# Patient Record
Sex: Male | Born: 1935 | Race: Black or African American | Hispanic: No | Smoking: Former smoker
Health system: Southern US, Community
[De-identification: ages and names within clinical notes are randomized; demographics above are authoritative.]

## PROBLEM LIST (undated history)

## (undated) DIAGNOSIS — M810 Age-related osteoporosis without current pathological fracture: Secondary | ICD-10-CM

## (undated) DIAGNOSIS — E785 Hyperlipidemia, unspecified: Secondary | ICD-10-CM

## (undated) DIAGNOSIS — F0390 Unspecified dementia without behavioral disturbance: Secondary | ICD-10-CM

## (undated) DIAGNOSIS — M6281 Muscle weakness (generalized): Secondary | ICD-10-CM

## (undated) DIAGNOSIS — J449 Chronic obstructive pulmonary disease, unspecified: Secondary | ICD-10-CM

## (undated) DIAGNOSIS — I739 Peripheral vascular disease, unspecified: Secondary | ICD-10-CM

## (undated) DIAGNOSIS — I10 Essential (primary) hypertension: Secondary | ICD-10-CM

## (undated) DIAGNOSIS — I639 Cerebral infarction, unspecified: Secondary | ICD-10-CM

## (undated) DIAGNOSIS — Z89519 Acquired absence of unspecified leg below knee: Secondary | ICD-10-CM

## (undated) DIAGNOSIS — D649 Anemia, unspecified: Secondary | ICD-10-CM

## (undated) DIAGNOSIS — R531 Weakness: Secondary | ICD-10-CM

## (undated) DIAGNOSIS — F0280 Dementia in other diseases classified elsewhere without behavioral disturbance: Secondary | ICD-10-CM

## (undated) DIAGNOSIS — I701 Atherosclerosis of renal artery: Secondary | ICD-10-CM

## (undated) DIAGNOSIS — N184 Chronic kidney disease, stage 4 (severe): Secondary | ICD-10-CM

## (undated) DIAGNOSIS — R1310 Dysphagia, unspecified: Secondary | ICD-10-CM

## (undated) DIAGNOSIS — R627 Adult failure to thrive: Secondary | ICD-10-CM

## (undated) DIAGNOSIS — H409 Unspecified glaucoma: Secondary | ICD-10-CM

## (undated) DIAGNOSIS — G309 Alzheimer's disease, unspecified: Secondary | ICD-10-CM

## (undated) HISTORY — PX: LEG AMPUTATION BELOW KNEE: SHX694

---

## 2005-05-13 ENCOUNTER — Ambulatory Visit (HOSPITAL_COMMUNITY): Admission: RE | Admit: 2005-05-13 | Discharge: 2005-05-13 | Payer: Self-pay | Admitting: Internal Medicine

## 2005-06-03 ENCOUNTER — Ambulatory Visit: Payer: Self-pay | Admitting: Internal Medicine

## 2005-06-09 ENCOUNTER — Ambulatory Visit: Admission: RE | Admit: 2005-06-09 | Discharge: 2005-06-09 | Payer: Self-pay | Admitting: Internal Medicine

## 2005-06-14 ENCOUNTER — Ambulatory Visit: Payer: Self-pay

## 2005-07-08 ENCOUNTER — Ambulatory Visit: Payer: Self-pay | Admitting: Internal Medicine

## 2005-07-16 ENCOUNTER — Ambulatory Visit: Payer: Self-pay | Admitting: Internal Medicine

## 2005-07-16 ENCOUNTER — Inpatient Hospital Stay (HOSPITAL_BASED_OUTPATIENT_CLINIC_OR_DEPARTMENT_OTHER): Admission: RE | Admit: 2005-07-16 | Discharge: 2005-07-16 | Payer: Self-pay | Admitting: Internal Medicine

## 2005-07-27 ENCOUNTER — Ambulatory Visit: Payer: Self-pay | Admitting: Internal Medicine

## 2005-08-09 ENCOUNTER — Ambulatory Visit: Payer: Self-pay | Admitting: Internal Medicine

## 2005-10-14 ENCOUNTER — Inpatient Hospital Stay (HOSPITAL_COMMUNITY): Admission: RE | Admit: 2005-10-14 | Discharge: 2005-10-19 | Payer: Self-pay | Admitting: Orthopedic Surgery

## 2005-11-08 ENCOUNTER — Inpatient Hospital Stay (HOSPITAL_COMMUNITY): Admission: EM | Admit: 2005-11-08 | Discharge: 2005-11-26 | Payer: Self-pay | Admitting: Emergency Medicine

## 2005-12-13 ENCOUNTER — Encounter (HOSPITAL_BASED_OUTPATIENT_CLINIC_OR_DEPARTMENT_OTHER): Admission: RE | Admit: 2005-12-13 | Discharge: 2006-03-13 | Payer: Self-pay | Admitting: Surgery

## 2006-01-14 ENCOUNTER — Inpatient Hospital Stay (HOSPITAL_COMMUNITY): Admission: AD | Admit: 2006-01-14 | Discharge: 2006-01-24 | Payer: Self-pay | Admitting: Orthopedic Surgery

## 2006-01-21 ENCOUNTER — Encounter (INDEPENDENT_AMBULATORY_CARE_PROVIDER_SITE_OTHER): Payer: Self-pay | Admitting: Specialist

## 2006-01-21 DIAGNOSIS — Z89519 Acquired absence of unspecified leg below knee: Secondary | ICD-10-CM

## 2006-01-21 HISTORY — DX: Acquired absence of unspecified leg below knee: Z89.519

## 2006-02-14 ENCOUNTER — Emergency Department (HOSPITAL_COMMUNITY): Admission: EM | Admit: 2006-02-14 | Discharge: 2006-02-14 | Payer: Self-pay | Admitting: Emergency Medicine

## 2007-03-29 IMAGING — CR DG CHEST 2V
1 series · 1 of 1 positions shown · non-contrast
Comparison: 10/13/2005

CLINICAL DATA: Chest pain, confusion

CHEST - 2 VIEW:

[view not recorded]
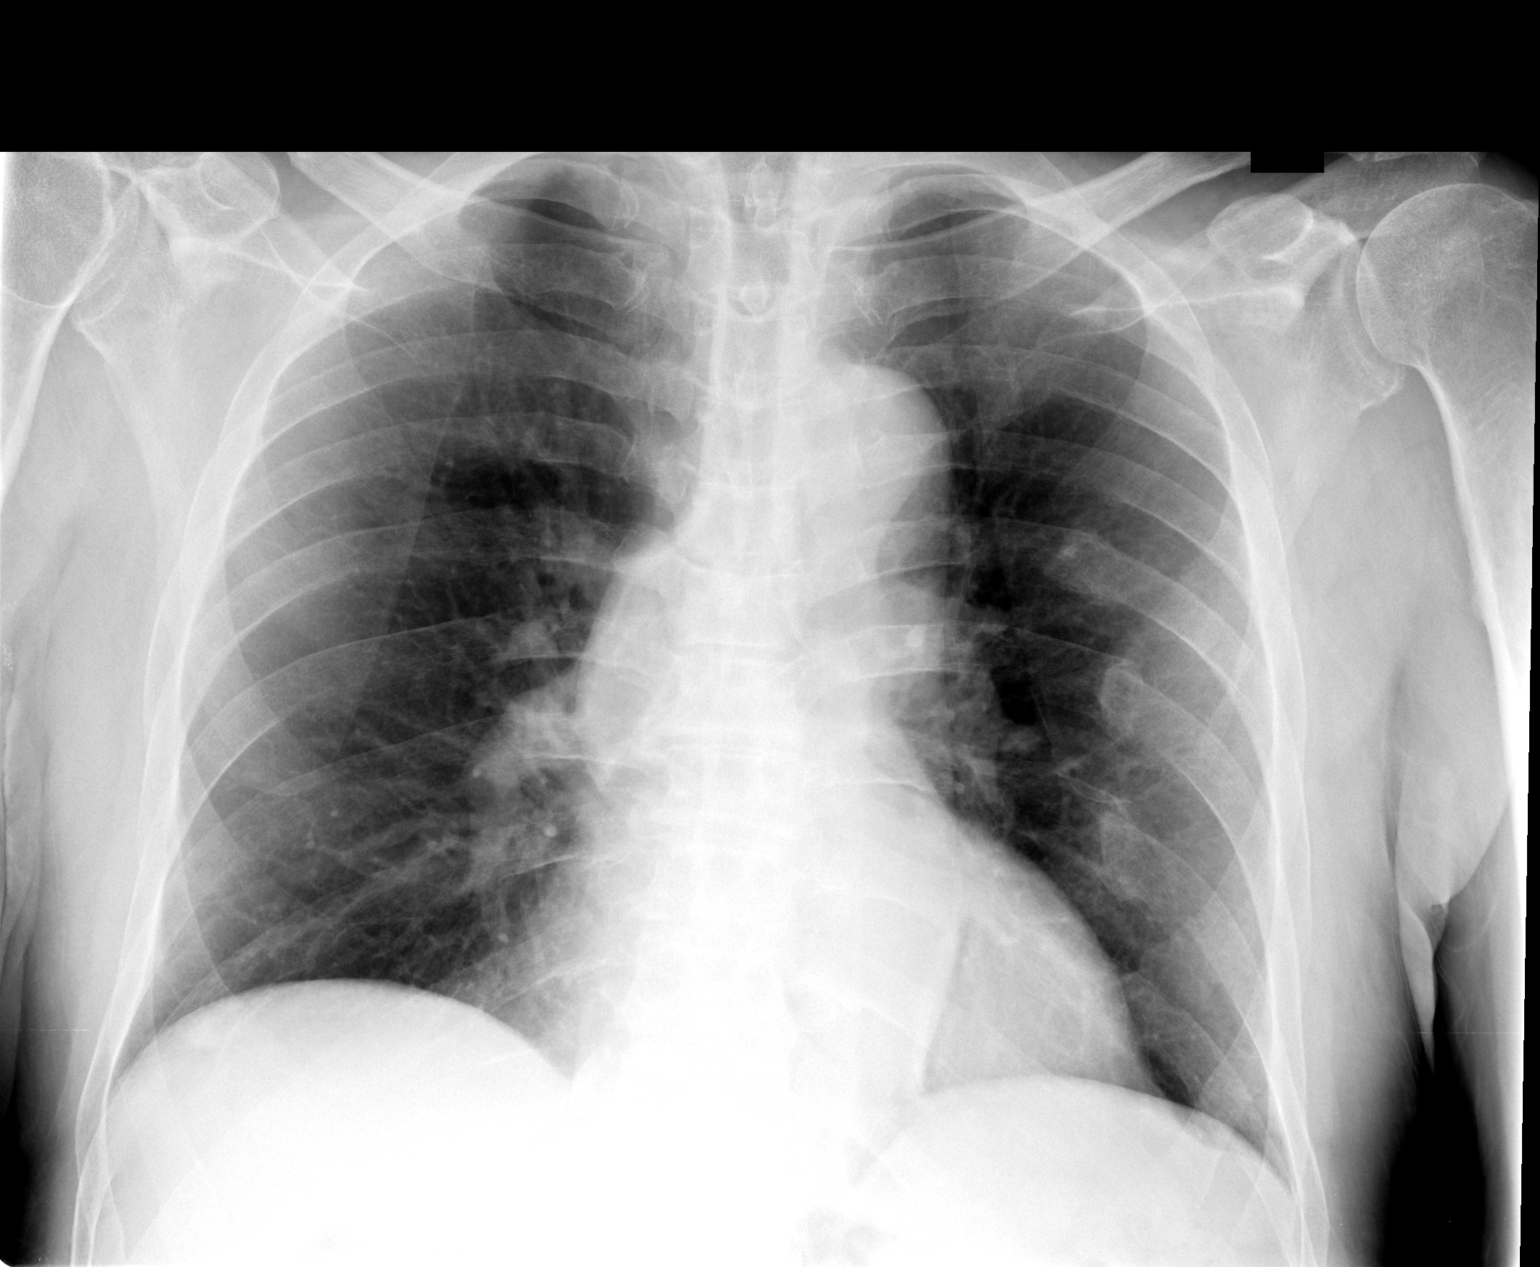

[1 of 1 positions shown; findings below may reference images not displayed]

FINDINGS: Heart is borderline enlarged. Mild tortuosity of the thoracic aorta.
No focal airspace opacities or effusions. Old left rib fractures.
IMPRESSION: Borderline cardiomegaly. No active disease.

## 2007-03-31 IMAGING — CR DG ABD PORTABLE 1V
1 series · 1 of 1 positions shown · non-contrast
Comparison: none

CLINICAL DATA: Panda placement. 
 ABDOMEN - [id number] ? 11/10/05:

[view not recorded]
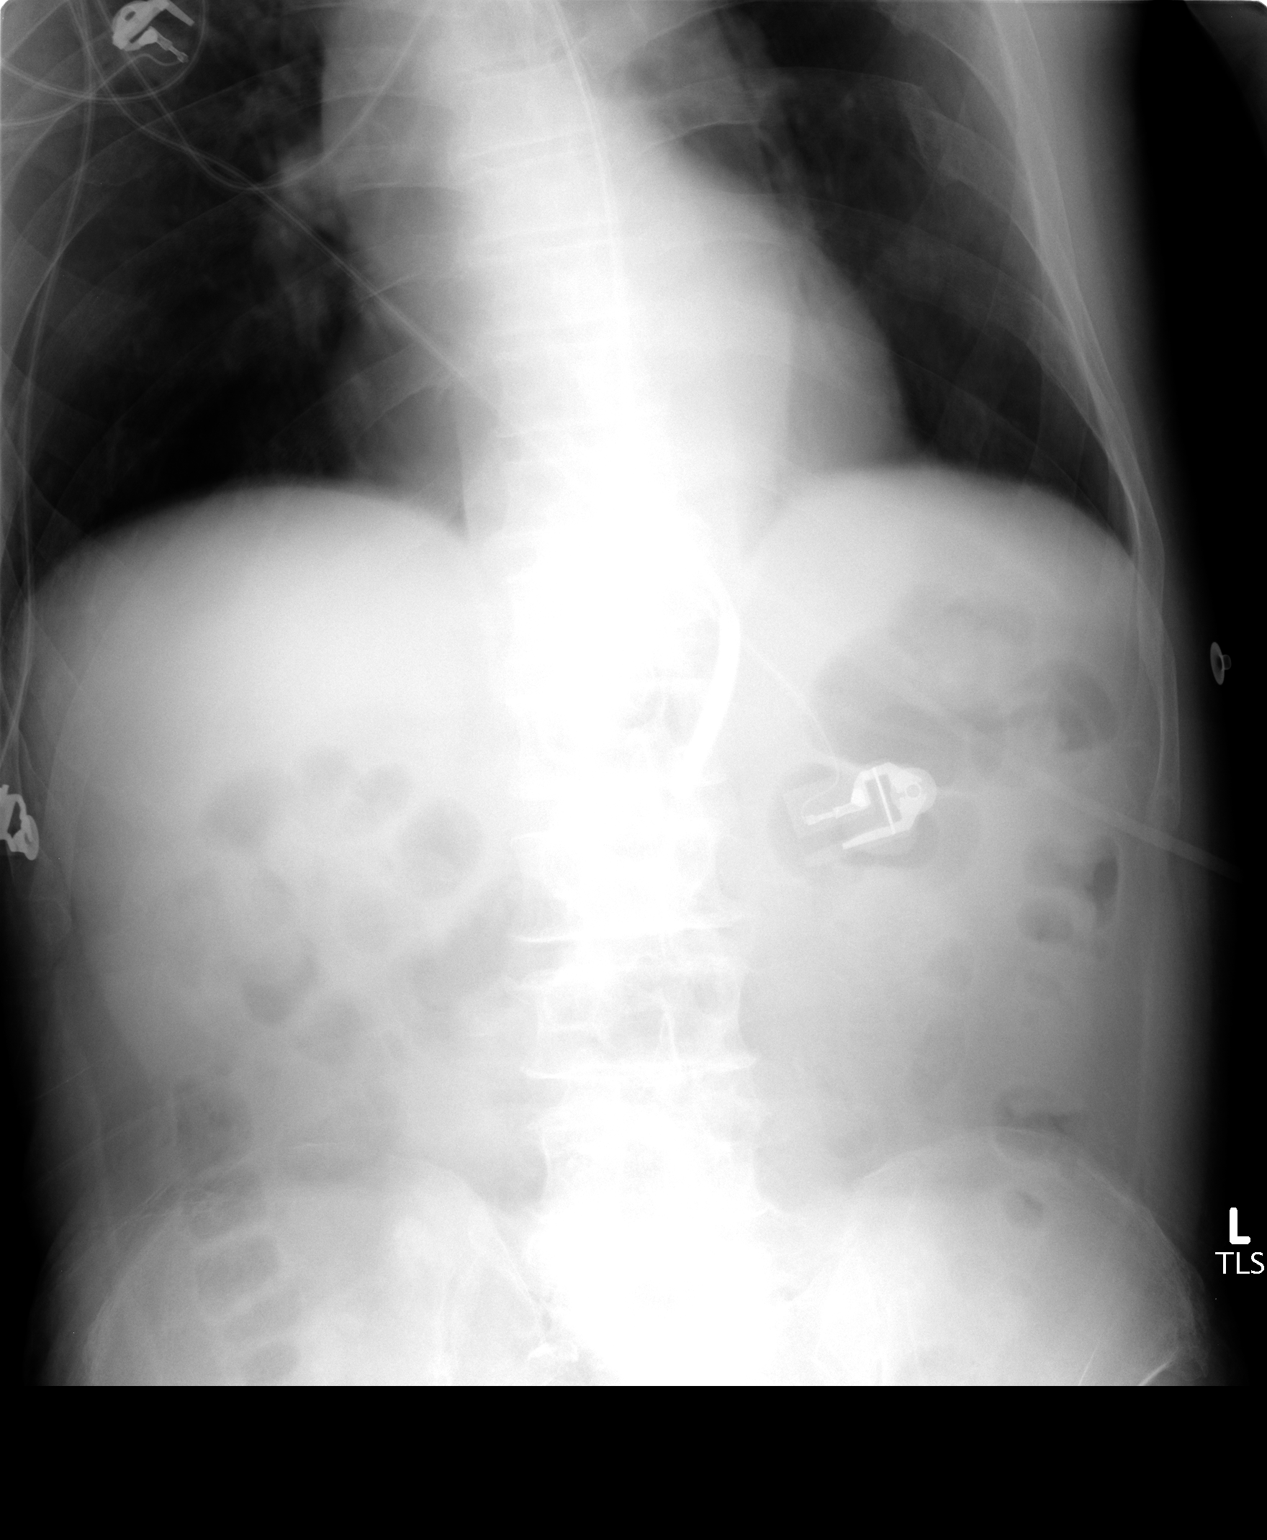

[1 of 1 positions shown; findings below may reference images not displayed]

FINDINGS: A panda tube is in place with the tip in the stomach. The tip is directed toward the duodenum.
IMPRESSION: As above.

## 2009-07-11 ENCOUNTER — Emergency Department (HOSPITAL_COMMUNITY): Admission: EM | Admit: 2009-07-11 | Discharge: 2009-07-12 | Payer: Self-pay | Admitting: Emergency Medicine

## 2010-04-11 ENCOUNTER — Emergency Department (HOSPITAL_COMMUNITY): Admission: EM | Admit: 2010-04-11 | Discharge: 2010-04-12 | Payer: Self-pay | Admitting: Emergency Medicine

## 2011-01-25 LAB — URINALYSIS, ROUTINE W REFLEX MICROSCOPIC
Bilirubin Urine: NEGATIVE
Glucose, UA: NEGATIVE mg/dL
Hgb urine dipstick: NEGATIVE
Ketones, ur: NEGATIVE mg/dL
Nitrite: NEGATIVE
Protein, ur: NEGATIVE mg/dL
Specific Gravity, Urine: 1.022 (ref 1.005–1.030)
Urobilinogen, UA: 0.2 mg/dL (ref 0.0–1.0)
pH: 6.5 (ref 5.0–8.0)

## 2011-01-25 LAB — BASIC METABOLIC PANEL WITH GFR
BUN: 27 mg/dL — ABNORMAL HIGH (ref 6–23)
Calcium: 9.4 mg/dL (ref 8.4–10.5)
Chloride: 111 meq/L (ref 96–112)
Creatinine, Ser: 1.76 mg/dL — ABNORMAL HIGH (ref 0.4–1.5)
GFR calc Af Amer: 45 mL/min — ABNORMAL LOW (ref >60)
GFR calc non Af Amer: 37 mL/min — ABNORMAL LOW (ref >60)

## 2011-01-25 LAB — MAGNESIUM: Magnesium: 2.1 mg/dL (ref 1.5–2.5)

## 2011-01-25 LAB — BASIC METABOLIC PANEL
CO2: 28 mEq/L (ref 19–32)
Glucose, Bld: 137 mg/dL — ABNORMAL HIGH (ref 70–99)
Potassium: 4.4 mEq/L (ref 3.5–5.1)
Sodium: 142 mEq/L (ref 135–145)

## 2011-01-25 LAB — URINE MICROSCOPIC-ADD ON

## 2011-02-12 LAB — COMPREHENSIVE METABOLIC PANEL
AST: 21 U/L (ref 0–37)
Albumin: 3 g/dL — ABNORMAL LOW (ref 3.5–5.2)
Alkaline Phosphatase: 53 U/L (ref 39–117)
BUN: 40 mg/dL — ABNORMAL HIGH (ref 6–23)
CO2: 29 mEq/L (ref 19–32)
Chloride: 114 mEq/L — ABNORMAL HIGH (ref 96–112)
Creatinine, Ser: 2.03 mg/dL — ABNORMAL HIGH (ref 0.4–1.5)
GFR calc Af Amer: 38 mL/min — ABNORMAL LOW (ref >60)
GFR calc non Af Amer: 32 mL/min — ABNORMAL LOW (ref >60)
Potassium: 4.4 mEq/L (ref 3.5–5.1)
Total Bilirubin: 0.5 mg/dL (ref 0.3–1.2)

## 2011-02-12 LAB — PROTIME-INR: Prothrombin Time: 14.2 seconds (ref 11.6–15.2)

## 2011-02-12 LAB — DIFFERENTIAL
Basophils Absolute: 0 10*3/uL (ref 0.0–0.1)
Basophils Relative: 0 % (ref 0–1)
Eosinophils Relative: 5 % (ref 0–5)
Monocytes Absolute: 0.5 10*3/uL (ref 0.1–1.0)

## 2011-02-12 LAB — CBC
HCT: 33.8 % — ABNORMAL LOW (ref 39.0–52.0)
MCV: 86.3 fL (ref 78.0–100.0)
Platelets: 195 10*3/uL (ref 150–400)
RBC: 3.91 MIL/uL — ABNORMAL LOW (ref 4.22–5.81)
WBC: 4.6 10*3/uL (ref 4.0–10.5)

## 2011-03-26 NOTE — Cardiovascular Report (Signed)
NAME:  Joseph Ponce, Joseph Ponce NO.:  [id number]   MEDICAL RECORD NO.:  [id number]          PATIENT TYPE:  OIB   LOCATION:  6501                         FACILITY:  [facility]   PHYSICIAN:  Arvilla Meres, M.D. LHCDATE OF BIRTH:  01-Oct-1924   DATE OF PROCEDURE:  07/16/2005  DATE OF DISCHARGE:                              CARDIAC CATHETERIZATION   PRIMARY CARE PHYSICIAN:  Barry Dienes. Eloise Harman, M.D.   CARDIOLOGIST:  Arvilla Meres, M.D.   PATIENT IDENTIFICATION:  Joseph Ponce is a very pleasant 77 year old male with  a history of hypertension and severe osteoarthritis who is pending right  knee replacement. He was referred for preoperative risk stratification.. I  saw him in the clinic, and he denied a history of known coronary disease.  Reportedly, he had a negative stress test several years ago prior to his hip  replacement. However, in the interim, he has developed some left-sided chest  pain which happens both at rest and with exertion. Thus, we performed a  Cardiolite in the office which showed an EF of 49%, and there was some  question of mild anterior ischemia. Thus, he is referred for a diagnostic  heart catheterization.   PROCEDURES PERFORMED:  1.  Selective coronary angiography.  2.  Left heart catheterization.  3.  Left ventriculogram.  4.  Abdominal aortogram.   DESCRIPTION OF PROCEDURE:  The risks and benefits of catheterization were  explained to Joseph Ponce. Consent was signed and placed on the chart. A 4-  French arterial sheath was placed in the right femoral artery. However,  given the marked tortuosity of the iliac system, we exchanged this for a  long sheath 5-French sheath. We were able to easily get up into the thoracic  aorta with a Wooley wire.  Subsequently, all catheter exchanges were made  over a high wire exchange. The left coronary system was imaged with a  standard JL-4.  Multiple catheters were tried for the right coronary;  however, given the  extreme tortuosity of the abdominal aortic iliac system,  there was very limited catheter manipulation. We finally were able to shoot  the right coronary artery nonselectively with the RCB catheter. A standard  angled pigtail was used for the ventriculogram. There were no apparent  complications. Central aortic pressure was 167/76 with a mean of 108.  The  LV pressure was 186/16 with an EDP of 26. There was no significant gradient  across the aortic valve.   Left main was long, angiographically normal.   The LAD was a long vessel which wrapped the apex. It gave off two diagonals.  There was no angiographic coronary disease.   The left circumflex was a tortuous vessel. It gave off a large branching  OM1. There was a 40% proximal lesion followed by a 30% lesion in the mid-  section.   The right coronary artery was shot nonselectively.  It gave off a small RV  branch, a small acute marginal branch, and a small to moderate size PDA.  There was no angiographic CAD.   Left ventriculogram done in the RAO approach showed  an ejection fraction of  50% with no wall motion abnormalities and no significant mitral  regurgitation.   Abdominal aorta was markedly tortuous with tortuosity extending into the  iliac system. There was an apparent 25% stenosis in the right renal artery  and no significant aortoiliac plaquing.   ASSESSMENT:  1.  Minimal nonobstructive coronary artery disease.  2.  Low normal ejection fraction with increased filling pressures.  3.  Poorly-controlled hypertension.  4.  Very tortuous abdominal aortic - iliac system with a 25% right renal      artery stenosis.   PLAN:  Continue with medical therapy and with aggressive control of his  blood pressure. To this extent, we will increase his Norvasc to 10 mg a day,  and I will see him back in the office for further titration of his  antihypertensive regimen. This is also followed by Dr. Eloise Harman. Given his  lack of  significant coronary disease, he appears to be at low risk for  cardiovascular complications with his upcoming surgery and does not need any  further cardiac testing.      Arvilla Meres, M.D. [facility]  Electronically Signed     DB/MEDQ  D:  07/16/2005  T:  07/16/2005  Job:  [id number]   cc:   Barry Dienes. Eloise Harman, M.D.  Fax: [phone]

## 2011-03-26 NOTE — Op Note (Signed)
NAME:  [name], [name] NO.:  [id number]   MEDICAL RECORD NO.:  [id number]          PATIENT TYPE:  INP   LOCATION:  5009                         FACILITY:  [facility]   PHYSICIAN:  Burnard Bunting, M.D.    DATE OF BIRTH:  1924/06/17   DATE OF PROCEDURE:  01/21/2006  DATE OF DISCHARGE:  01/24/2006                                 OPERATIVE REPORT   PREOPERATIVE DIAGNOSIS:  Left foot ischemia and calcaneal osteomyelitis.   POSTOPERATIVE DIAGNOSIS:  Left foot ischemia and calcaneal osteomyelitis.   OPERATION PERFORMED:  Left below-knee amputation.   SURGEON:  Burnard Bunting, M.D.   ASSISTANT:  None.   ANESTHESIA:  General endotracheal.   ESTIMATED BLOOD LOSS:  75 mL.   DRAINS:  None.   TOURNIQUET TIME:  34 minutes at 300 mmHg.   INDICATIONS FOR PROCEDURE:  Joseph Ponce is an 77 year old patient with  calcaneal osteomyelitis, who presents for left calcaneal osteomyelitis and  severe peripheral vascular disease, who presents for left below-knee  amputation.  The risks and benefits were discussed with the patient.   DESCRIPTION OF PROCEDURE:  The patient was brought to the operating room  where general endotracheal anesthesia was induced.  Preoperative antibiotics  were administered.  The left leg was prepped with DuraPrep solution and  draped in sterile manner.  Beginning about four fingerbreadths below the  tibial tubercle.  A circumferential incision made around the anterior aspect  of the lower leg.  The flap was carried distally.  Anterior compartment  muscles were then divided proximal to the skin incision by 1.5 cm  ___________ then divided 1 cm proximal to that with anterior flange.  Fibula  was also divided proximal to the tibial stump.  Using amputation knife, the  posterior structures were divided.  Neurovascular bundles were identified  and suture ligated x 3 with silk sutures.  The peroneal nerve and tibial  nerve were mobilized distally and cut proximally  with the electrocautery.  Tourniquet was released.  Bleeding points encountered and controlled using  electrocautery.  Some skin edge bleeding was  noted.  After thorough irrigation, the below-knee amputation stump was  closed using interrupted inverted 0 Vicryl suture to reapproximate the  fascial tissue, interrupted inverted 2-0 Vicryl to reapproximate the  subdermal layer and staples to reapproximate the skin.  The patient  tolerated the procedure well without immediate complications.           ______________________________  G. Dorene Grebe, M.D.     GSD/MEDQ  D:  03/10/2006  T:  03/10/2006  Job:  [id number]

## 2011-03-26 NOTE — H&P (Signed)
NAME:  Joseph Ponce, Joseph Ponce NO.:  [id number]   MEDICAL RECORD NO.:  [id number]          PATIENT TYPE:  INP   LOCATION:  5009                         FACILITY:  [facility]   PHYSICIAN:  Burnard Bunting, M.D.    DATE OF BIRTH:  29-Apr-1924   DATE OF ADMISSION:  01/14/2006  DATE OF DISCHARGE:                                HISTORY & PHYSICAL   CHIEF COMPLAINT:  Left foot infection.   HISTORY OF PRESENT ILLNESS:  Joseph Ponce is a 77 year old essentially non-  ambulatory patient, resident of [facility].  He underwent  right total knee replacement December 2006.  The patient has been non-  ambulatory since that time. He has had progressive ulceration of the left  heel and presents now with fevers and purulent discharge from the calcaneal  region.  The patient denies any other orthopedic complaints.   PAST MEDICAL HISTORY:  1.  Hypertension.  2.  Nonobstructive coronary artery disease with an EF of 49%.  3.  Right total knee replacement December 2006.  4.  Renal artery stenosis.  5.  Dementia.  6.  Chronic obstructive pulmonary disease.  7.  Glaucoma.   MEDICATIONS ON ADMISSION:  1.  MiraLax 17 g daily.  2.  Depakote 125 mg p.o. twice daily for mood stabilization.  3.  Prilosec 20 mg daily.  4.  Alphagan eye drops twice daily.  5.  Trusopt eye drops twice daily.  6.  Travatan eye drops nightly.  7.  Catapres 0.2 mg p.o. daily.  8.  Aspirin 81 mg p.o. daily.  9.  Norvasc 10 mg p.o. daily.  10. Zocor 20 mg daily.  11. Multivitamins daily.  12. Coumadin 1 mg p.o. daily.  13. Toprol XL 12.5 mg daily.   ALLERGIES:  No known drug allergies.   SOCIAL HISTORY:  The patient is a resident of [facility] since July 2005.  Next of kin and power of attorney is Gevena Mart  who lives in [place], telephone number [phone].   REVIEW OF SYSTEMS:  Notable for no chest pain or shortness of breath.  He  does report some fevers.   PHYSICAL EXAMINATION:  VITAL SIGNS:  Temperature 99, blood pressure 138/76,  pulse 76, respiratory rate 20, O2 saturation 99% on room air.  GENERAL:  The patient is in no acute distress.  Normal body mass index.  NECK:  No jugular venous distention or carotid bruits and HEENT exam.  HEART:  Regular rate and rhythm without murmurs.  CHEST: Clear to auscultation.  ABDOMEN: Benign.  EXTREMITIES: Right lower extremity demonstrates anterior knee incision with  reasonable range of motion without effusion.  He has non-palpable pulses  bilaterally. Bilateral lower extremities show chronic vascular changes.  On  the right-hand side, he has an early grade I ulcer on the heel with no  drainage or erythema.  He has good dorsiflexion nd plantar flexion.  Strength is __________  The patient has foul-smelling decubitus ulcer down  to the calcaneus on the left heel.  There is proximal lymphadenopathy.  He  has chronic venous stasis changes of the skin.  Pedal pulses are not  palpable.   Radiograph showed no overt osteomyelitis with bony destruction, although the  ulceration is probed down to bone.   Laboratory values include white of 13.6, hematocrit 26.4, platelets 420.  INR 3.4.  Sodium 146, potassium 4, chloride 115, CO2 20, glucose 129, BUN  42, creatinine 1.2, albumin 2.9.   IMPRESSION:  Bilateral lower extremity peripheral vascular disease with left  calcaneal osteomyelitis and infection.   PLAN:  1.  Admission for IV antibiotics and below-knee amputation.  2.  We will wait for patient's INR to decrease before proceeding with below-      knee amputation.           ______________________________  G. Dorene Grebe, M.D.     GSD/MEDQ  D:  01/15/2006  T:  01/16/2006  Job:  [id number]

## 2011-03-26 NOTE — H&P (Signed)
NAMEMarland Kitchen  Joseph Ponce, Joseph Ponce NO.:  [id number]   MEDICAL RECORD NO.:  [id number]          PATIENT TYPE:  INP   LOCATION:  4740                         FACILITY:  [facility]   PHYSICIAN:  Barry Dienes. Eloise Harman, M.D.DATE OF BIRTH:  1924-03-04   DATE OF ADMISSION:  11/08/2005  DATE OF DISCHARGE:                                HISTORY & PHYSICAL   PERTINENT FINDINGS:  The patient is a 77 year old African American man who  has been a resident of the [facility]  with a history of hypertension, dementia, recent right total knee  replacement, who was transported to the emergency room for evaluation of  elevated serum sodium at [facility].  He had a total knee replacement on  October 17, 2005.  Since that time, he has eaten and drank very little  fluids.  He was seen by the facility physician on November 05, 2005.  We  started IV fluids at that time.  Unfortunately, the IV failed, and he was  awaiting placement of a PICC lines.  Labs drawn at the facility on the day  of admission revealed a serum sodium of 173 with BUN of 51, creatinine 1.6.  He was transported to the emergency room for evaluation.  He did appear  confused but denied any specific complaints.   PAST MEDICAL HISTORY:  1.  Hypertension.  2.  Nonobstructive coronary artery disease on cardiac catheterization in      August 2006 with left ventricular ejection fraction of 49%, status post      right total knee replacement in December 2006.  3.  Severe osteoarthritis.  4.  Renal artery stenosis.  5.  Dementia.  6.  Chronic obstructive pulmonary disease.  7.  Glaucoma.   MEDICATIONS PRIOR TO ADMISSION:  1.  MiraLax 17 g daily.  2.  Depakote 125 mg p.o. b.i.d. for mood stabilization.  3.  Omeprazole 20 mg daily.  4.  Alphagan eye drops twice daily.  5.  Trusopt eye drops twice daily.  6.  Travatan eye drops at h.s.  7.  Catapres 0.2 mg daily.  8.  Aspirin 325 mg daily.  9.  Norvasc  10 mg daily.  10. Zocor 20 mg daily.  11. Multivitamin.  12. Coumadin 2 mg daily.  13. Toprol XL 12.5 mg daily.   ALLERGIES:  No known drug allergies.   SOCIAL HISTORY:  He has been a resident of the [facility] since July 2005.  His next of kin and power-of-attorney is Rhona Raider who lives in [place] and telephone is [phone].   PHYSICAL EXAMINATION:  VITAL SIGNS:  Temperature 98.2, pulse 72,  respirations 20, blood pressure 134/74.  GENERAL:  He was a dehydrated appearing man with bitemporal wasting who had  moderately severe dysarthria.  HEENT:  Exam was significant for severely dry oropharynx.  NECK:  Supple without jugular venous distention or carotid bruits.  HEART:  Regular rate and rhythm without significant murmur or gallop.  CHEST:  Clear to auscultation.  ABDOMEN:  Benign.  EXTREMITIES:  The extremities  had chronic vascular changes.  He had  bilateral heel decubitus ulcers with eschar and several areas of dry  gangrene on the toes of his left foot.  NEUROLOGICAL:  He was combative, oriented to city but not place or year.  He  was uncooperative with the exam.   LABORATORY DATA:  Serum sodium 173, potassium 3.8, chloride 137, bicarbonate  26, BUN 51, creatinine 1.6, glucose 111.  Serum albumin 3.3.  Liver  associated enzymes normal.  INR 6.9.  The chest x-ray showed cardiomegaly  with no acute cardiopulmonary disease.   HOSPITAL COURSE:  The patient was admitted to a medical bed without  telemetry.  Due to poor IV access, he had a PICC line placed in the right  upper extremity on the day of admission.  On November 09, 2004, he had a  modified barium swallow study that showed very severe oropharyngeal  dysphagia with pocketing of food in his mouth and no effective swallowing.  On November 10, 2005, a PANDA tube was inserted and pulled out later that day  by the patient.  On November 12, 2005, he had an MRI scan of the brain which   showed extensive cerebral and cerebellar atrophy with small vessel ischemic  changes and no acute intracranial abnormalities.  He had his  supratherapeutic INR reversed with vitamin K.  He had no unusual bleeding  during his stay.  He was seen by wound ostomy and incontinence nurse who  recommended topical treatment to his gangrenous areas on the left foot and  use of posterior ankle foot orthosis bilaterally.  He was given large  volumes of IV fluids and slowly his electrolyte abnormalities normalized.  On November 14, 2005, his PANDA tube was inserted by fluoroscopic guidance and  then subsequently pulled out by the patient.  On November 16, 2005, he had  bilateral arterial ultrasound study of the lower extremities that showed an  ABI on the right of 0.39 and 0.70 on the left with dampened bilateral  dorsalis pedis pulses and posterior tibial pulses.  He developed fever  during his stay with blood and urine cultures unremarkable.  He had a wound  culture from the left foot showing moderate methicillin-resistant  Staphylococcus aureus.  It was sensitive to clindamycin, erythromycin,  gentamicin, rifampin, Septra, vancomycin and tetracycline.  The organism was  resistant to levofloxacin and oxacillin.  He was also seen by an orthopedic  consultant who noted the absence of pedal pulses and recommended a vascular  surgery evaluation.  On November 23, 2004, an abnormal aortogram and  bilateral lower extremity runoff arteriogram was performed that was  significant for bilateral severe disease at the trifurcations with very poor  distal runoff and no surgical options for revascularization.  It was  recommended that we continue the current care and that if he develops  progressive gangrene with pain to his lower extremities an amputation could  be performed.  He also had transfusion of two units of packed red blood  cells on November 17, 2005.  X-rays of the left showed no evidence of fracture, foreign  body or osteomyelitis.  A repeat modified barium swallow  test on November 24, 2005, showed a significant improvement in his ability to  swallow, and he was changed to a D2 diet with thin liquids and small sips  and full supervision with meals.  He continued to improve.  The plan for him  is to continue his current care, allowing his blood pressures to run  somewhat high to optimize his minimal perfusion to his feet.   PROCEDURES:  1.  PICC line placement right upper extremity.  2.  Modified barium swallow.  3.  Fluoroscopically guided PANDA tube placement.  4.  MRI scan of the brain.  5.  Arterial ultrasound study of both lower extremities.  6.  Transfusion of two units of packed red blood cells.  7.  Aortogram and bilateral lower extremity arterial angiogram.   COMPLICATIONS:  None.   DISCHARGE CONDITION:  He is comfortable with clear mentation while lying in  bed.  He was in no apparent distress while sitting in bed.  Chest was clear  to auscultation.  Heart had a regular rate and rhythm without significant  murmur or gallop.  The abdomen was benign.  He had a small amount of soft  brown stool.  Extremities were without edema in the legs.  There was a  chronic contracture of the left hand.  There was no change in multiple areas  of dry gangrene on the toes of the left foot with a moderate amount of  necrotic tissue on the toes and in between the toes.  On neurological exam,  he is alert and well oriented.  He is able to move his lower extremities.  He has 4/5 left upper extremity strength.  Most recent vital signs include  blood pressure of 165/94, pulse 84, respirations 20, temperature 98.7, pulse  oxygen saturation 97% on room air.  Most recent laboratory tests include  white blood cell of 9.1, hemoglobin 8.3, hematocrit 24.6, platelets 341,000.  Serum 139, potassium 4.5, chloride 110, carbon dioxide 24, BUN 1, creatinine  1.0, glucose 85, total protein 5.5, albumin 2.1.    DISCHARGE DIAGNOSES:  1.  Altered mental status.  2.  Severe hypernatremia and dehydration.  3.  Malnutrition, severe.  4.  Anemia.  5.  Severe peripheral vascular disease in both legs with limb threatening      ischemia.  6.  Areas of dry gangrene on the toes of the left foot.  7.  Hypertension, essential, controlled.  8.  Hyperlipidemia.  9.  Esophageal reflux disease.  10. Glaucoma.  11. Constipation.  12. Nonobstructive coronary artery disease.  13. 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  14. Fecal incontinence.  15. Status post recent right total knee replacement, high risk for deep vein      thrombosis.  16. Methicillin-resistant staphylococcus aureus infection of the left foot      without x-ray evidence of osteomyelitis.  17. Osteoarthritis.   DISCHARGE MEDICATIONS:  1.  Prilosec 20 mg p.o. daily.  2.  Aspirin 325 mg p.o. daily.  3.  Norvasc 10 mg p.o. daily.  4.  Zocor 20 mg p.o. daily.  5.  Toprol XL 12.5 mg p.o. daily.  6.  Alphagan 0.2% to both eyes twice daily. 7.  Trusopt 2% one drop to both eyes twice daily.  8.  Travatan 0.004% one drop to both eyes q.h.s.  9.  Lactulose 20 g/30 mL.  Take 30 mL p.o. b.i.d.  10. Nitro-Dur 0.2 mg patch q.a.m., remove q.h.s.  11. DD AVD 0.01% one spray intranasally t.i.d.  12. _____ cream to the buttocks and perineum once daily.  13. Catapres TTS 0.1 mg one patch once weekly.  14. Depakote 125 mg p.o. q.h.s.  15. Lovenox 40 mg subcutaneously once daily until INR test is greater than      2.0.  16. Coumadin per pharmacist protocol to start at 2 mg daily.  17. Vancomycin 1250 mg IV every 24 hours for the next 10 days.  18. Ensure pudding t.i.d.  19. Multivitamin one tablet daily.  20. Vitamin C 500 mg p.o. b.i.d.  21. Zinc sulfate 220 mg p.o. daily.  22. Tylenol 650 mg p.o. t.i.d. p.r.n. pain.  23. Panafil ointment to necrotic areas of both feet and in between toes once      daily and then cover with gauze.   SPECIAL INSTRUCTIONS:   He should have Prafo boots kept on both feet at all  times.  He should have rehabilitation by physical therapy, occupational  therapy and speech language pathology at the skilled nursing facility.  His  Foley catheter can be discontinued upon arrival at the skilled nursing  facility.  He will be seen by the attending physician at the skilled nursing  facility.   Please note that the process of discharge required 45 minutes.           ______________________________  Barry Dienes Eloise Harman, M.D.     DGP/MEDQ  D:  11/25/2005  T:  11/25/2005  Job:  [id number]

## 2011-03-26 NOTE — Discharge Summary (Signed)
NAMEMarland Ponce  [name], [name] NO.:  [id number]   MEDICAL RECORD NO.:  [id number]          PATIENT TYPE:  INP   LOCATION:  5021                         FACILITY:  [facility]   PHYSICIAN:  Nadara Mustard, MD     DATE OF BIRTH:  [birth year]   DATE OF ADMISSION:  10/14/2005  DATE OF DISCHARGE:  10/19/2005                                 DISCHARGE SUMMARY   DIAGNOSIS:  Osteoarthritis, right knee.   PROCEDURE:  Right total knee arthroplasty.   DISCHARGE:  To [facility] in stable condition.   DISCHARGE MEDICATIONS:  As per his medical reconciliation form.  Coumadin 1  mg p.o. daily for one month for DVT prophylaxis.  Pain medication:  Vicodin  one to two p.o. q.4h. p.r.n. for pain.   PHYSICAL THERAPY:  Progressive ambulation, weightbearing as tolerated on the  right.  No knee immobilizer.  Ensure that a pillow is not kept under the  knee to allow for full extension when lying in bed, and physical therapy for  range of motion of the right knee.   Harvest the staples from the right knee in one week.  Follow up with Dr.  Lajoyce Corners in four weeks.   HISTORY OF PRESENT ILLNESS:  The patient is a 77 year old gentleman with  tricompartmental osteoarthritis of his right knee.  The patient had failed  conservative care, was unable to perform activities of daily living due to  right knee pain and presented at this time for a total knee arthroplasty.   The patient's hospital course was essentially unremarkable.  He underwent a  right total knee arthroplasty with DePuy components on October 14, 2005.  He  received a #5 tibia, a #5 femur, a 10 mm poly tray, with a 41 mm patella.  He received Kefzol for infection prophylaxis and tourniquet time was 45  minutes.  Postoperatively the patient's course was essentially unremarkable.  His hemoglobin dropped to a low of 7.9 on December 9.  He received one units  of packed red blood cells and increased to 8.2.  The patient progressed  slowly with  physical therapy and was felt to be safe for discharge to  skilled nursing in stable condition on December 12 with follow-up in the  office in four weeks.      Nadara Mustard, MD  Electronically Signed     MVD/MEDQ  D:  10/19/2005  T:  10/19/2005  Job:  [id number]

## 2011-03-26 NOTE — Op Note (Signed)
NAMEMarland Kitchen  Joseph Ponce, Joseph Ponce NO.:  [id number]   MEDICAL RECORD NO.:  [id number]          PATIENT TYPE:  INP   LOCATION:  5021                         FACILITY:  [facility]   PHYSICIAN:  Nadara Mustard, MD     DATE OF BIRTH:  [birth year]   DATE OF PROCEDURE:  10/14/2005  DATE OF DISCHARGE:                                 OPERATIVE REPORT   PREOPERATIVE DIAGNOSIS:  Osteoarthritis, right knee.   POSTOPERATIVE DIAGNOSIS:  Osteoarthritis, right knee.   PROCEDURE:  Right total knee arthroplasty with DePuy components, #5 tibia,  #5 femur, 10-mm posterior stabilized polyethylene tray with a 41-mm patella.   SURGEON:  Nadara Mustard, MD   ANESTHESIA:  General.   ESTIMATED BLOOD LOSS:  Minimal.   ANTIBIOTICS:  One gram of Kefzol.   DRAINS:  None.   COMPLICATIONS:  None.   TOURNIQUET TIME:  Forty-five minutes at 300 mmHg.   DISPOSITION:  To PACU in stable condition.   INDICATION FOR PROCEDURE:  The patient is a 77 year old gentleman with  osteoarthritis of his right knee.  The patient complains of pain with  activities of daily living, states he has failed conservative care and  presents at this time for total knee arthroplasty.  The risks and benefits  are discussed including infection, neurovascular injury, persistent pain,  nonhealing of the wound, DVT, pulmonary embolus.  The patient states he  understands and wishes to proceed at this time.   DESCRIPTION OF PROCEDURE:  The patient was brought to OR room 4 and  underwent a general anesthetic.  After an adequate level of anesthesia was  obtained, the patient's right lower extremity was prepped using DuraPrep and  draped in a sterile field;  an [place] was used to cover all exposed skin.  The knee was flexed and an incision was made over the knee approximately 6  inches in length.  The tourniquet was inflated to 300 mmHg.  A medial  parapatellar retinacular incision was made and this was carried down the  knee.  The  patella was everted and a starting hole was made in the femur.  The femoral IM rod was inserted and the femoral component was set to take 11  mm off the distal femur.  The distal femoral cut was made, the femur sized  for size 5; this was then set for a size 5 and the chamfer cuts were made  for the size 5.  There was no notching dorsally.  Attention was then focused  on the tibia.  The tibial external alignment guide was set for a neutral  varus and valgus and neutral posterior slope.  This was set to take 10 mm  off the medial tibial plateau.  The cut was made, extensive and flexion gap  spaces were checked and were balanced.  The tibial tray was then inserted  with the tower keel punch and the keel was inserted with the trial size 5  tibia.  The box cut was then made on the femur.  The femoral trial was  placed; size 10 poly was placed  and the knee was placed through a range of  motion.  The patient had full range of motion.  Varus and valgus stress were  stable.  The femoral component was then drilled for the lugs for the femoral  component.  The tibial and femoral components were removed.  The patella was  resurfaced to take 10 mm off the patella.  This sized for a 41 and the peg  cuts were made for a size 41.  The wound was then irrigated with pulse  lavage.  The cement was mixed.  The tibial and femoral components were  inserted.  The excess cement was removed.  The tibial tray was inserted  after again pulse lavage was performed and the knee was then kept in  extension until the cement had hardened.  The patellar component was then  also cemented in place and this was left in extension with the clamp in  place until the cement hardened.  The clamp was removed.  Again, the knee  was irrigated with pulse lavage.  The knee was placed through a full range  of motion; there was no laxity or subluxation of the patella.  The varus and  valgus stress were stable.  The retinacula was then  closed using #1 Vicryl,  subcu was closed using 2-0 Vicryl and the skin was closed using approximated  staples.  The wound was covered Adaptic, orthopedic sponges, sterile Webril  and a Coban dressing.  The patient was placed in an ice pack, extubated, and  taken to PACU in stable condition.      Nadara Mustard, MD  Electronically Signed     MVD/MEDQ  D:  10/14/2005  T:  10/15/2005  Job:  [id number]

## 2011-03-26 NOTE — H&P (Signed)
NAME:  SUMMIT, ARROYAVE NO.:  [id number]   MEDICAL RECORD NO.:  [id number]          PATIENT TYPE:  INP   LOCATION:  1828                         FACILITY:  [facility]   PHYSICIAN:  Kari Baars, M.D.  DATE OF BIRTH:  Apr 16, 1924   DATE OF ADMISSION:  11/08/2005  DATE OF DISCHARGE:                                HISTORY & PHYSICAL   CHIEF COMPLAINT:  Elevated sodium level, altered mental status.   HISTORY OF PRESENT ILLNESS:  Mr. Molesworth is a 77 year old, African-American  male resident of [facility] nursing facility with a history of hypertension,  dementia, and recent right total knee replacement, who was transported to  the emergency department for evaluation of elevated sodium at [facility].  I  spoke with the nurse this afternoon, who reports that he has had an altered  mental status with increasing lethargy since his total knee replacement on  October 17, 2005.  She states that he was a new man when he returned from  this procedure.  He was seen and evaluated by Dr. Eloise Harman on December 29th,  who also noted lethargy and failure to thrive.  At that time, IV fluids were  ordered.  Subsequent lab work was also obtained.  Unfortunately, they have  been unable to obtain IV access, and the patient was scheduled to have a  PICC line placed tomorrow.  However, labs were drawn today and revealed a  sodium of 173, BUN 51, creatinine 1.6.  The patient is currently without  complaint and is not sure why he is in the emergency department.  Initially,  he did refuse a lab draw.  He does appear confused but denies any specific  complaints.   REVIEW OF SYSTEMS:  Limited by the patient's altered mental status.  The  patient does deny all symptoms when asked.   PAST MEDICAL HISTORY:  1.  Hypertension.  2.  Nonobstructive coronary artery disease on cardiac cath (August 2006)      with an ejection fraction of 49%.  3.  Status post right total knee replacement (October 14, 2005).  4.  Severe osteoarthritis.  5.  A 25% renal artery stenosis.  6.  Dementia.  7.  COPD.  8.  Glaucoma.   MEDICATIONS:  1.  MiraLax 17 gm daily.  2.  Depakote recently decreased from 250 mg b.i.d. to 125 mg b.i.d. on      December 28th.  3.  Omeprazole 20 mg daily.  4.  Alphagan eye drops 0.2% b.i.d.  5.  Trusopt eye drops 2% b.i.d.  6.  Travatan 0.004% eye drops q.h.s.  7.  Catapres 0.2 mg weekly.  8.  Aspirin 325 mg daily.  9.  Norvasc 10 mg daily.  10. Zocor 20 mg daily.  11. Multivitamin.  12. Coumadin 2 mg daily.  13. Toprol-XL 12.5 mg daily.  14. Lasix was discontinued on December 28th.   ALLERGIES:  No known drug allergies.   SOCIAL HISTORY:  He is a resident of [facility] since July 2005.  I have  attempted to reach the next of kin, but  the phone numbers listed on the  [facility] flow sheet are incorrect.  The nurses at [facility] said they  were also having difficulty reaching family members.   FAMILY HISTORY:  Unable to obtain due to the patient's mental status.   PHYSICAL EXAMINATION:  VITAL SIGNS:  Temperature 98.2, pulse 72,  respirations 20, blood pressure 134/74.  GENERAL:  A wasted, severely dehydrated, African-American male.  He is  dysarthric.  HEENT:  Oropharynx is severely dry.  Sclerae are muddy bilaterally without  icterus.  NECK:  Supple without lymphadenopathy, JVD, or carotid bruits.  HEART:  Regular rate and rhythm without murmurs, rubs, or gallops.  LUNGS:  Clear to auscultation bilaterally.  ABDOMEN:  Soft, nondistended, nontender, with normoactive bowel sounds.  EXTREMITIES:  Chronic vascular disease changes with darkened,  hyperpigmented, atrophied toes.  He is unwilling to allow the removal of a  left foot dressing and becomes combative with the exam.  NEUROLOGIC:  Combative, oriented to city, not place or year.  He is able to  be redirected, but noncooperative.   LABORATORY DATA:  Labs obtained this morning at [facility] include a  BMET  with a sodium of 173, potassium 3.8, chloride 137, bicarb 26, BUN 51,  creatinine 1.6, glucose 111.  Albumin 3.3.  Liver function tests were  normal.  INR 6.9.   STUDIES:  Chest x-ray in the emergency department shows cardiomegaly with no  acute cardiopulmonary disease.   ASSESSMENT/PLAN:  1.  Altered mental status secondary to severe metabolic derangements      including hypernatremia and acute renal failure - the severe      hypernatremia is likely due to poor p.o. intake and severe dehydration.      He will require admission for intravenous placement, intravenous fluid      hydration, and close monitoring of his sodium level.  His sodium is      dangerously high and there is a risk of cerebral edema with over      correction.  We will place Foley catheter and monitor in's and out's      carefully.  Rule out occult infection with urinalysis and treat      empirically with Rocephin while awaiting blood cultures and urine      cultures.  2.  Acute renal failure secondary to prerenal azotemia - follow up renal      function with hydration.  Obtain urinalysis.  3.  Hypertension - continue Clonidine, Toprol, and Norvasc.  Lasix was      recently held and this will continue to be held.  4.  Supratherapeutic INR - likely due to his acute illness.  We will hold      Coumadin.  Consider restarting for deep venous thrombosis prophylaxis      once his INR is less than 3.  5.  Ethics - the flow sheet from [facility] does list him as a Full Code.      He is not capable of making this decision      at this point.  Family members are not available.  Therefore, we will      Michaeline Eckersley to a Full Code status.  For this reason, he will be admitted to      a telemetry bed.  6.  Disposition - consider discharge back to [facility] with PICC line if he      shows improvement.      Kari Baars, M.D.  Electronically Signed    WS/MEDQ  D:  11/08/2005  T:  11/08/2005  Job:  [id number]

## 2011-03-26 NOTE — Consult Note (Signed)
NAMEMarland Ponce  OLUWATIMILEHIN, BALFOUR NO.:  [id number]   MEDICAL RECORD NO.:  [id number]          PATIENT TYPE:  INP   LOCATION:  4740                         FACILITY:  [facility]   PHYSICIAN:  Theresia Majors. Tanda Rockers, M.D.DATE OF BIRTH:  04-Sep-1924   DATE OF CONSULTATION:  12/20/2005  DATE OF DISCHARGE:  11/26/2005                                   CONSULTATION   REASON FOR CONSULTATION:  Bilateral lower extremity ulcerations.   IMPRESSION:  Combined pressure and arterial insufficiency.   RECOMMENDATIONS:  Multiple ulcerations of the left foot were performed in  the clinic and moist moist dressings applied with resumption of protective  footwear.  We will see the patient back at weekly intervals with the purpose  of performing serial debridements. He will ultimately require arterial  screening which may involve arteriography.   SUBJECTIVE:  Joseph Ponce is an 77 year old man who is a resident of the  [facility] nursing home. He was noted to have breakdown of both feet several  weeks ago and was treated by the in-house wound care service. He is referred  to the wound center for comprehensive evaluation. His past medical history  is remarkable for hypertension, dementia, coronary disease, severe  osteoarthritis, glaucoma, COPD, esophageal reflux and a history of diabetes  insipidus. His past surgery has included a total knee replacement in  December 2006. He denies allergies.   CURRENT MEDICATION LIST:  1.  Zinc sulfate 220 mg p.o. q.d.  2.  Tylenol.  3.  Prilosec 20 mg daily.  4.  Aspirin once a day.  5.  Norvasc 10 mg daily.  6.  Zocor 20 mg daily.  7.  Toprol XL 12.5 mg daily.  8.  Alphagan and 0.2% drops to both eyes b.i.d.  9.  Trusopt  2% drops to both eyes b.i.d.  10. __________ 0.004% drops h.s.  11. Lactulose 20 grams in 30 mL of water and b.i.d.  12. Nitro-Dur 0.2 mg patch in the morning.  13. Depakote 125 mg q.h.s.  14. Lovenox 40 mg subcu daily.  15. Coumadin.  16.  Vancomycin in 1250 mg IV q.12 h.  17. Multiple vitamin daily.  18. Vitamin C.  19. Remeron 30 q.h.s.   PRIMARY CARE PHYSICIAN:  Dr. Jarold Motto.   FAMILY HISTORY:  Is not obtainable. We have discerned from his record and  also what history we could discerned from the patient is that he is an 3-  year-old man who is the sole survivor of his family. He is in the [facility]  nursing home. He is a widow. He has a daughter the whereabouts is unknown at  present.   REVIEW OF SYSTEMS:  Discloses that he spends most of his time in a  wheelchair. He is nonambulatory. He denies chest pain. He has been  complaining of some pain in his left lower extremity.   PHYSICAL EXAM:  GENERAL:  He is an elderly man in no acute distress. He  responds spontaneously but is disoriented to place but oriented to person.  HEENT:  Exam was clear.  NECK:  Supple. Trachea  is midline. Thyroid is nonpalpable.  LUNGS:  Clear.  ABDOMEN:  The abdomen is soft.  EXTREMITIES:  The extremity exam is abnormal. There is a full-thickness  necrosis to the left heel. This wound was photographed and placed into the  wound expert. 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 The first toe of the left foot along with the second,  third and fourth toes have distal necrosis and sloughing. The third toe has  a full-thickness necrosis extending down to and involving the distal  phalanx. All of these areas were debrided, irrigated with saline and moist  moist dressings were applied. The right foot had well-healed blisters with a  residual scab which was excised. The dorsalis pedis pulse was not  appreciable in either extremity but there was no old appreciation of  coolness or pallor.  NEUROLOGIC:  Neurologically the patient continued to demonstrate protective  sensation.   DISCUSSION:  This elderly nonambulatory man  has lesions consistent with  pressure necrosis and risk factors consistent with a significant occlusive  vascular disease. Our initial steps at wound debridement to clean the wounds  and to initiate a program of wound hygiene have been initiated. We will see  the patient in 1 week to assess his response to therapy. We have deferred  vascular evaluation at this time pending a reevaluation of this wound over  time. We have explained this approach to the accompanying nursing home  personnel and we forwarded documents stating the same. We will see the  patient in one week.           ______________________________  Theresia Majors. Tanda Rockers, M.D.     Cephus Slater  D:  12/20/2005  T:  12/21/2005  Job:  [id number]

## 2011-03-26 NOTE — Discharge Summary (Signed)
NAME:  Joseph Ponce, FOWLE NO.:  [id number]   MEDICAL RECORD NO.:  [id number]          PATIENT TYPE:  INP   LOCATION:  5009                         FACILITY:  [facility]   PHYSICIAN:  Burnard Bunting, M.D.    DATE OF BIRTH:  [birth year]   DATE OF ADMISSION:  01/14/2006  DATE OF DISCHARGE:  01/24/2006                                 DISCHARGE SUMMARY   DISCHARGE DIAGNOSIS:  Left foot infection.   SECONDARY DIAGNOSES:  1.  Hypertension.  2.  Non-obstructive coronary artery disease.  3.  Right total knee replacement December 2006.  4.  Renal artery stenosis.  5.  Dementia.  6.  Chronic obstructive pulmonary disease.  7.  Glaucoma.   OPERATIONS/PROCEDURES/TREATMENT:  Left below the knee amputation performed  January 21, 2006.   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 He was admitted on January 14, 2006. At that time, his INR was elevated to 3.8. He was treated with  stoppage of Coumadin as well as IV Vitamin K. His INR was deemed therapeutic  on January 20, 2006 and he underwent BKA on January 21, 2006. The patient was  maintained on IV antibiotics during his course. He was started on Lovenox  post amputation for DVT prophylaxis. The patient had an otherwise  unremarkable recovery. His hematocrit was 29.5 and his creatinine was normal  at the time of discharge. His stump was intact at the time of discharge.   DISCHARGE MEDICATIONS:  1.  MiraLax 17 grams daily.  2.  Depakote 125 mg p.o. twice a day for mood stabilization.  3.  Prilosec 20 daily.  4.  Alphagan eye drops twice daily.  5.  Trusopt eye drops twice daily.  6.  Travatan eye drops nightly.  7.  Catapres 0.2 mg p.o. daily.  8.  Coumadin 5 mg p.o. daily until INR 2 to 2.5.  9.  Zocor 20 mg daily.  10. Multivitamins daily.  11. Toprol XL 12.5 mg p.o. daily.  12.  Lovenox 40 mg subcutaneous q.24 hours.  13. Nitroglycerin topically.   FOLLOW UP:  He will followup with me in 7 days for suture removal.           ______________________________  G. Dorene Grebe, M.D.     GSD/MEDQ  D:  01/24/2006  T:  01/24/2006  Job:  [id number]

## 2011-07-10 DEATH — deceased

## 2011-08-23 ENCOUNTER — Other Ambulatory Visit (HOSPITAL_BASED_OUTPATIENT_CLINIC_OR_DEPARTMENT_OTHER): Payer: Self-pay | Admitting: Internal Medicine

## 2011-08-26 ENCOUNTER — Ambulatory Visit (HOSPITAL_COMMUNITY)
Admission: RE | Admit: 2011-08-26 | Discharge: 2011-08-26 | Disposition: A | Discharge status: Home / Self Care | Payer: Medicare Other | Source: Ambulatory Visit | Attending: Internal Medicine | Admitting: Internal Medicine

## 2011-08-26 DIAGNOSIS — Z13820 Encounter for screening for osteoporosis: Secondary | ICD-10-CM | POA: Insufficient documentation

## 2011-08-26 DIAGNOSIS — Z96649 Presence of unspecified artificial hip joint: Secondary | ICD-10-CM | POA: Insufficient documentation

## 2012-06-24 ENCOUNTER — Inpatient Hospital Stay (HOSPITAL_COMMUNITY)
Admission: EM | Admit: 2012-06-24 | Discharge: 2012-06-28 | DRG: 194 | Disposition: A | Discharge status: Skilled Nursing Facility | Payer: Medicare Other | Attending: Family Medicine | Admitting: Family Medicine

## 2012-06-24 DIAGNOSIS — J4489 Other specified chronic obstructive pulmonary disease: Secondary | ICD-10-CM | POA: Diagnosis present

## 2012-06-24 DIAGNOSIS — J189 Pneumonia, unspecified organism: Principal | ICD-10-CM | POA: Diagnosis present

## 2012-06-24 DIAGNOSIS — F0390 Unspecified dementia without behavioral disturbance: Secondary | ICD-10-CM

## 2012-06-24 DIAGNOSIS — H409 Unspecified glaucoma: Secondary | ICD-10-CM | POA: Diagnosis present

## 2012-06-24 DIAGNOSIS — G309 Alzheimer's disease, unspecified: Secondary | ICD-10-CM | POA: Diagnosis present

## 2012-06-24 DIAGNOSIS — Z8673 Personal history of transient ischemic attack (TIA), and cerebral infarction without residual deficits: Secondary | ICD-10-CM

## 2012-06-24 DIAGNOSIS — E860 Dehydration: Secondary | ICD-10-CM

## 2012-06-24 DIAGNOSIS — Z7982 Long term (current) use of aspirin: Secondary | ICD-10-CM

## 2012-06-24 DIAGNOSIS — Z993 Dependence on wheelchair: Secondary | ICD-10-CM

## 2012-06-24 DIAGNOSIS — E785 Hyperlipidemia, unspecified: Secondary | ICD-10-CM | POA: Diagnosis present

## 2012-06-24 DIAGNOSIS — I739 Peripheral vascular disease, unspecified: Secondary | ICD-10-CM | POA: Diagnosis present

## 2012-06-24 DIAGNOSIS — M810 Age-related osteoporosis without current pathological fracture: Secondary | ICD-10-CM | POA: Diagnosis present

## 2012-06-24 DIAGNOSIS — I10 Essential (primary) hypertension: Secondary | ICD-10-CM | POA: Diagnosis present

## 2012-06-24 DIAGNOSIS — I2510 Atherosclerotic heart disease of native coronary artery without angina pectoris: Secondary | ICD-10-CM | POA: Diagnosis present

## 2012-06-24 DIAGNOSIS — N179 Acute kidney failure, unspecified: Secondary | ICD-10-CM | POA: Diagnosis present

## 2012-06-24 DIAGNOSIS — N390 Urinary tract infection, site not specified: Secondary | ICD-10-CM | POA: Diagnosis present

## 2012-06-24 DIAGNOSIS — R1310 Dysphagia, unspecified: Secondary | ICD-10-CM | POA: Diagnosis present

## 2012-06-24 DIAGNOSIS — Z79899 Other long term (current) drug therapy: Secondary | ICD-10-CM

## 2012-06-24 DIAGNOSIS — J449 Chronic obstructive pulmonary disease, unspecified: Secondary | ICD-10-CM

## 2012-06-24 DIAGNOSIS — B9620 Unspecified Escherichia coli [E. coli] as the cause of diseases classified elsewhere: Secondary | ICD-10-CM

## 2012-06-24 DIAGNOSIS — D649 Anemia, unspecified: Secondary | ICD-10-CM

## 2012-06-24 DIAGNOSIS — S88119A Complete traumatic amputation at level between knee and ankle, unspecified lower leg, initial encounter: Secondary | ICD-10-CM

## 2012-06-24 DIAGNOSIS — R4182 Altered mental status, unspecified: Secondary | ICD-10-CM

## 2012-06-24 DIAGNOSIS — F0280 Dementia in other diseases classified elsewhere without behavioral disturbance: Secondary | ICD-10-CM | POA: Diagnosis present

## 2012-06-24 DIAGNOSIS — D6489 Other specified anemias: Secondary | ICD-10-CM | POA: Diagnosis present

## 2012-06-24 DIAGNOSIS — E870 Hyperosmolality and hypernatremia: Secondary | ICD-10-CM | POA: Diagnosis present

## 2012-06-24 DIAGNOSIS — Z89519 Acquired absence of unspecified leg below knee: Secondary | ICD-10-CM

## 2012-06-24 DIAGNOSIS — Z7902 Long term (current) use of antithrombotics/antiplatelets: Secondary | ICD-10-CM

## 2012-06-24 DIAGNOSIS — I701 Atherosclerosis of renal artery: Secondary | ICD-10-CM | POA: Diagnosis present

## 2012-06-24 DIAGNOSIS — R531 Weakness: Secondary | ICD-10-CM | POA: Diagnosis present

## 2012-06-24 DIAGNOSIS — G9340 Encephalopathy, unspecified: Secondary | ICD-10-CM

## 2012-06-24 HISTORY — DX: Cerebral infarction, unspecified: I63.9

## 2012-06-24 HISTORY — DX: Chronic obstructive pulmonary disease, unspecified: J44.9

## 2012-06-24 HISTORY — DX: Age-related osteoporosis without current pathological fracture: M81.0

## 2012-06-24 HISTORY — DX: Dysphagia, unspecified: R13.10

## 2012-06-24 HISTORY — DX: Adult failure to thrive: R62.7

## 2012-06-24 HISTORY — DX: Weakness: R53.1

## 2012-06-24 HISTORY — DX: Anemia, unspecified: D64.9

## 2012-06-24 HISTORY — DX: Dementia in other diseases classified elsewhere, unspecified severity, without behavioral disturbance, psychotic disturbance, mood disturbance, and anxiety: F02.80

## 2012-06-24 HISTORY — DX: Hyperlipidemia, unspecified: E78.5

## 2012-06-24 HISTORY — DX: Alzheimer's disease, unspecified: G30.9

## 2012-06-24 HISTORY — DX: Acquired absence of unspecified leg below knee: Z89.519

## 2012-06-24 HISTORY — DX: Atherosclerosis of renal artery: I70.1

## 2012-06-24 HISTORY — DX: Muscle weakness (generalized): M62.81

## 2012-06-24 HISTORY — DX: Essential (primary) hypertension: I10

## 2012-06-24 HISTORY — DX: Unspecified glaucoma: H40.9

## 2012-06-24 HISTORY — DX: Peripheral vascular disease, unspecified: I73.9

## 2012-06-24 HISTORY — DX: Unspecified dementia, unspecified severity, without behavioral disturbance, psychotic disturbance, mood disturbance, and anxiety: F03.90

## 2012-06-24 NOTE — ED Provider Notes (Addendum)
History     CSN: [id number]  Arrival date & time 06/24/12  2327   First MD Initiated Contact with Patient 06/24/12 2333      Chief Complaint  Patient presents with  . Altered Mental Status    (Consider location/radiation/quality/duration/timing/severity/associated sxs/prior treatment) HPI Comments: Mr. Nghiem presents via EMS for evaluation of altered mental status. They state that he lives in a nursing facility, and it is reported that his baseline mental status is awake, alert, able to ambulate with minimal assistance, and interactive with staff. He does have a history of dementia and dysphagia. Per the EMS report he has been less active than at his baseline. He states that he has not been talkative or out of bed today. When they administer his medications by mouth, he had an episode of choking. They had to remove the tablets from his mouth. There has been no noted fever, nausea, vomiting, diarrhea , or evidence of pain or distress. EMS does note however that he has rhonchorous breath sounds. He has not spoken to EMS on route to the emergency department.  Patient is a 78 y.o. male presenting with altered mental status. The history is provided by the EMS personnel. The history is limited by the condition of the patient (pt is altered, only significant response is to his own name).  Altered Mental Status This is a new problem. The current episode started 6 to 12 hours ago. The problem occurs constantly. The problem has been gradually worsening. Nothing aggravates the symptoms. Nothing relieves the symptoms. He has tried nothing for the symptoms.    No past medical history on file.  No past surgical history on file.  No family history on file.  History  Substance Use Topics  . Smoking status: Not on file  . Smokeless tobacco: Not on file  . Alcohol Use: Not on file      Review of Systems  Unable to perform ROS Psychiatric/Behavioral: Positive for altered mental status.     Allergies  Review of patient's allergies indicates not on file.  Home Medications  No current outpatient prescriptions on file.  There were no vitals taken for this visit.  Physical Exam  Nursing note and vitals reviewed. Constitutional: He appears lethargic. No distress. He is not intubated.       Pt appears weak and chronically ill.  HENT:  Head: Normocephalic and atraumatic.  Right Ear: External ear normal.  Left Ear: External ear normal.  Nose: Nose normal.  Mouth/Throat: Oropharynx is clear and moist. No oropharyngeal exudate.  Eyes: EOM are normal. Pupils are equal, round, and reactive to light. Right eye exhibits no discharge. Left eye exhibits no discharge. No scleral icterus.  Neck: Trachea normal and normal range of motion. Neck supple. Normal carotid pulses and no JVD present. Carotid bruit is not present. No tracheal deviation present. No mass and no thyromegaly present.  Cardiovascular: Regular rhythm, intact distal pulses and normal pulses.   Occasional extrasystoles are present. PMI is not displaced.  Exam reveals distant heart sounds. Exam reveals no gallop, no S3, no S4 and no decreased pulses.   Pulmonary/Chest: Effort normal. No accessory muscle usage or stridor. No apnea, not tachypneic and not bradypneic. He is not intubated. No respiratory distress. He has decreased breath sounds in the right lower field and the left lower field. He has no wheezes. He has rhonchi in the right upper field, the right middle field, the right lower field, the left upper field, the left middle  field and the left lower field. He has rales. He exhibits no tenderness.  Abdominal: Soft. Bowel sounds are normal. He exhibits no distension and no mass. There is no tenderness. There is no rebound and no guarding.  Musculoskeletal: Normal range of motion. He exhibits no edema and no tenderness.  Lymphadenopathy:    He has no cervical adenopathy.  Neurological: He appears lethargic. He displays  tremor. He displays no atrophy. No sensory deficit. He displays no seizure activity. GCS eye subscore is 4. GCS verbal subscore is 3. GCS motor subscore is 5.       Exam limited as pt is not following commands  Skin: Skin is warm, dry and intact. No abrasion, no bruising, no ecchymosis, no laceration and no rash noted. He is not diaphoretic. No pallor.  Psychiatric: Cognition and memory are impaired. He exhibits a depressed mood. He is noncommunicative.    ED Course  Procedures (including critical care time)   Labs Reviewed  CBC  COMPREHENSIVE METABOLIC PANEL  URINALYSIS, ROUTINE W REFLEX MICROSCOPIC  URINE CULTURE  LACTIC ACID, PLASMA  TROPONIN I   No results found.   No diagnosis found.   Date: 07/25/2012  Rate: 79 bpm  Rhythm: sinus  QRS Axis: normal  Intervals: normal  ST/T Wave abnormalities: nonspecific ST changes  Conduction Disutrbances:+ LVH  Narrative Interpretation:   Old EKG Reviewed:        MDM  Pt presents via EMS for evaluation of altered mental status.  He is currently mostly nonverbal with the occasional attempted phrase (that is unrecognizable).  Note marked bilat ronchi.  Plan initiate a work-up for altered mental status that includes serial VS, rectal temp, basic labs, U/A, CXR, CT head, and EKG.   1308.  Pt stable, NAD.  CXR consistent with lower lobe infiltrates.  Cultures and abx ordered.  0300.  Pt remains stable, NAD.  Noted also hypernatremia and renal failure.  IVF initiated.  Discussed with Dr. Lovell Sheehan (hospitalist).  Broadened abx coverage from ceftriaxone and azithromycin to include levaquin for health care acquired PNA.  Plan admit for further mgmnt of altered mental status, PNA, dehydration, acute renal failure.        Tobin Chad, MD 06/25/12 6578  Tobin Chad, MD 07/25/12 4696  Tobin Chad, MD 08/02/12 [phone]

## 2012-06-24 NOTE — ED Notes (Signed)
Per EMS nursing home reported decrease LOC, lethargic and unresponsive to any command. When feeding him dinner pt started choking, EMS started some Rhonchi. EMS stated while pulling into [facility] pt became responsive and talking to them, awake now, alert. PIV 20ga LFA.

## 2012-06-25 ENCOUNTER — Encounter (HOSPITAL_COMMUNITY): Payer: Self-pay | Admitting: Emergency Medicine

## 2012-06-25 ENCOUNTER — Emergency Department (HOSPITAL_COMMUNITY): Payer: Medicare Other

## 2012-06-25 DIAGNOSIS — E785 Hyperlipidemia, unspecified: Secondary | ICD-10-CM | POA: Diagnosis present

## 2012-06-25 DIAGNOSIS — I2510 Atherosclerotic heart disease of native coronary artery without angina pectoris: Secondary | ICD-10-CM | POA: Diagnosis present

## 2012-06-25 DIAGNOSIS — D649 Anemia, unspecified: Secondary | ICD-10-CM | POA: Diagnosis present

## 2012-06-25 DIAGNOSIS — I701 Atherosclerosis of renal artery: Secondary | ICD-10-CM | POA: Insufficient documentation

## 2012-06-25 DIAGNOSIS — E870 Hyperosmolality and hypernatremia: Secondary | ICD-10-CM | POA: Diagnosis present

## 2012-06-25 DIAGNOSIS — F0390 Unspecified dementia without behavioral disturbance: Secondary | ICD-10-CM | POA: Diagnosis present

## 2012-06-25 DIAGNOSIS — G9340 Encephalopathy, unspecified: Secondary | ICD-10-CM | POA: Insufficient documentation

## 2012-06-25 DIAGNOSIS — H409 Unspecified glaucoma: Secondary | ICD-10-CM | POA: Diagnosis present

## 2012-06-25 DIAGNOSIS — R1310 Dysphagia, unspecified: Secondary | ICD-10-CM | POA: Diagnosis present

## 2012-06-25 DIAGNOSIS — R531 Weakness: Secondary | ICD-10-CM | POA: Diagnosis present

## 2012-06-25 DIAGNOSIS — J449 Chronic obstructive pulmonary disease, unspecified: Secondary | ICD-10-CM | POA: Insufficient documentation

## 2012-06-25 DIAGNOSIS — N390 Urinary tract infection, site not specified: Secondary | ICD-10-CM | POA: Insufficient documentation

## 2012-06-25 DIAGNOSIS — I10 Essential (primary) hypertension: Secondary | ICD-10-CM | POA: Diagnosis present

## 2012-06-25 DIAGNOSIS — N179 Acute kidney failure, unspecified: Secondary | ICD-10-CM

## 2012-06-25 DIAGNOSIS — J189 Pneumonia, unspecified organism: Secondary | ICD-10-CM

## 2012-06-25 DIAGNOSIS — E860 Dehydration: Secondary | ICD-10-CM | POA: Diagnosis present

## 2012-06-25 DIAGNOSIS — I739 Peripheral vascular disease, unspecified: Secondary | ICD-10-CM | POA: Diagnosis present

## 2012-06-25 DIAGNOSIS — R4182 Altered mental status, unspecified: Secondary | ICD-10-CM

## 2012-06-25 LAB — URINALYSIS, ROUTINE W REFLEX MICROSCOPIC
Hgb urine dipstick: NEGATIVE
Specific Gravity, Urine: 1.021 (ref 1.005–1.030)
Urobilinogen, UA: 0.2 mg/dL (ref 0.0–1.0)
pH: 5.5 (ref 5.0–8.0)

## 2012-06-25 LAB — CBC
MCH: 26.9 pg (ref 26.0–34.0)
MCHC: 30.9 g/dL (ref 30.0–36.0)
MCV: 86.8 fL (ref 78.0–100.0)
MCV: 86.9 fL (ref 78.0–100.0)
Platelets: 186 10*3/uL (ref 150–400)
Platelets: 204 10*3/uL (ref 150–400)
RDW: 15.7 % — ABNORMAL HIGH (ref 11.5–15.5)
RDW: 15.7 % — ABNORMAL HIGH (ref 11.5–15.5)
WBC: 13.8 10*3/uL — ABNORMAL HIGH (ref 4.0–10.5)

## 2012-06-25 LAB — COMPREHENSIVE METABOLIC PANEL
ALT: 12 U/L (ref 0–53)
AST: 25 U/L (ref 0–37)
Albumin: 3.1 g/dL — ABNORMAL LOW (ref 3.5–5.2)
CO2: 27 mEq/L (ref 19–32)
Calcium: 11.3 mg/dL — ABNORMAL HIGH (ref 8.4–10.5)
Creatinine, Ser: 2.57 mg/dL — ABNORMAL HIGH (ref 0.50–1.35)
GFR calc non Af Amer: 21 mL/min — ABNORMAL LOW (ref >90)
Sodium: 151 mEq/L — ABNORMAL HIGH (ref 135–145)
Total Protein: 7 g/dL (ref 6.0–8.3)

## 2012-06-25 LAB — URINE MICROSCOPIC-ADD ON

## 2012-06-25 LAB — PRO B NATRIURETIC PEPTIDE: Pro B Natriuretic peptide (BNP): 67.9 pg/mL (ref 0–450)

## 2012-06-25 LAB — BASIC METABOLIC PANEL
CO2: 28 mEq/L (ref 19–32)
Calcium: 10.5 mg/dL (ref 8.4–10.5)
Creatinine, Ser: 2.23 mg/dL — ABNORMAL HIGH (ref 0.50–1.35)
GFR calc non Af Amer: 25 mL/min — ABNORMAL LOW (ref >90)

## 2012-06-25 LAB — TROPONIN I: Troponin I: <0.3 ng/mL (ref <0.30)

## 2012-06-25 MED ORDER — ONDANSETRON HCL 4 MG/2ML IJ SOLN: 4.0000 mg | Freq: Four times a day (QID) | INTRAMUSCULAR | Status: DC | PRN

## 2012-06-25 MED ORDER — VANCOMYCIN HCL IN DEXTROSE 1-5 GM/200ML-% IV SOLN
1000.0000 mg | INTRAVENOUS | Status: DC
  Administered 2012-06-25 – 2012-06-27 (×2): 1000 mg via INTRAVENOUS
  Filled 2012-06-25 (×2): qty 200

## 2012-06-25 MED ORDER — SODIUM CHLORIDE 0.9 % IV SOLN
INTRAVENOUS | Status: DC
  Administered 2012-06-25: 125 mL/h via INTRAVENOUS

## 2012-06-25 MED ORDER — BIOTENE DRY MOUTH MT LIQD
15.0000 mL | Freq: Two times a day (BID) | OROMUCOSAL | Status: DC
  Administered 2012-06-25 – 2012-06-28 (×5): 15 mL via OROMUCOSAL

## 2012-06-25 MED ORDER — ASPIRIN 81 MG PO CHEW
81.0000 mg | CHEWABLE_TABLET | Freq: Every day | ORAL | Status: DC
  Administered 2012-06-25 – 2012-06-28 (×4): 81 mg via ORAL
  Filled 2012-06-25 (×4): qty 1

## 2012-06-25 MED ORDER — DEXTROSE 5 % IV SOLN
1.0000 g | Freq: Once | INTRAVENOUS | Status: AC
  Administered 2012-06-25: 1 g via INTRAVENOUS
  Filled 2012-06-25: qty 10

## 2012-06-25 MED ORDER — GABAPENTIN 100 MG PO CAPS
100.0000 mg | ORAL_CAPSULE | Freq: Two times a day (BID) | ORAL | Status: DC
  Administered 2012-06-25 – 2012-06-28 (×7): 100 mg via ORAL
  Filled 2012-06-25 (×8): qty 1

## 2012-06-25 MED ORDER — DEXTROSE 5 % IV SOLN
1.0000 g | INTRAVENOUS | Status: DC
  Administered 2012-06-25 – 2012-06-27 (×3): 1 g via INTRAVENOUS
  Filled 2012-06-25 (×4): qty 1

## 2012-06-25 MED ORDER — HYDROMORPHONE HCL PF 1 MG/ML IJ SOLN: 0.5000 mg | INTRAMUSCULAR | Status: DC | PRN

## 2012-06-25 MED ORDER — POLYETHYLENE GLYCOL 3350 17 G PO PACK
17.0000 g | PACK | Freq: Every day | ORAL | Status: DC
  Administered 2012-06-25 – 2012-06-28 (×3): 17 g via ORAL
  Filled 2012-06-25 (×4): qty 1

## 2012-06-25 MED ORDER — MEMANTINE HCL 10 MG PO TABS
10.0000 mg | ORAL_TABLET | Freq: Two times a day (BID) | ORAL | Status: DC
  Administered 2012-06-25 – 2012-06-28 (×7): 10 mg via ORAL
  Filled 2012-06-25 (×8): qty 1

## 2012-06-25 MED ORDER — OMEGA-3-ACID ETHYL ESTERS 1 G PO CAPS
2.0000 g | ORAL_CAPSULE | Freq: Two times a day (BID) | ORAL | Status: DC
  Administered 2012-06-25 – 2012-06-28 (×6): 2 g via ORAL
  Filled 2012-06-25 (×8): qty 2

## 2012-06-25 MED ORDER — ACETAMINOPHEN 325 MG PO TABS: 650.0000 mg | ORAL_TABLET | Freq: Four times a day (QID) | ORAL | Status: DC | PRN

## 2012-06-25 MED ORDER — HYDROCODONE-ACETAMINOPHEN 5-325 MG PO TABS: 1.0000 | ORAL_TABLET | ORAL | Status: DC | PRN

## 2012-06-25 MED ORDER — SODIUM CHLORIDE 0.45 % IV SOLN
INTRAVENOUS | Status: DC
  Administered 2012-06-25: 05:00:00 via INTRAVENOUS

## 2012-06-25 MED ORDER — DESMOPRESSIN ACE SPRAY REFRIG 0.01 % NA SOLN
1.0000 | Freq: Three times a day (TID) | NASAL | Status: DC
  Administered 2012-06-25 (×2): 1 via NASAL
  Filled 2012-06-25: qty 5

## 2012-06-25 MED ORDER — BRIMONIDINE TARTRATE 0.1 % OP SOLN: 1.0000 [drp] | Freq: Two times a day (BID) | OPHTHALMIC | Status: DC

## 2012-06-25 MED ORDER — ACETAMINOPHEN 650 MG RE SUPP: 650.0000 mg | Freq: Four times a day (QID) | RECTAL | Status: DC | PRN

## 2012-06-25 MED ORDER — DONEPEZIL HCL 10 MG PO TABS
10.0000 mg | ORAL_TABLET | Freq: Every day | ORAL | Status: DC
  Administered 2012-06-25 – 2012-06-27 (×3): 10 mg via ORAL
  Filled 2012-06-25 (×4): qty 1

## 2012-06-25 MED ORDER — ALBUTEROL SULFATE (5 MG/ML) 0.5% IN NEBU
2.5000 mg | INHALATION_SOLUTION | Freq: Four times a day (QID) | RESPIRATORY_TRACT | Status: DC
  Administered 2012-06-25: 2.5 mg via RESPIRATORY_TRACT
  Filled 2012-06-25 (×2): qty 0.5

## 2012-06-25 MED ORDER — BRIMONIDINE TARTRATE 0.15 % OP SOLN
1.0000 [drp] | Freq: Two times a day (BID) | OPHTHALMIC | Status: DC
  Filled 2012-06-25: qty 5

## 2012-06-25 MED ORDER — AMLODIPINE BESYLATE 10 MG PO TABS
10.0000 mg | ORAL_TABLET | Freq: Every day | ORAL | Status: DC
  Administered 2012-06-25 – 2012-06-28 (×4): 10 mg via ORAL
  Filled 2012-06-25 (×4): qty 1

## 2012-06-25 MED ORDER — DEXTROSE 5 % IV SOLN
1.0000 g | Freq: Two times a day (BID) | INTRAVENOUS | Status: DC
  Filled 2012-06-25: qty 1

## 2012-06-25 MED ORDER — ENOXAPARIN SODIUM 30 MG/0.3ML ~~LOC~~ SOLN
30.0000 mg | SUBCUTANEOUS | Status: DC
  Administered 2012-06-25 – 2012-06-28 (×4): 30 mg via SUBCUTANEOUS
  Filled 2012-06-25 (×4): qty 0.3

## 2012-06-25 MED ORDER — CLOPIDOGREL BISULFATE 75 MG PO TABS
75.0000 mg | ORAL_TABLET | Freq: Every day | ORAL | Status: DC
  Administered 2012-06-25 – 2012-06-28 (×4): 75 mg via ORAL
  Filled 2012-06-25 (×5): qty 1

## 2012-06-25 MED ORDER — FENOFIBRATE 54 MG PO TABS
54.0000 mg | ORAL_TABLET | Freq: Every day | ORAL | Status: DC
  Administered 2012-06-25 – 2012-06-28 (×4): 54 mg via ORAL
  Filled 2012-06-25 (×4): qty 1

## 2012-06-25 MED ORDER — SIMVASTATIN 20 MG PO TABS
20.0000 mg | ORAL_TABLET | Freq: Every evening | ORAL | Status: DC
  Administered 2012-06-25 – 2012-06-27 (×3): 20 mg via ORAL
  Filled 2012-06-25 (×4): qty 1

## 2012-06-25 MED ORDER — ADULT MULTIVITAMIN W/MINERALS CH
1.0000 | ORAL_TABLET | Freq: Every day | ORAL | Status: DC
  Administered 2012-06-25 – 2012-06-28 (×4): 1 via ORAL
  Filled 2012-06-25 (×4): qty 1

## 2012-06-25 MED ORDER — ONDANSETRON HCL 4 MG PO TABS: 4.0000 mg | ORAL_TABLET | Freq: Four times a day (QID) | ORAL | Status: DC | PRN

## 2012-06-25 MED ORDER — ALBUTEROL SULFATE (5 MG/ML) 0.5% IN NEBU: 2.5000 mg | INHALATION_SOLUTION | RESPIRATORY_TRACT | Status: DC | PRN

## 2012-06-25 MED ORDER — CHLORHEXIDINE GLUCONATE 0.12 % MT SOLN
15.0000 mL | Freq: Two times a day (BID) | OROMUCOSAL | Status: DC
  Administered 2012-06-25 – 2012-06-28 (×7): 15 mL via OROMUCOSAL
  Filled 2012-06-25 (×9): qty 15

## 2012-06-25 MED ORDER — NITROGLYCERIN 0.2 MG/HR TD PT24
0.2000 mg | MEDICATED_PATCH | Freq: Every day | TRANSDERMAL | Status: DC
  Administered 2012-06-25 – 2012-06-28 (×4): 0.2 mg via TRANSDERMAL
  Filled 2012-06-25 (×4): qty 1

## 2012-06-25 MED ORDER — TRAVOPROST (BAK FREE) 0.004 % OP SOLN
1.0000 [drp] | Freq: Every day | OPHTHALMIC | Status: DC
  Administered 2012-06-25 – 2012-06-27 (×3): 1 [drp] via OPHTHALMIC
  Filled 2012-06-25: qty 2.5

## 2012-06-25 MED ORDER — DEXTROSE 5 % IV SOLN
500.0000 mg | Freq: Once | INTRAVENOUS | Status: AC
  Administered 2012-06-25: 500 mg via INTRAVENOUS
  Filled 2012-06-25: qty 500

## 2012-06-25 MED ORDER — DORZOLAMIDE HCL 2 % OP SOLN
1.0000 [drp] | Freq: Two times a day (BID) | OPHTHALMIC | Status: DC
  Administered 2012-06-25 – 2012-06-28 (×7): 1 [drp] via OPHTHALMIC
  Filled 2012-06-25: qty 10

## 2012-06-25 MED ORDER — LEVOFLOXACIN IN D5W 500 MG/100ML IV SOLN
500.0000 mg | INTRAVENOUS | Status: DC
  Administered 2012-06-27: 500 mg via INTRAVENOUS
  Filled 2012-06-25: qty 100

## 2012-06-25 MED ORDER — BRIMONIDINE TARTRATE 0.2 % OP SOLN
1.0000 [drp] | Freq: Two times a day (BID) | OPHTHALMIC | Status: DC
  Administered 2012-06-25 – 2012-06-28 (×7): 1 [drp] via OPHTHALMIC
  Filled 2012-06-25: qty 5

## 2012-06-25 MED ORDER — FAMOTIDINE 20 MG PO TABS
20.0000 mg | ORAL_TABLET | Freq: Every day | ORAL | Status: DC
  Administered 2012-06-25 – 2012-06-28 (×4): 20 mg via ORAL
  Filled 2012-06-25 (×4): qty 1

## 2012-06-25 MED ORDER — LEVOFLOXACIN IN D5W 750 MG/150ML IV SOLN
750.0000 mg | Freq: Once | INTRAVENOUS | Status: AC
  Administered 2012-06-25: 750 mg via INTRAVENOUS
  Filled 2012-06-25: qty 150

## 2012-06-25 MED ORDER — LEVOFLOXACIN IN D5W 500 MG/100ML IV SOLN: 500.0000 mg | INTRAVENOUS | Status: DC

## 2012-06-25 MED ORDER — ALBUTEROL SULFATE (5 MG/ML) 0.5% IN NEBU
2.5000 mg | INHALATION_SOLUTION | Freq: Three times a day (TID) | RESPIRATORY_TRACT | Status: DC
  Administered 2012-06-26 – 2012-06-28 (×8): 2.5 mg via RESPIRATORY_TRACT
  Filled 2012-06-25 (×8): qty 0.5

## 2012-06-25 MED ORDER — HYDRALAZINE HCL 20 MG/ML IJ SOLN
10.0000 mg | Freq: Four times a day (QID) | INTRAMUSCULAR | Status: DC | PRN
  Administered 2012-06-25 – 2012-06-28 (×2): 10 mg via INTRAVENOUS
  Filled 2012-06-25 (×2): qty 0.5

## 2012-06-25 MED ORDER — HYPROMELLOSE (GONIOSCOPIC) 2.5 % OP SOLN
1.0000 [drp] | Freq: Three times a day (TID) | OPHTHALMIC | Status: DC
  Administered 2012-06-25 (×3): 1 [drp] via OPHTHALMIC
  Filled 2012-06-25: qty 15

## 2012-06-25 MED ORDER — SODIUM CHLORIDE 0.9 % IV BOLUS (SEPSIS)
1000.0000 mL | INTRAVENOUS | Status: AC
  Administered 2012-06-25: 1000 mL via INTRAVENOUS

## 2012-06-25 NOTE — H&P (Signed)
[facility] History and Physical  Joseph Ponce ZOX:[id number] DOB: 12/04/1922 DOA: 06/24/2012   Referring physician: EDP PCP: [name], MD   Chief Complaint: Altered Mental Status  HPI:  Joseph Ponce is an 78 y.o. Male from the [facility] who was increasingly lethargic over the day.  He was noted by staff to have a choking spells when he was given his medications.  He was described as not being himself which is interactive and verbal, however he has dementia and is confused at baseline.  There was no report of fevers or chills or nausea or vomiting.     Review of Systems:  Unable to Obtain from Patient   Past Medical History  Diagnosis Date  . Dementia   . Muscle weakness   . Osteoporosis   . Alzheimer disease   . Failure to thrive in adult   . Dementia   . CVA (cerebral vascular accident)     speech and language d/o deficits  . Weakness   . Hyperlipemia   . Dysphagia   . Anemia   . PVD (peripheral vascular disease)   . COPD (chronic obstructive pulmonary disease)   . Glaucoma (increased eye pressure)    Past Surgical History  Procedure Date  . Leg amputation below knee     left    Prior to Admission medications   Medication Sig Start Date End Date Taking? Authorizing Provider  amLODipine (NORVASC) 10 MG tablet Take 10 mg by mouth daily.   Yes Historical Provider, MD  aspirin 81 MG chewable tablet Chew 81 mg by mouth daily.   Yes Historical Provider, MD  brimonidine (ALPHAGAN P) 0.1 % SOLN Place 1 drop into both eyes 2 (two) times daily.   Yes Historical Provider, MD  calcium-vitamin D (OSCAL WITH D) 500-200 MG-UNIT per tablet Take 1 tablet by mouth 2 (two) times daily.   Yes Historical Provider, MD  clopidogrel (PLAVIX) 75 MG tablet Take 75 mg by mouth daily.   Yes Historical Provider, MD  denosumab (PROLIA) 60 MG/ML SOLN injection Inject 60 mg into the skin every 6 (six) months. Next dose due 08/2012   Yes Historical Provider, MD  desmopressin  (DDAVP) 0.01 % SOLN Place 1 spray into the nose 3 (three) times daily.   Yes Historical Provider, MD  donepezil (ARICEPT) 10 MG tablet Take 10 mg by mouth at bedtime as needed.   Yes Historical Provider, MD  dorzolamide (TRUSOPT) 2 % ophthalmic solution Place 1 drop into both eyes 2 (two) times daily.   Yes Historical Provider, MD  famotidine (PEPCID) 20 MG tablet Take 20 mg by mouth daily.   Yes Historical Provider, MD  fenofibrate (TRICOR) 48 MG tablet Take 48 mg by mouth daily.   Yes Historical Provider, MD  gabapentin (NEURONTIN) 100 MG capsule Take 100 mg by mouth 2 (two) times daily.   Yes Historical Provider, MD  HYDROcodone-acetaminophen (VICODIN) 5-500 MG per tablet Take 1 tablet by mouth 4 (four) times daily.   Yes Historical Provider, MD  hydroxypropyl methylcellulose (ISOPTO TEARS) 2.5 % ophthalmic solution Place 1 drop into both eyes 3 (three) times daily.   Yes Historical Provider, MD  magnesium hydroxide (MILK OF MAGNESIA) 400 MG/5ML suspension Take 30 mLs by mouth daily as needed. For constipation   Yes Historical Provider, MD  memantine (NAMENDA) 10 MG tablet Take 10 mg by mouth 2 (two) times daily.   Yes Historical Provider, MD  Multiple Vitamin (MULTIVITAMIN WITH MINERALS) TABS Take 1 tablet by  mouth daily.   Yes Historical Provider, MD  nitroGLYCERIN (NITRODUR - DOSED IN MG/24 HR) 0.2 mg/hr Place 1 patch onto the skin daily.   Yes Historical Provider, MD  omega-3 acid ethyl esters (LOVAZA) 1 G capsule Take 2 g by mouth 2 (two) times daily.   Yes Historical Provider, MD  permethrin (ELIMITE) 5 % cream Apply 1 application topically once. Apply to body on 06/10/12, then repeat 06/17/12   Yes Historical Provider, MD  polyethylene glycol (MIRALAX / GLYCOLAX) packet Take 17 g by mouth daily.   Yes Historical Provider, MD  simvastatin (ZOCOR) 20 MG tablet Take 20 mg by mouth every evening.   Yes Historical Provider, MD  Travoprost, BAK Free, (TRAVATAN) 0.004 % SOLN ophthalmic solution Place  1 drop into both eyes at bedtime.   Yes Historical Provider, MD    Allergies:  No Known Allergies  Social History:   From [place] ([facility]) and is Wheelchair Bound, NonSmoker, No Alcohol Usage, No Illicit Drug Usage  Family History:  Unable to Obtain from the Patient   Physical Exam:  GEN: Pleasantly confused 78 year old Elderly African American male examined  and in no acute distress; cooperative with exam Filed Vitals:   06/25/12 0102 06/25/12 0329  BP: 126/65 155/78  Pulse: 74 80  Temp: 97.9 F (36.6 C) 97.6 F (36.4 C)  TempSrc: Axillary Axillary  Resp:  12  SpO2: 94% 99%   Blood pressure 155/78, pulse 80, temperature 97.6 F (36.4 C), temperature source Axillary, resp. rate 12, SpO2 99.00%. PSYCH: He is alert and oriented x 1; does not appear anxious does not appear depressed; affect is normal HEENT: Normocephalic and Atraumatic, Mucous membranes pink; PERRLA; EOM intact; Fundi:  Benign;  No scleral icterus, Nares: Patent, Oropharynx: Clear, Edentulous, Neck:  FROM, no cervical lymphadenopathy nor thyromegaly or carotid bruit; no JVD; Breasts:: Not examined CHEST WALL: No tenderness CHEST: Normal respiration, clear to auscultation bilaterally HEART: Regular rate and rhythm; no murmurs rubs or gallops BACK: No kyphosis or scoliosis; no CVA tenderness ABDOMEN: Positive Bowel Sounds, soft non-tender; no masses, no organomegaly.   Rectal Exam: Not done EXTREMITIES: LLE BKA;      RLE: no cyanosis, clubbing or edema; no ulcerations. Genitalia: not examined PULSES: 2+ and symmetric SKIN: Normal hydration no rash or ulceration CNS: Cranial nerves 2-12 grossly intact Generalized Weakness, 4/5 in Upper Extermities  And 3/5 in Lower Extremities.    Labs on Admission:  Basic Metabolic Panel:  Lab 06/25/12 1610  NA 151*  K 4.0  CL 114*  CO2 27  GLUCOSE 135*  BUN 47*  CREATININE 2.57*  CALCIUM 11.3*  MG --  PHOS --   Liver Function Tests:  Lab  06/25/12 0045  AST 25  ALT 12  ALKPHOS 38*  BILITOT 0.2*  PROT 7.0  ALBUMIN 3.1*   No results found for this basename: LIPASE:5,AMYLASE:5 in the last 168 hours No results found for this basename: AMMONIA:5 in the last 168 hours CBC:  Lab 06/25/12 0045  WBC 15.2*  NEUTROABS --  HGB 11.1*  HCT 35.9*  MCV 86.9  PLT 204   Cardiac Enzymes:  Lab 06/25/12 0045  CKTOTAL --  CKMB --  CKMBINDEX --  TROPONINI <0.30    BNP (last 3 results)  Basename 06/25/12 0025  PROBNP 67.9   CBG: No results found for this basename: GLUCAP:5 in the last 168 hours  Radiological Exams on Admission: Dg Chest 2 View  06/25/2012  *RADIOLOGY REPORT*  Clinical Data: Cough.  Altered mental status.  CHEST - 2 VIEW  Comparison: 04/11/2010  Findings: Shallow inspiration.  Mild cardiac enlargement with normal pulmonary vascularity.  Since the previous study, there is developing focal opacity in the lung bases, most prominently on the left, suggesting developing atelectasis or infiltration.  No blunting of costophrenic angles.  No pneumothorax.  Mediastinal contours appear intact.  Old left rib fractures.  Degenerative changes in the spine and shoulders.  IMPRESSION: Developing infiltration or atelectasis in the lung bases.  Original Report Authenticated By: Marlon Pel, M.D.   Ct Head Wo Contrast  06/25/2012  *RADIOLOGY REPORT*  Clinical Data: Altered mental status.  CT HEAD WITHOUT CONTRAST  Technique:  Contiguous axial images were obtained from the base of the skull through the vertex without contrast.  Comparison: 07/12/2009  Findings: There is atrophy and chronic small vessel disease changes. No acute intracranial abnormality.  Specifically, no hemorrhage, hydrocephalus, mass lesion, acute infarction, or significant intracranial injury.  No acute calvarial abnormality. Visualized paranasal sinuses and mastoids clear.  Orbital soft tissues unremarkable.  IMPRESSION: No acute intracranial abnormality.   Atrophy, chronic microvascular disease.  Original Report Authenticated By: Cyndie Chime, M.D.    EKG: Independently reviewed.   Assessment: Principal Problem:  *HCAP (healthcare-associated pneumonia) Active Problems:  Dehydration  Renal failure, acute  Altered mental status  Dementia  Anemia  Hypernatremia UTI  Plan:    Admit To Med/surg Bed IV Antibiotics for HCAP and UTI, with IV Vancomycin, Cefepime, and Levaquin  Nebs, O2 PRN NPO, Speech swallowing evaluation IVFs for rehydration Monitor electrolytes and BUN/Cr Reconcile Regular Medications DVT prophylaxis    Code Status: FULL CODE Family Communication:  Disposition Plan: RETURN TO SNF  Time spent: 60 minutes   Ron Parker [facility] Pager [phone]  If 7PM-7AM, please contact night-coverage [email or web address] Password [email or web address] 06/25/2012, 4:21 AM

## 2012-06-25 NOTE — Progress Notes (Signed)
Pt was seen and examined.  Chart was reviewed, H&P and orders reviewed.  Home meds reconciled.  Additional orders placed.  Maryln Manuel, MD

## 2012-06-25 NOTE — Progress Notes (Signed)
ANTIBIOTIC CONSULT NOTE - INITIAL  Pharmacy Consult for vancomycin Indication: rule out pneumonia  No Known Allergies  Patient Measurements: Height: 5\' 8"  (172.7 cm) Weight: 167 lb 5.3 oz (75.9 kg) IBW/kg (Calculated) : 68.4   Vital Signs: Temp: 97.6 F (36.4 C) (08/18 0432) Temp src: Axillary (08/18 0432) BP: 103/58 mmHg (08/18 0432) Pulse Rate: 48  (08/18 0432) Intake/Output from previous day:   Intake/Output from this shift:    Labs:  Basename 06/25/12 0045  WBC 15.2*  HGB 11.1*  PLT 204  LABCREA --  CREATININE 2.57*   Estimated Creatinine Clearance: 19.6 ml/min (by C-G formula based on Cr of 2.57). No results found for this basename: VANCOTROUGH:2,VANCOPEAK:2,VANCORANDOM:2,GENTTROUGH:2,GENTPEAK:2,GENTRANDOM:2,TOBRATROUGH:2,TOBRAPEAK:2,TOBRARND:2,AMIKACINPEAK:2,AMIKACINTROU:2,AMIKACIN:2, in the last 72 hours   Microbiology: No results found for this or any previous visit (from the past 720 hour(s)).  Medical History: Past Medical History  Diagnosis Date  . Dementia   . Muscle weakness   . Osteoporosis   . Alzheimer disease   . Failure to thrive in adult   . Dementia   . CVA (cerebral vascular accident)     speech and language d/o deficits  . Weakness   . Hyperlipemia   . Dysphagia   . Anemia   . PVD (peripheral vascular disease)   . COPD (chronic obstructive pulmonary disease)   . Glaucoma (increased eye pressure)     Medications:  Prescriptions prior to admission  Medication Sig Dispense Refill  . amLODipine (NORVASC) 10 MG tablet Take 10 mg by mouth daily.      Marland Kitchen aspirin 81 MG chewable tablet Chew 81 mg by mouth daily.      . brimonidine (ALPHAGAN P) 0.1 % SOLN Place 1 drop into both eyes 2 (two) times daily.      . calcium-vitamin D (OSCAL WITH D) 500-200 MG-UNIT per tablet Take 1 tablet by mouth 2 (two) times daily.      . clopidogrel (PLAVIX) 75 MG tablet Take 75 mg by mouth daily.      Marland Kitchen denosumab (PROLIA) 60 MG/ML SOLN injection Inject 60 mg  into the skin every 6 (six) months. Next dose due 08/2012      . desmopressin (DDAVP) 0.01 % SOLN Place 1 spray into the nose 3 (three) times daily.      Marland Kitchen donepezil (ARICEPT) 10 MG tablet Take 10 mg by mouth at bedtime as needed.      . dorzolamide (TRUSOPT) 2 % ophthalmic solution Place 1 drop into both eyes 2 (two) times daily.      . famotidine (PEPCID) 20 MG tablet Take 20 mg by mouth daily.      . fenofibrate (TRICOR) 48 MG tablet Take 48 mg by mouth daily.      Marland Kitchen gabapentin (NEURONTIN) 100 MG capsule Take 100 mg by mouth 2 (two) times daily.      Marland Kitchen HYDROcodone-acetaminophen (VICODIN) 5-500 MG per tablet Take 1 tablet by mouth 4 (four) times daily.      . hydroxypropyl methylcellulose (ISOPTO TEARS) 2.5 % ophthalmic solution Place 1 drop into both eyes 3 (three) times daily.      . magnesium hydroxide (MILK OF MAGNESIA) 400 MG/5ML suspension Take 30 mLs by mouth daily as needed. For constipation      . memantine (NAMENDA) 10 MG tablet Take 10 mg by mouth 2 (two) times daily.      . Multiple Vitamin (MULTIVITAMIN WITH MINERALS) TABS Take 1 tablet by mouth daily.      . nitroGLYCERIN (NITRODUR -  DOSED IN MG/24 HR) 0.2 mg/hr Place 1 patch onto the skin daily.      Marland Kitchen omega-3 acid ethyl esters (LOVAZA) 1 G capsule Take 2 g by mouth 2 (two) times daily.      . permethrin (ELIMITE) 5 % cream Apply 1 application topically once. Apply to body on 06/10/12, then repeat 06/17/12      . polyethylene glycol (MIRALAX / GLYCOLAX) packet Take 17 g by mouth daily.      . simvastatin (ZOCOR) 20 MG tablet Take 20 mg by mouth every evening.      . Travoprost, BAK Free, (TRAVATAN) 0.004 % SOLN ophthalmic solution Place 1 drop into both eyes at bedtime.       Assessment: 53 yom from SNF presented to the ED with AMS. He will be started on empiric vancomycin + levaquin + cefepime for possibly HCAP and UTI. Pt is afebrile but WBC is elevated at 15.2. Cultures are pending. Noted that patients Scr is elevated at 2.57.    Goal of Therapy:  Vancomycin trough level 15-20 mcg/ml  Plan:  1. Vancomycin 1gm IV Q48H 2. F/u renal function, C&S and trough at [facility], Drake Leach 06/25/2012,4:57 AM

## 2012-06-25 NOTE — Progress Notes (Signed)
Pt arrived to the floor via stretcher, accompanied by Nursing staff. Admission hx and assessment completed. Pt had no complaints of pain or shortness of breath. Will continue to assess. Bed in lowest position, wheels locked, and call bell within reach. 

## 2012-06-25 NOTE — Evaluation (Signed)
Clinical/Bedside Swallow Evaluation Patient Details  Name: Joseph Ponce MRN: [id number] Date of Birth: 12/04/1922  Today's Date: 06/25/2012 Time: 1445-1530 SLP Time Calculation (min): 45 min  Past Medical History:  Past Medical History  Diagnosis Date  . Dementia   . Muscle weakness   . Osteoporosis   . Alzheimer disease   . Failure to thrive in adult   . Dementia   . CVA (cerebral vascular accident)     speech and language d/o deficits  . Weakness   . Hyperlipemia   . Dysphagia   . Anemia   . PVD (peripheral vascular disease)   . COPD (chronic obstructive pulmonary disease)   . Glaucoma (increased eye pressure)   . Hypertension   . S/P BKA (below knee amputation) unilateral 01/21/2006    left  . Renal artery stenosis    Past Surgical History:  Past Surgical History  Procedure Date  . Leg amputation below knee     left   HPI:  78 y/o male admitted to ED from SNF with lethargy and change in mental status. CT scan completed on 06/25/12 indicates no acute intracranial abnormality. CXR shows developing infiltration or atelectasis in lung bases.  Patient referred for BSE to assess risk for aspiration secondary to reports from nursing  staff from residence of  "choking"  episodes during administration of medication .     Assessment / Plan / Recommendation Clinical Impression  Patient with decreased LOA but would awaken for brief moments. "wet" cough noted baseline.   Dysphagia indicated with +s/s of aspiration immediately after swallow with thin liquid by cup due to delay in initiation.  No observed +s/s of aspiration with puree trials however patient required max verbal and tactile cues to initiate swallow due to  intermittent LOA.  Recommend to continue NPO status primarily due to lethargy  with exception of medication crushed in puree.  ST to reassess swallow for PO readiness on 06/26/12.    Aspiration Risk  Moderate    Diet Recommendation NPO except meds   Medication  Administration: Crushed with puree    Other  Recommendations     Follow Up Recommendations  Skilled Nursing facility    Frequency and Duration min 2x/week  2 weeks       SLP Swallow Goals Goal #3: Patient will maintain LOA to consume PO trials of various consistencies administered by SLP with no observed clinical s/s of aspiration with moderate assist.     Swallow Study Prior Functional Status   Unknown    General Date of Onset: 06/25/12 HPI: 78 y/o male admitted to ED from SNF with lethargy and change in mental status. CT scan completed on 06/25/12 indicates no acute intracranial abnormality. CXR shows developing infiltration or atelectasis in lung bases.  Patient referred for BSE to assess risk for aspiration secondary to  staff from residence reporting "choking"  episodes during administration of medication .   Type of Study: Bedside swallow evaluation Previous Swallow Assessment: No prior reports in EPIC  Diet Prior to this Study: NPO Temperature Spikes Noted: No Respiratory Status: Supplemental O2 delivered via (comment) History of Recent Intubation: No Behavior/Cognition: Confused;Lethargic;Requires cueing;Decreased sustained attention Oral Cavity - Dentition: Edentulous Self-Feeding Abilities: Total assist Patient Positioning: Upright in bed Baseline Vocal Quality: Wet;Hoarse;Low vocal intensity Volitional Cough: Cognitively unable to elicit Volitional Swallow: Unable to elicit    Oral/Motor/Sensory Function Overall Oral Motor/Sensory Function: Impaired Labial ROM: Reduced right Labial Symmetry: Abnormal symmetry right Labial Strength: Reduced Labial Sensation: Reduced  Lingual ROM: Reduced right Lingual Symmetry: Abnormal symmetry right Lingual Strength: Reduced Lingual Sensation: Reduced Facial ROM: Reduced right Facial Symmetry: Right droop Facial Strength: Reduced Facial Sensation: Reduced Velum: Within Functional Limits Mandible: Within Functional Limits     Ice Chips Ice chips: Not tested   Thin Liquid Thin Liquid: Impaired Presentation: Cup;Spoon Oral Phase Impairments: Reduced labial seal Pharyngeal  Phase Impairments: Suspected delayed Swallow;Decreased hyoid-laryngeal movement;Cough - Immediate;Wet Vocal Quality    Nectar Thick Nectar Thick Liquid: Not tested   Honey Thick Honey Thick Liquid: Not tested   Puree Puree: Impaired Presentation: Spoon Oral Phase Functional Implications: Oral holding;Prolonged oral transit Pharyngeal Phase Impairments: Suspected delayed Swallow;Decreased hyoid-laryngeal movement   Solid     Moreen Fowler MS, CCC-SLP [phone]  Solid: Not tested       [facility] 06/25/2012,4:25 PM

## 2012-06-26 ENCOUNTER — Inpatient Hospital Stay (HOSPITAL_COMMUNITY): Payer: Medicare Other

## 2012-06-26 DIAGNOSIS — J449 Chronic obstructive pulmonary disease, unspecified: Secondary | ICD-10-CM

## 2012-06-26 DIAGNOSIS — D649 Anemia, unspecified: Secondary | ICD-10-CM

## 2012-06-26 DIAGNOSIS — F0390 Unspecified dementia without behavioral disturbance: Secondary | ICD-10-CM

## 2012-06-26 DIAGNOSIS — E860 Dehydration: Secondary | ICD-10-CM

## 2012-06-26 DIAGNOSIS — I2510 Atherosclerotic heart disease of native coronary artery without angina pectoris: Secondary | ICD-10-CM

## 2012-06-26 DIAGNOSIS — R1310 Dysphagia, unspecified: Secondary | ICD-10-CM

## 2012-06-26 DIAGNOSIS — E785 Hyperlipidemia, unspecified: Secondary | ICD-10-CM

## 2012-06-26 DIAGNOSIS — R4182 Altered mental status, unspecified: Secondary | ICD-10-CM

## 2012-06-26 LAB — COMPREHENSIVE METABOLIC PANEL
ALT: 11 U/L (ref 0–53)
AST: 26 U/L (ref 0–37)
CO2: 29 mEq/L (ref 19–32)
Calcium: 10.3 mg/dL (ref 8.4–10.5)
Creatinine, Ser: 1.87 mg/dL — ABNORMAL HIGH (ref 0.50–1.35)
GFR calc Af Amer: 36 mL/min — ABNORMAL LOW (ref >90)
GFR calc non Af Amer: 31 mL/min — ABNORMAL LOW (ref >90)
Glucose, Bld: 94 mg/dL (ref 70–99)
Sodium: 150 mEq/L — ABNORMAL HIGH (ref 135–145)
Total Protein: 6.6 g/dL (ref 6.0–8.3)

## 2012-06-26 LAB — CBC
HCT: 32.7 % — ABNORMAL LOW (ref 39.0–52.0)
Hemoglobin: 10.1 g/dL — ABNORMAL LOW (ref 13.0–17.0)
MCHC: 30.9 g/dL (ref 30.0–36.0)
RBC: 3.78 MIL/uL — ABNORMAL LOW (ref 4.22–5.81)

## 2012-06-26 MED ORDER — POTASSIUM CHLORIDE CRYS ER 20 MEQ PO TBCR
30.0000 meq | EXTENDED_RELEASE_TABLET | Freq: Two times a day (BID) | ORAL | Status: AC
  Administered 2012-06-26 – 2012-06-27 (×3): 30 meq via ORAL
  Filled 2012-06-26 (×3): qty 1

## 2012-06-26 MED ORDER — DEXTROSE 5 % IV SOLN
INTRAVENOUS | Status: DC
  Administered 2012-06-26 – 2012-06-27 (×4): via INTRAVENOUS
  Administered 2012-06-28: 1000 mL via INTRAVENOUS

## 2012-06-26 MED ORDER — DESMOPRESSIN ACE REFRIGERATED 0.01 % NA SOLN
1.0000 [drp] | Freq: Three times a day (TID) | NASAL | Status: DC
  Filled 2012-06-26: qty 2.5

## 2012-06-26 MED ORDER — DESMOPRESSIN ACE SPRAY REFRIG 0.01 % NA SOLN
1.0000 | Freq: Three times a day (TID) | NASAL | Status: DC
  Administered 2012-06-26 – 2012-06-28 (×5): 1 via NASAL
  Filled 2012-06-26: qty 5

## 2012-06-26 MED ORDER — POLYVINYL ALCOHOL 1.4 % OP SOLN
1.0000 [drp] | Freq: Three times a day (TID) | OPHTHALMIC | Status: DC
  Administered 2012-06-26 – 2012-06-28 (×7): 1 [drp] via OPHTHALMIC

## 2012-06-26 NOTE — Progress Notes (Signed)
INITIAL ADULT NUTRITION ASSESSMENT Date: 06/26/2012   Time: 9:46 AM  Reason for Assessment: Nutrition Risk Report + Low Braden  ASSESSMENT: Male 78 y.o.  Dx: HCAP (healthcare-associated pneumonia)  Hx:  Past Medical History  Diagnosis Date  . Dementia   . Muscle weakness   . Osteoporosis   . Alzheimer disease   . Failure to thrive in adult   . Dementia   . CVA (cerebral vascular accident)     speech and language d/o deficits  . Weakness   . Hyperlipemia   . Dysphagia   . Anemia   . PVD (peripheral vascular disease)   . COPD (chronic obstructive pulmonary disease)   . Glaucoma (increased eye pressure)   . Hypertension   . S/P BKA (below knee amputation) unilateral 01/21/2006    left  . Renal artery stenosis    Past Surgical History  Procedure Date  . Leg amputation below knee     left   Related Meds:     . albuterol  2.5 mg Nebulization TID  . amLODipine  10 mg Oral Daily  . antiseptic oral rinse  15 mL Mouth Rinse q12n4p  . aspirin  81 mg Oral Daily  . brimonidine  1 drop Both Eyes BID  . ceFEPime (MAXIPIME) IV  1 g Intravenous Q24H  . chlorhexidine  15 mL Mouth Rinse BID  . clopidogrel  75 mg Oral QAC breakfast  . desmopressin  1 spray Nasal TID  . donepezil  10 mg Oral QHS  . dorzolamide  1 drop Both Eyes BID  . enoxaparin (LOVENOX) injection  30 mg Subcutaneous Q24H  . famotidine  20 mg Oral Daily  . fenofibrate  54 mg Oral Daily  . gabapentin  100 mg Oral BID  . hydroxypropyl methylcellulose  1 drop Both Eyes TID  . levofloxacin (LEVAQUIN) IV  500 mg Intravenous Q48H  . memantine  10 mg Oral BID  . multivitamin with minerals  1 tablet Oral Daily  . nitroGLYCERIN  0.2 mg Transdermal Daily  . omega-3 acid ethyl esters  2 g Oral BID  . polyethylene glycol  17 g Oral Daily  . simvastatin  20 mg Oral QPM  . Travoprost (BAK Free)  1 drop Both Eyes QHS  . vancomycin  1,000 mg Intravenous Q48H  . DISCONTD: albuterol  2.5 mg Nebulization Q6H  . DISCONTD:  brimonidine  1 drop Both Eyes BID   Ht: 5\' 8"  (172.7 cm)  Wt: 167 lb 1.7 oz (75.8 kg)  Ideal Wt: 70 kg Adjusted Ideal Wt for amputation: 74.5 kg % Ideal Wt: 102%  Wt Readings from Last 15 Encounters:  06/25/12 167 lb 1.7 oz (75.8 kg)  Usual Wt: 170 lb (per pt) % Usual Wt: 98%  BMI: 26.7 (using adjusted wt for amputation) - overweight  Food/Nutrition Related Hx: from SNF; has dementia and is confused at baseline; hx of FTT  Labs:  CMP     Component Value Date/Time   NA 150* 06/26/2012 0555   K 3.7 06/26/2012 0555   CL 115* 06/26/2012 0555   CO2 29 06/26/2012 0555   GLUCOSE 94 06/26/2012 0555   BUN 37* 06/26/2012 0555   CREATININE 1.87* 06/26/2012 0555   CALCIUM 10.3 06/26/2012 0555   PROT 6.6 06/26/2012 0555   ALBUMIN 2.5* 06/26/2012 0555   AST 26 06/26/2012 0555   ALT 11 06/26/2012 0555   ALKPHOS 37* 06/26/2012 0555   BILITOT 0.2* 06/26/2012 0555   GFRNONAA 31* 06/26/2012 0555  GFRAA 36* 06/26/2012 0555    Intake/Output Summary (Last 24 hours) at 06/26/12 0949 Last data filed at 06/26/12 0900  Gross per 24 hour  Intake 1873.75 ml  Output    600 ml  Net 1273.75 ml   Diet Order: NPO  Supplements/Tube Feeding: none  IVF:    sodium chloride Last Rate: 75 mL/hr at 06/26/12 0700    Estimated Nutritional Needs:   Kcal: 1700 - 1800 kcal Protein: 70 - 80 grams Fluid: 1.8 - 2 liters daily  Pt from [facility]; noted to have choking spells when given medications. Work-up reveals HCAP.  BSE completed 8/18. Pt with lethargy and inability to sustain alertness. SLP recommending MBS to objectively evaluated swallow function prior to initiating diet.  Pt states usual weight is 170 lb. Current weight is 167 lb. Pt states appetite was fair PTA. States that he has no teeth and no dentures.  Pt is at nutrition risk given advanced age and acute on chronic medical conditions.  No skin breakdown noted at this time.  NUTRITION DIAGNOSIS: -Inadequate oral intake (NI-2.1).  Status:  Ongoing  RELATED TO: inability to eat  AS EVIDENCE BY: NPO status.  MONITORING/EVALUATION(Goals): Goal: Advance diet per SLP. Intake to meet at least 90% of estimated needs. Monitor: weights, labs, diet advancement, I/O's  EDUCATION NEEDS: -No education needs identified at this time  INTERVENTION: 1. RD to continue to follow and assess PO intake with diet advancement; will reassess need for supplements at that time. 2. RD to continue to follow nutrition care plan  DOCUMENTATION CODES Per approved criteria  -Not Applicable   Jarold Motto MS, RD, LDN Pager: [phone] After-hours pager: [phone]

## 2012-06-26 NOTE — Progress Notes (Signed)
Speech Language Pathology Dysphagia Treatment Patient Details Name: Joseph Ponce MRN: [id number] DOB: 12/04/1922 Today's Date: 06/26/2012 Time: 6962-9528 SLP Time Calculation (min): 14 min  Assessment / Plan / Recommendation Clinical Impression  Treatment focused on PO trials to determine readiness for diet vs objective testing. Pt with continued lethargy though able to sustain alertness to PO. Pt with subjective apperance of delayed swallow and decreased elevation of hyolaryngeal complex. No overt signs of aspiraiton. However, given concern for aspiration pna, previous reports of pt chocking and a history of dysphagia ( previous MBS in 2007, though report not acccessible) recommend MBS to objectively evaluate swallow function prior to initiating diet. Pt may continue pills crushed in puree for the time being.     Diet Recommendation  Continue with Current Diet: NPO (except meds crushed in puree)    SLP Plan MBS   Pertinent Vitals/Pain NA   Swallowing Goals     General Temperature Spikes Noted: No Respiratory Status: Supplemental O2 delivered via (comment) Behavior/Cognition: Cooperative;Lethargic Oral Cavity - Dentition: Edentulous Patient Positioning: Upright in bed  Oral Cavity - Oral Hygiene Does patient have any of the following "at risk" factors?: Oxygen therapy - cannula, mask, simple oxygen devices   Dysphagia Treatment Treatment focused on: Upgraded PO texture trials Treatment Methods/Modalities: Skilled observation Patient observed directly with PO's: Yes Type of PO's observed: Thin liquids;Dysphagia 1 (puree) Feeding: Total assist Liquids provided via: Cup Oral Phase Signs & Symptoms: Prolonged oral phase Pharyngeal Phase Signs & Symptoms: Suspected delayed swallow initiation Type of cueing: Verbal Amount of cueing: Minimal   GO     Joseph Ponce, Riley Nearing 06/26/2012, 8:50 AM

## 2012-06-26 NOTE — Progress Notes (Signed)
Utilization review completed.  

## 2012-06-26 NOTE — Progress Notes (Signed)
I spoke with cards Dr. Milas Kocher about pt's vent bigeminy, he recommended checking mg, supplemental K, following and if he develops symptoms or v tach to call back.  Maryln Manuel, MD

## 2012-06-26 NOTE — Progress Notes (Signed)
Notified by monitor tech of patient's new onset Bigeminy.  Dr. Maryln Manuel nortified in person.  Strip given to Dr. Laural Benes.  Will continue to monitor.

## 2012-06-26 NOTE — Procedures (Signed)
Objective Swallowing Evaluation: Modified Barium Swallowing Study  Patient Details  Name: Joseph Ponce MRN: [id number] Date of Birth: 12/04/1922  Today's Date: 06/26/2012 Time: 1115-1140 SLP Time Calculation (min): 25 min  Past Medical History:  Past Medical History  Diagnosis Date  . Dementia   . Muscle weakness   . Osteoporosis   . Alzheimer disease   . Failure to thrive in adult   . Dementia   . CVA (cerebral vascular accident)     speech and language d/o deficits  . Weakness   . Hyperlipemia   . Dysphagia   . Anemia   . PVD (peripheral vascular disease)   . COPD (chronic obstructive pulmonary disease)   . Glaucoma (increased eye pressure)   . Hypertension   . S/P BKA (below knee amputation) unilateral 01/21/2006    left  . Renal artery stenosis    Past Surgical History:  Past Surgical History  Procedure Date  . Leg amputation below knee     left   HPI:  78 y/o male admitted to ED from SNF with lethargy and change in mental status. CT scan completed on 06/25/12 indicates no acute intracranial abnormality. CXR shows developing infiltration or atelectasis in lung bases.  Patient referred for BSE to assess risk for aspiration secondary to  staff from residence reporting "choking"  episodes during administration of medication .  Given pna and complaints, objective study warranted to determine safety with POs.      Assessment / Plan / Recommendation Clinical Impression  Dysphagia Diagnosis: Mild oral phase dysphagia;Mild pharyngeal phase dysphagia Clinical impression: Joseph Ponce presents with mild sensory deficits resulting in a mild oral dysphagia with labored mastication of solid bolsues due to missing dentition and poor oral coordination of mixed consistencies, especially pills, when transiting to pharynx. Pt exhibited a mild, mostly asymptomatic dealy in swallow initiation. One episode of silent aspiraiton occured when attempting to transit a pill with thin liquids.  Otherwise the pt did not penetrate or aspirate even when challenged with large sips. Even so, with altered mentation/lethargy pt is at risk of silent aspriation due to sensaory deficits. Recommend pt initiate a dysphagia 2 diet with thin liquids, pills whole in puree. SLp will f/u for tolerance and possible upgrade as mentation improves.     Treatment Recommendation  Therapy as outlined in treatment plan below    Diet Recommendation Dysphagia 2 (Fine chop);Thin liquid   Liquid Administration via: Cup;No straw Medication Administration: Whole meds with puree Supervision: Staff feed patient Compensations: Slow rate;Small sips/bites Postural Changes and/or Swallow Maneuvers: Seated upright 90 degrees    Other  Recommendations Oral Care Recommendations: Oral care BID   Follow Up Recommendations  Skilled Nursing facility    Frequency and Duration min 2x/week  2 weeks   Pertinent Vitals/Pain NA    SLP Swallow Goals Patient will consume recommended diet without observed clinical signs of aspiration with: Moderate assistance Patient will utilize recommended strategies during swallow to increase swallowing safety with: Moderate cueing   General HPI: 78 y/o male admitted to ED from SNF with lethargy and change in mental status. CT scan completed on 06/25/12 indicates no acute intracranial abnormality. CXR shows developing infiltration or atelectasis in lung bases.  Patient referred for BSE to assess risk for aspiration secondary to  staff from residence reporting "choking"  episodes during administration of medication .  Given pna and complaints, objective study warranted to determine safety with POs.  Type of Study: Modified Barium Swallowing Study Reason for  Referral: Objectively evaluate swallowing function Previous Swallow Assessment: No prior reports in EPIC  Diet Prior to this Study: NPO Temperature Spikes Noted: Yes Respiratory Status: Supplemental O2 delivered via (comment) History of  Recent Intubation: No Behavior/Cognition: Cooperative;Lethargic Oral Cavity - Dentition: Edentulous Oral Motor / Sensory Function: Within functional limits Self-Feeding Abilities: Needs assist Patient Positioning: Upright in chair Baseline Vocal Quality: Clear;Low vocal intensity Volitional Cough: Strong Volitional Swallow: Able to elicit Anatomy: Within functional limits Pharyngeal Secretions: Not observed secondary MBS    Reason for Referral Objectively evaluate swallowing function   Oral Phase     Pharyngeal Phase Pharyngeal Phase: Impaired   Cervical Esophageal Phase    GO    Cervical Esophageal Phase: [facility]    Manjot Hinks, Riley Nearing 06/26/2012, 11:59 AM

## 2012-06-26 NOTE — Progress Notes (Signed)
[facility] Progress Note  06/26/2012   Subjective: Pt without complaints today.  He says he wants to eat.    Objective:  Vital signs in last 24 hours: Filed Vitals:   06/25/12 2106 06/26/12 0558 06/26/12 0845 06/26/12 0929  BP: 120/65 177/73  140/67  Pulse: 66 65  74  Temp: 97.9 F (36.6 C) 97.5 F (36.4 C)  98.2 F (36.8 C)  TempSrc: Axillary Axillary  Oral  Resp: 18 18  20   Height:      Weight: 75.8 kg (167 lb 1.7 oz)     SpO2: 99% 98% 100% 100%   Weight change: -0.1 kg (-3.5 oz)  Intake/Output Summary (Last 24 hours) at 06/26/12 1026 Last data filed at 06/26/12 0900  Gross per 24 hour  Intake 1873.75 ml  Output    600 ml  Net 1273.75 ml   No results found for this basename: HGBA1C   Lab Results  Component Value Date   CREATININE 1.87* 06/26/2012    Review of Systems As above, otherwise all reviewed and reported negative  Physical Exam General - awake, no distress, cooperative HEENT - NCAT, MMM Lungs - BBS CV - normal s1, s2 sounds Abd - soft, nondistended, no masses, nontender Ext - no C/C/E  Lab Results: Results for orders placed during the hospital encounter of 06/24/12 (from the past 24 hour(s))  COMPREHENSIVE METABOLIC PANEL     Status: Abnormal   Collection Time   06/26/12  5:55 AM      Component Value Range   Sodium 150 (*) 135 - 145 mEq/L   Potassium 3.7  3.5 - 5.1 mEq/L   Chloride 115 (*) 96 - 112 mEq/L   CO2 29  19 - 32 mEq/L   Glucose, Bld 94  70 - 99 mg/dL   BUN 37 (*) 6 - 23 mg/dL   Creatinine, Ser 1.47 (*) 0.50 - 1.35 mg/dL   Calcium 82.9  8.4 - 56.2 mg/dL   Total Protein 6.6  6.0 - 8.3 g/dL   Albumin 2.5 (*) 3.5 - 5.2 g/dL   AST 26  0 - 37 U/L   ALT 11  0 - 53 U/L   Alkaline Phosphatase 37 (*) 39 - 117 U/L   Total Bilirubin 0.2 (*) 0.3 - 1.2 mg/dL   GFR calc non Af Amer 31 (*) >90 mL/min   GFR calc Af Amer 36 (*) >90 mL/min  CBC     Status: Abnormal   Collection Time   06/26/12  5:55 AM      Component Value Range   WBC  11.8 (*) 4.0 - 10.5 K/uL   RBC 3.78 (*) 4.22 - 5.81 MIL/uL   Hemoglobin 10.1 (*) 13.0 - 17.0 g/dL   HCT 13.0 (*) 86.5 - 78.4 %   MCV 86.5  78.0 - 100.0 fL   MCH 26.7  26.0 - 34.0 pg   MCHC 30.9  30.0 - 36.0 g/dL   RDW 69.6 (*) 29.5 - 28.4 %   Platelets 196  150 - 400 K/uL    Micro Results: Recent Results (from the past 240 hour(s))  URINE CULTURE     Status: Normal (Preliminary result)   Collection Time   06/25/12  1:56 AM      Component Value Range Status Comment   Specimen Description URINE, CLEAN CATCH   Final    Special Requests NONE   Final    Culture  Setup Time 06/25/2012 11:06   Final  Colony Count >=100,000 COLONIES/ML   Final    Culture ESCHERICHIA COLI   Final    Report Status PENDING   Incomplete   CULTURE, BLOOD (ROUTINE X 2)     Status: Normal (Preliminary result)   Collection Time   06/25/12  2:15 AM      Component Value Range Status Comment   Specimen Description BLOOD LEFT ARM   Final    Special Requests BOTTLES DRAWN AEROBIC AND ANAEROBIC 10CC EA   Final    Culture  Setup Time 06/25/2012 11:07   Final    Culture     Final    Value:        BLOOD CULTURE RECEIVED NO GROWTH TO DATE CULTURE WILL BE HELD FOR 5 DAYS BEFORE ISSUING A FINAL NEGATIVE REPORT   Report Status PENDING   Incomplete   CULTURE, BLOOD (ROUTINE X 2)     Status: Normal (Preliminary result)   Collection Time   06/25/12  2:15 AM      Component Value Range Status Comment   Specimen Description BLOOD LEFT HAND   Final    Special Requests BOTTLES DRAWN AEROBIC ONLY 7CC   Final    Culture  Setup Time 06/25/2012 11:07   Final    Culture     Final    Value:        BLOOD CULTURE RECEIVED NO GROWTH TO DATE CULTURE WILL BE HELD FOR 5 DAYS BEFORE ISSUING A FINAL NEGATIVE REPORT   Report Status PENDING   Incomplete   MRSA PCR SCREENING     Status: Normal   Collection Time   06/25/12  4:22 AM      Component Value Range Status Comment   MRSA by PCR NEGATIVE  NEGATIVE Final     Medications:  Scheduled  Meds:   . albuterol  2.5 mg Nebulization TID  . amLODipine  10 mg Oral Daily  . antiseptic oral rinse  15 mL Mouth Rinse q12n4p  . aspirin  81 mg Oral Daily  . brimonidine  1 drop Both Eyes BID  . ceFEPime (MAXIPIME) IV  1 g Intravenous Q24H  . chlorhexidine  15 mL Mouth Rinse BID  . clopidogrel  75 mg Oral QAC breakfast  . desmopressin  1 spray Nasal TID  . donepezil  10 mg Oral QHS  . dorzolamide  1 drop Both Eyes BID  . enoxaparin (LOVENOX) injection  30 mg Subcutaneous Q24H  . famotidine  20 mg Oral Daily  . fenofibrate  54 mg Oral Daily  . gabapentin  100 mg Oral BID  . hydroxypropyl methylcellulose  1 drop Both Eyes TID  . levofloxacin (LEVAQUIN) IV  500 mg Intravenous Q48H  . memantine  10 mg Oral BID  . multivitamin with minerals  1 tablet Oral Daily  . nitroGLYCERIN  0.2 mg Transdermal Daily  . omega-3 acid ethyl esters  2 g Oral BID  . polyethylene glycol  17 g Oral Daily  . simvastatin  20 mg Oral QPM  . Travoprost (BAK Free)  1 drop Both Eyes QHS  . vancomycin  1,000 mg Intravenous Q48H  . DISCONTD: albuterol  2.5 mg Nebulization Q6H   Continuous Infusions:   . sodium chloride 75 mL/hr at 06/26/12 0700   PRN Meds:.acetaminophen, acetaminophen, albuterol, hydrALAZINE, HYDROcodone-acetaminophen, HYDROmorphone (DILAUDID) injection, ondansetron (ZOFRAN) IV, ondansetron  Assessment/Plan: *HCAP (healthcare-associated pneumonia) - continue IV antibiotics as ordered, repeat CXR in AM  Dehydration - continue IV fluid hydration  Dysphagia -MBS  study ordered for today  Renal failure, acute - improving with hydration  Altered mental status - likely with his dementia, this may be his baseline, will follow, no agitation noted  UTI -continue IV antibiotics  Hypernatremia - change IVFs to D5W  Anemia -monitoring   LOS: 2 days   Syrenity Klepacki 06/26/2012, 10:26 AM  Cleora Fleet, MD, CDE, FAAFP [facility] [place], [place]   [phone]

## 2012-06-27 DIAGNOSIS — J189 Pneumonia, unspecified organism: Principal | ICD-10-CM

## 2012-06-27 LAB — CBC
HCT: 32.1 % — ABNORMAL LOW (ref 39.0–52.0)
Hemoglobin: 10 g/dL — ABNORMAL LOW (ref 13.0–17.0)
MCH: 26.4 pg (ref 26.0–34.0)
MCHC: 31.2 g/dL (ref 30.0–36.0)
MCV: 84.7 fL (ref 78.0–100.0)
RBC: 3.79 MIL/uL — ABNORMAL LOW (ref 4.22–5.81)

## 2012-06-27 LAB — BASIC METABOLIC PANEL
BUN: 30 mg/dL — ABNORMAL HIGH (ref 6–23)
CO2: 28 mEq/L (ref 19–32)
Glucose, Bld: 120 mg/dL — ABNORMAL HIGH (ref 70–99)
Potassium: 3.7 mEq/L (ref 3.5–5.1)
Sodium: 144 mEq/L (ref 135–145)

## 2012-06-27 LAB — URINE CULTURE

## 2012-06-27 MED ORDER — DEXTROSE 5 % IV SOLN
1.0000 g | INTRAVENOUS | Status: DC
  Administered 2012-06-27: 1 g via INTRAVENOUS
  Filled 2012-06-27 (×2): qty 10

## 2012-06-27 MED ORDER — VANCOMYCIN HCL IN DEXTROSE 1-5 GM/200ML-% IV SOLN
1000.0000 mg | INTRAVENOUS | Status: DC
  Filled 2012-06-27: qty 200

## 2012-06-27 NOTE — Evaluation (Signed)
Physical Therapy Evaluation Patient Details Name: Joseph Ponce MRN: [id number] DOB: 12/04/1922 Today's Date: 06/27/2012 Time: 4098-1191 PT Time Calculation (min): 17 min  PT Assessment / Plan / Recommendation Clinical Impression  Pt admitted from [facility] SNF with HCAP. Pt at baseline is at [facility] level and has not ambulated since BKA per staff there. Pt currently able to transfer to chair with setup and minguard assist which is baseline or better for pt. No further therapy needs at this time. Recommend return to SNF for longterm care.     PT Assessment  Patent does not need any further PT services    Follow Up Recommendations  No PT follow up (Return to long term SNF)    Barriers to Discharge        Equipment Recommendations  None recommended by PT    Recommendations for Other Services     Frequency      Precautions / Restrictions Precautions Precautions: Fall Precaution Comments: BKA LLE   Pertinent Vitals/Pain No pain      Mobility  Bed Mobility Bed Mobility: Supine to Sit;Sitting - Scoot to Edge of Bed Supine to Sit: 4: Min guard;HOB elevated;With rails (HOB 25degrees) Sitting - Scoot to Edge of Bed: 4: Min guard Details for Bed Mobility Assistance: cueing for safety and increased time to complete Transfers Transfers: Lateral/Scoot Transfers Lateral/Scoot Transfers: 4: Min guard;With armrests removed Details for Transfer Assistance: pt able to transfer toward his right from bed to recliner with recliner armrest dropped. Pt required assist for equipment set up but no physical assist with transfer only cueing for sequence and safety Ambulation/Gait Ambulation/Gait Assistance: Not tested (comment) (Pt nonambulatory PTA)    Exercises     PT Diagnosis:    PT Problem List:   PT Treatment Interventions:     PT Goals    Visit Information  Last PT Received On: 06/27/12 Assistance Needed: +1    Subjective Data  Subjective: "I can get in the [facility] by myself" Patient  Stated Goal: get up   Prior Functioning  Home Living Lives With: Other (Comment) Available Help at Discharge: Skilled Nursing Facility Type of Home: Skilled Nursing Facility Home Access: Level entry Home Layout: One level Bathroom Shower/Tub: Health visitor: Handicapped height Home Adaptive Equipment: Wheelchair - manual Prior Function Level of Independence: Needs assistance Needs Assistance: Light Housekeeping;Meal Prep;Transfers;Dressing;Bathing;Toileting Bath: Minimal (min assist with sponge baths, mod with showers) Dressing: Minimal Toileting: Minimal Meal Prep: Total Light Housekeeping: Total Transfer Assistance: min assist with transfers Able to Take Stairs?: No Driving: No Vocation: Retired Comments: 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[employer]: HOH    Cognition  Overall Cognitive Status: History of cognitive impairments - at baseline Arousal/Alertness: Awake/alert Orientation Level: Disoriented to;Place;Time Behavior During Session: WFL for tasks performed    Extremity/Trunk Assessment Right Upper Extremity Assessment RUE ROM/Strength/Tone: Deficits RUE ROM/Strength/Tone Deficits: decreased finger extension and bicep activation Left Upper Extremity Assessment LUE ROM/Strength/Tone: Deficits LUE ROM/Strength/Tone Deficits: decreased finger extension and bicep activation Right Lower Extremity Assessment RLE ROM/Strength/Tone: Deficits RLE ROM/Strength/Tone Deficits: grossly 2/5 not formally tested Left Lower Extremity Assessment LLE ROM/Strength/Tone: Deficits LLE ROM/Strength/Tone Deficits: BKA and grossly 2/5 strength   Balance Static Sitting Balance Static Sitting - Balance Support: Bilateral upper extremity supported;Feet supported Static Sitting - Level of Assistance: 5: Stand by assistance Static Sitting - Comment/# of Minutes: 5  End of Session PT - End of Session Activity Tolerance: Patient tolerated treatment  well Patient left: in chair;with call  bell/phone within reach Nurse Communication: Mobility status  GP     Delorse Lek 06/27/2012, 2:32 PM  Delaney Meigs, PT [phone]

## 2012-06-27 NOTE — Progress Notes (Signed)
ANTIBIOTIC CONSULT NOTE - FOLLOW UP  Pharmacy Consult for Vancomycin Indication: rule out pneumonia and empiric UTI coverage  No Known Allergies  Patient Measurements: Height: 5\' 8"  (172.7 cm) Weight: 167 lb 5.3 oz (75.9 kg) IBW/kg (Calculated) : 68.4    Vital Signs: Temp: 99 F (37.2 C) (08/20 0615) Temp src: Oral (08/20 0615) BP: 182/80 mmHg (08/20 0615) Pulse Rate: 72  (08/20 0615) Intake/Output from previous day: 08/19 0701 - 08/20 0700 In: 250 [P.O.:250] Out: 750 [Urine:750] Intake/Output from this shift:    Labs:  Basename 06/27/12 0436 06/26/12 0555 06/25/12 0530  WBC 8.6 11.8* 13.8*  HGB 10.0* 10.1* 11.0*  PLT 200 196 186  LABCREA -- -- --  CREATININE 1.54* 1.87* 2.23*   Estimated Creatinine Clearance: 32.7 ml/min (by C-G formula based on Cr of 1.54). No results found for this basename: VANCOTROUGH:2,VANCOPEAK:2,VANCORANDOM:2,GENTTROUGH:2,GENTPEAK:2,GENTRANDOM:2,TOBRATROUGH:2,TOBRAPEAK:2,TOBRARND:2,AMIKACINPEAK:2,AMIKACINTROU:2,AMIKACIN:2, in the last 72 hours   Microbiology: Recent Results (from the past 720 hour(s))  URINE CULTURE     Status: Normal   Collection Time   06/25/12  1:56 AM      Component Value Range Status Comment   Specimen Description URINE, CLEAN CATCH   Final    Special Requests NONE   Final    Culture  Setup Time 06/25/2012 11:06   Final    Colony Count >=100,000 COLONIES/ML   Final    Culture ESCHERICHIA COLI   Final    Report Status 06/27/2012 FINAL   Final    Organism ID, Bacteria ESCHERICHIA COLI   Final   CULTURE, BLOOD (ROUTINE X 2)     Status: Normal (Preliminary result)   Collection Time   06/25/12  2:15 AM      Component Value Range Status Comment   Specimen Description BLOOD LEFT ARM   Final    Special Requests BOTTLES DRAWN AEROBIC AND ANAEROBIC 10CC EA   Final    Culture  Setup Time 06/25/2012 11:07   Final    Culture     Final    Value:        BLOOD CULTURE RECEIVED NO GROWTH TO DATE CULTURE WILL BE HELD FOR 5 DAYS  BEFORE ISSUING A FINAL NEGATIVE REPORT   Report Status PENDING   Incomplete   CULTURE, BLOOD (ROUTINE X 2)     Status: Normal (Preliminary result)   Collection Time   06/25/12  2:15 AM      Component Value Range Status Comment   Specimen Description BLOOD LEFT HAND   Final    Special Requests BOTTLES DRAWN AEROBIC ONLY 7CC   Final    Culture  Setup Time 06/25/2012 11:07   Final    Culture     Final    Value:        BLOOD CULTURE RECEIVED NO GROWTH TO DATE CULTURE WILL BE HELD FOR 5 DAYS BEFORE ISSUING A FINAL NEGATIVE REPORT   Report Status PENDING   Incomplete   MRSA PCR SCREENING     Status: Normal   Collection Time   06/25/12  4:22 AM      Component Value Range Status Comment   MRSA by PCR NEGATIVE  NEGATIVE Final     Anti-infectives     Start     Dose/Rate Route Frequency Ordered Stop   06/27/12 0400   levofloxacin (LEVAQUIN) IVPB 500 mg        500 mg 100 mL/hr over 60 Minutes Intravenous Every 48 hours 06/25/12 0504     06/26/12 0200  levofloxacin (LEVAQUIN) IVPB 500 mg  Status:  Discontinued        500 mg 100 mL/hr over 60 Minutes Intravenous Every 24 hours 06/25/12 0452 06/25/12 0504   06/25/12 1000   ceFEPIme (MAXIPIME) 1 g in dextrose 5 % 50 mL IVPB  Status:  Discontinued        1 g 100 mL/hr over 30 Minutes Intravenous Every 12 hours 06/25/12 0452 06/25/12 0503   06/25/12 0600   vancomycin (VANCOCIN) IVPB 1000 mg/200 mL premix        1,000 mg 200 mL/hr over 60 Minutes Intravenous Every 48 hours 06/25/12 0456     06/25/12 0600   ceFEPIme (MAXIPIME) 1 g in dextrose 5 % 50 mL IVPB        1 g 100 mL/hr over 30 Minutes Intravenous Every 24 hours 06/25/12 0503     06/25/12 0315   levofloxacin (LEVAQUIN) IVPB 750 mg        750 mg 100 mL/hr over 90 Minutes Intravenous  Once 06/25/12 0303 06/25/12 0527   06/25/12 0100   azithromycin (ZITHROMAX) 500 mg in dextrose 5 % 250 mL IVPB        500 mg 250 mL/hr over 60 Minutes Intravenous  Once 06/25/12 0056 06/25/12 0315    06/25/12 0100   cefTRIAXone (ROCEPHIN) 1 g in dextrose 5 % 50 mL IVPB        1 g 100 mL/hr over 30 Minutes Intravenous  Once 06/25/12 0056 06/25/12 0354          Assessment: 78 y.o. M admitted on 8/18 with AMS and started on Vancomycin + Cefepime + Levaquin for empiric UTI and HCAP coverage. Urine cultures were positive for E. Coli that is pan-sensitive. Blood cultures are ngtd and no respiratory cultures have been taken.  The patient was admitted with ARI which has now resolved. SCr down to 1.54, CrCl~30-35 ml/min. Will go ahead and adjust Vancomycin dose today -- however would expect antibiotics to be de-escalated soon.   Since today is D#3 of empiric antibiotics and only positive culture results are pan-sensitive E.coli UTI -- would recommend to de-escalate therapy to Rocephin only (while IV antibiotics since desired). Multiple oral agents are also available to treat this infection (i.e. bactrim, cefotetan, cefuroxime, ampicillin)  Goal of Therapy:  Eradication of Infection  Plan:  1. Increase Vancomycin to 1g IV every 76 hours 2. Consider antibiotic de-escalation (see note above) 3. Will continue to follow renal function, culture results, LOT, and antibiotic de-escalation plans   Georgina Pillion, PharmD, BCPS Clinical Pharmacist Pager: [phone] 06/27/2012 8:37 AM

## 2012-06-27 NOTE — Progress Notes (Signed)
[facility] Progress Note  06/27/2012  Subjective: Pt sitting up in chair today and eating, no complaints.   Objective:  Vital signs in last 24 hours: Filed Vitals:   06/27/12 1416 06/27/12 1424 06/27/12 1425 06/27/12 1759  BP: 174/80   152/70  Pulse: 65   83  Temp: 99 F (37.2 C)   98.2 F (36.8 C)  TempSrc: Oral   Oral  Resp: 18   18  Height:      Weight:      SpO2: 95% 97% 97% 97%   Weight change: 0.1 kg (3.5 oz)  Intake/Output Summary (Last 24 hours) at 06/27/12 1835 Last data filed at 06/27/12 1820  Gross per 24 hour  Intake    870 ml  Output   1550 ml  Net   -680 ml   No results found for this basename: HGBA1C   Lab Results  Component Value Date   CREATININE 1.54* 06/27/2012    Review of Systems As above, otherwise all reviewed and reported negative  Physical Exam General - awake, no distress, cooperative HEENT - NCAT, MMM Lungs - BBS, CTA CV - normal s1, s2 sounds Abd - soft, nondistended, no masses, nontender Ext - no C/C/E  Lab Results: Results for orders placed during the hospital encounter of 06/24/12 (from the past 24 hour(s))  BASIC METABOLIC PANEL     Status: Abnormal   Collection Time   06/27/12  4:36 AM      Component Value Range   Sodium 144  135 - 145 mEq/L   Potassium 3.7  3.5 - 5.1 mEq/L   Chloride 108  96 - 112 mEq/L   CO2 28  19 - 32 mEq/L   Glucose, Bld 120 (*) 70 - 99 mg/dL   BUN 30 (*) 6 - 23 mg/dL   Creatinine, Ser 1.61 (*) 0.50 - 1.35 mg/dL   Calcium 9.8  8.4 - 09.6 mg/dL   GFR calc non Af Amer 39 (*) >90 mL/min   GFR calc Af Amer 45 (*) >90 mL/min  CBC     Status: Abnormal   Collection Time   06/27/12  4:36 AM      Component Value Range   WBC 8.6  4.0 - 10.5 K/uL   RBC 3.79 (*) 4.22 - 5.81 MIL/uL   Hemoglobin 10.0 (*) 13.0 - 17.0 g/dL   HCT 04.5 (*) 40.9 - 81.1 %   MCV 84.7  78.0 - 100.0 fL   MCH 26.4  26.0 - 34.0 pg   MCHC 31.2  30.0 - 36.0 g/dL   RDW 91.4  78.2 - 95.6 %   Platelets 200  150 - 400 K/uL     Micro Results: Recent Results (from the past 240 hour(s))  URINE CULTURE     Status: Normal   Collection Time   06/25/12  1:56 AM      Component Value Range Status Comment   Specimen Description URINE, CLEAN CATCH   Final    Special Requests NONE   Final    Culture  Setup Time 06/25/2012 11:06   Final    Colony Count >=100,000 COLONIES/ML   Final    Culture ESCHERICHIA COLI   Final    Report Status 06/27/2012 FINAL   Final    Organism ID, Bacteria ESCHERICHIA COLI   Final   CULTURE, BLOOD (ROUTINE X 2)     Status: Normal (Preliminary result)   Collection Time   06/25/12  2:15 AM  Component Value Range Status Comment   Specimen Description BLOOD LEFT ARM   Final    Special Requests BOTTLES DRAWN AEROBIC AND ANAEROBIC 10CC EA   Final    Culture  Setup Time 06/25/2012 11:07   Final    Culture     Final    Value:        BLOOD CULTURE RECEIVED NO GROWTH TO DATE CULTURE WILL BE HELD FOR 5 DAYS BEFORE ISSUING A FINAL NEGATIVE REPORT   Report Status PENDING   Incomplete   CULTURE, BLOOD (ROUTINE X 2)     Status: Normal (Preliminary result)   Collection Time   06/25/12  2:15 AM      Component Value Range Status Comment   Specimen Description BLOOD LEFT HAND   Final    Special Requests BOTTLES DRAWN AEROBIC ONLY 7CC   Final    Culture  Setup Time 06/25/2012 11:07   Final    Culture     Final    Value:        BLOOD CULTURE RECEIVED NO GROWTH TO DATE CULTURE WILL BE HELD FOR 5 DAYS BEFORE ISSUING A FINAL NEGATIVE REPORT   Report Status PENDING   Incomplete   MRSA PCR SCREENING     Status: Normal   Collection Time   06/25/12  4:22 AM      Component Value Range Status Comment   MRSA by PCR NEGATIVE  NEGATIVE Final     Medications:  Scheduled Meds:   . albuterol  2.5 mg Nebulization TID  . amLODipine  10 mg Oral Daily  . antiseptic oral rinse  15 mL Mouth Rinse q12n4p  . aspirin  81 mg Oral Daily  . brimonidine  1 drop Both Eyes BID  . cefTRIAXone (ROCEPHIN)  IV  1 g  Intravenous Q24H  . chlorhexidine  15 mL Mouth Rinse BID  . clopidogrel  75 mg Oral QAC breakfast  . desmopressin  1 spray Nasal TID  . donepezil  10 mg Oral QHS  . dorzolamide  1 drop Both Eyes BID  . enoxaparin (LOVENOX) injection  30 mg Subcutaneous Q24H  . famotidine  20 mg Oral Daily  . fenofibrate  54 mg Oral Daily  . gabapentin  100 mg Oral BID  . memantine  10 mg Oral BID  . multivitamin with minerals  1 tablet Oral Daily  . nitroGLYCERIN  0.2 mg Transdermal Daily  . omega-3 acid ethyl esters  2 g Oral BID  . polyethylene glycol  17 g Oral Daily  . polyvinyl alcohol  1 drop Both Eyes TID  . potassium chloride  30 mEq Oral BID  . simvastatin  20 mg Oral QPM  . Travoprost (BAK Free)  1 drop Both Eyes QHS  . DISCONTD: ceFEPime (MAXIPIME) IV  1 g Intravenous Q24H  . DISCONTD: levofloxacin (LEVAQUIN) IV  500 mg Intravenous Q48H  . DISCONTD: vancomycin  1,000 mg Intravenous Q48H  . DISCONTD: vancomycin  1,000 mg Intravenous Q24H   Continuous Infusions:   . dextrose 75 mL/hr at 06/27/12 1505   PRN Meds:.acetaminophen, acetaminophen, albuterol, hydrALAZINE, HYDROcodone-acetaminophen, HYDROmorphone (DILAUDID) injection, ondansetron (ZOFRAN) IV, ondansetron  Assessment/Plan: *HCAP (healthcare-associated pneumonia)  - symptoms improved -de-escalate antibiotics today -rocephin 1 gm IV  Dehydration  - continue IV fluid hydration today   Dysphagia  -MBS study: Mild oral phase dysphagia; Dysphagia 2; thin liquids, Whole meds with puree    Renal failure, acute  - improving with hydration   Altered mental  status  - likely with his dementia, this may be his baseline, will follow, no agitation noted   UTI - E. coli -IV rocephin  Hypernatremia  - change IVFs to D5W   Anemia  -monitoring   Dispo: Possible DC to return to SNF tomorrow if stable    LOS: 3 days   Shasha Buchbinder 06/27/2012, 6:35 PM  Cleora Fleet, MD, CDE, FAAFP [facility] [place], [place]  [phone]

## 2012-06-27 NOTE — Clinical Social Work Psychosocial (Signed)
Clinical Social Work Department BRIEF PSYCHOSOCIAL ASSESSMENT 06/27/2012  Patient:  Joseph Ponce, Joseph Ponce     Account Number:  [id number]     Admit date:  06/24/2012  Clinical Social Worker:  Delmer Islam  Date/Time:  06/27/2012 04:19 AM  Referred by:  RN  Date Referred:  06/26/2012 Referred for  SNF Placement   Other Referral:   Interview type:  Other - See comment Other interview type:   Staff at [facility]    PSYCHOSOCIAL DATA Living Status:  FACILITY Admitted from facility:  [facility] Level of care:  Skilled Nursing Facility Primary support name:   Primary support relationship to patient:   Degree of support available:    CURRENT CONCERNS Current Concerns  Post-Acute Placement   Other Concerns:    SOCIAL WORK ASSESSMENT / PLAN CSW spoke with staff at [facility] and was advised that patient is a long-term care resident at the facility, having been there since 01/24/06.   Assessment/plan status:  Psychosocial Support/Ongoing Assessment of Needs Other assessment/ plan:   Information/referral to community resources:    PATIENT'S/FAMILY'S RESPONSE TO PLAN OF CARE: CSW will make contact with family regarding return to SNF when medically stable.

## 2012-06-28 DIAGNOSIS — G9340 Encephalopathy, unspecified: Secondary | ICD-10-CM | POA: Diagnosis present

## 2012-06-28 DIAGNOSIS — N390 Urinary tract infection, site not specified: Secondary | ICD-10-CM

## 2012-06-28 DIAGNOSIS — B9620 Unspecified Escherichia coli [E. coli] as the cause of diseases classified elsewhere: Secondary | ICD-10-CM | POA: Diagnosis present

## 2012-06-28 DIAGNOSIS — A498 Other bacterial infections of unspecified site: Secondary | ICD-10-CM

## 2012-06-28 MED ORDER — CEFUROXIME AXETIL 250 MG PO TABS
250.0000 mg | ORAL_TABLET | Freq: Two times a day (BID) | ORAL | Status: DC
  Administered 2012-06-28: 250 mg via ORAL
  Filled 2012-06-28 (×3): qty 1

## 2012-06-28 MED ORDER — HYDROCODONE-ACETAMINOPHEN 5-500 MG PO TABS: 1.0000 | ORAL_TABLET | Freq: Four times a day (QID) | ORAL | Status: DC

## 2012-06-28 MED ORDER — CEFUROXIME AXETIL 250 MG PO TABS: 250.0000 mg | ORAL_TABLET | Freq: Two times a day (BID) | ORAL | Status: AC

## 2012-06-28 NOTE — Progress Notes (Signed)
RT assessment done at this time. I feel as if he would be ok just being PRN neb. BBS clear and slightly diminished, SAT 95% on RA. No distress noted. RT will continue to monitor.

## 2012-06-28 NOTE — Discharge Summary (Signed)
Joseph Ponce MRN: [id number] DOB/AGE: 76/27/1924 76 y.o.  Admit date: 06/24/2012 Discharge date: 06/28/2012  Primary Care Physician:  [name], MD   Discharge Diagnoses:   Patient Active Problem List  Diagnosis  . HCAP (healthcare-associated pneumonia)  . Dehydration  . Renal failure, acute  . Altered mental status  . Dementia  . Anemia  . Hypernatremia  . UTI (lower urinary tract infection)  . Renal artery stenosis  . S/P BKA (below knee amputation) unilateral  . Hypertension  . Glaucoma (increased eye pressure)  . COPD (chronic obstructive pulmonary disease)  . PVD (peripheral vascular disease)  . Dysphagia  . Hyperlipemia  . Weakness  . Pneumonia  . CAD (coronary artery disease)  . Dyslipidemia    DISCHARGE MEDICATION: Medication List  As of 06/28/2012 11:06 AM   TAKE these medications         amLODipine 10 MG tablet   Commonly known as: NORVASC   Take 10 mg by mouth daily.      aspirin 81 MG chewable tablet   Chew 81 mg by mouth daily.      brimonidine 0.1 % Soln   Commonly known as: ALPHAGAN P   Place 1 drop into both eyes 2 (two) times daily.      calcium-vitamin D 500-200 MG-UNIT per tablet   Commonly known as: OSCAL WITH D   Take 1 tablet by mouth 2 (two) times daily.      cefUROXime 250 MG tablet   Commonly known as: CEFTIN   Take 1 tablet (250 mg total) by mouth 2 (two) times daily with a meal.      clopidogrel 75 MG tablet   Commonly known as: PLAVIX   Take 75 mg by mouth daily.      denosumab 60 MG/ML Soln injection   Commonly known as: PROLIA   Inject 60 mg into the skin every 6 (six) months. Next dose due 08/2012      desmopressin 0.01 % Soln   Commonly known as: DDAVP   Place 1 spray into the nose 3 (three) times daily.      donepezil 10 MG tablet   Commonly known as: ARICEPT   Take 10 mg by mouth at bedtime as needed.      dorzolamide 2 % ophthalmic solution   Commonly known as: TRUSOPT   Place 1 drop into both eyes 2  (two) times daily.      famotidine 20 MG tablet   Commonly known as: PEPCID   Take 20 mg by mouth daily.      fenofibrate 48 MG tablet   Commonly known as: TRICOR   Take 48 mg by mouth daily.      gabapentin 100 MG capsule   Commonly known as: NEURONTIN   Take 100 mg by mouth 2 (two) times daily.      HYDROcodone-acetaminophen 5-500 MG per tablet   Commonly known as: VICODIN   Take 1 tablet by mouth 4 (four) times daily.      hydroxypropyl methylcellulose 2.5 % ophthalmic solution   Commonly known as: ISOPTO TEARS   Place 1 drop into both eyes 3 (three) times daily.      magnesium hydroxide 400 MG/5ML suspension   Commonly known as: MILK OF MAGNESIA   Take 30 mLs by mouth daily as needed. For constipation      memantine 10 MG tablet   Commonly known as: NAMENDA   Take 10 mg by mouth 2 (two) times daily.  multivitamin with minerals Tabs   Take 1 tablet by mouth daily.      nitroGLYCERIN 0.2 mg/hr   Commonly known as: NITRODUR - Dosed in mg/24 hr   Place 1 patch onto the skin daily.      omega-3 acid ethyl esters 1 G capsule   Commonly known as: LOVAZA   Take 2 g by mouth 2 (two) times daily.      permethrin 5 % cream   Commonly known as: ELIMITE   Apply 1 application topically once. Apply to body on 06/10/12, then repeat 06/17/12      polyethylene glycol packet   Commonly known as: MIRALAX / GLYCOLAX   Take 17 g by mouth daily.      simvastatin 20 MG tablet   Commonly known as: ZOCOR   Take 20 mg by mouth every evening.      Travoprost (BAK Free) 0.004 % Soln ophthalmic solution   Commonly known as: TRAVATAN   Place 1 drop into both eyes at bedtime.            SIGNIFICANT DIAGNOSTIC STUDIES:  Dg Chest 2 View  06/25/2012  *RADIOLOGY REPORT*  Clinical Data: Cough.  Altered mental status.  CHEST - 2 VIEW  Comparison: 04/11/2010  Findings: Shallow inspiration.  Mild cardiac enlargement with normal pulmonary vascularity.  Since the previous study, there is  developing focal opacity in the lung bases, most prominently on the left, suggesting developing atelectasis or infiltration.  No blunting of costophrenic angles.  No pneumothorax.  Mediastinal contours appear intact.  Old left rib fractures.  Degenerative changes in the spine and shoulders.  IMPRESSION: Developing infiltration or atelectasis in the lung bases.  Original Report Authenticated By: Marlon Pel, M.D.   Ct Head Wo Contrast  06/25/2012  *RADIOLOGY REPORT*  Clinical Data: Altered mental status.  CT HEAD WITHOUT CONTRAST  Technique:  Contiguous axial images were obtained from the base of the skull through the vertex without contrast.  Comparison: 07/12/2009  Findings: There is atrophy and chronic small vessel disease changes. No acute intracranial abnormality.  Specifically, no hemorrhage, hydrocephalus, mass lesion, acute infarction, or significant intracranial injury.  No acute calvarial abnormality. Visualized paranasal sinuses and mastoids clear.  Orbital soft tissues unremarkable.  IMPRESSION: No acute intracranial abnormality.  Atrophy, chronic microvascular disease.  Original Report Authenticated By: Cyndie Chime, M.D.   Dg Chest Port 1 View  06/26/2012  *RADIOLOGY REPORT*  Clinical Data: Shortness of breath and follow up atelectasis.  PORTABLE CHEST - 1 VIEW  Comparison: 06/25/2012  Findings: Portable view of the chest again demonstrates a prominent thoracic aorta and aortic arch which is unchanged.  There are patchy densities at the lung bases, left side greater than right. The left basilar disease may have slightly progressed.  Evidence of old left rib fractures.  Heart size is stable.  No evidence for a pneumothorax.  IMPRESSION: Persistent bibasilar lung densities.  There may have been slight progression at the left lung base.  Differential includes atelectasis versus infection.   Original Report Authenticated By: Richarda Overlie, M.D. ( 06/26/2012 07:59:14 )    Dg Swallowing Func-no  Report  06/26/2012  CLINICAL DATA: dysphagia   FLUOROSCOPY FOR SWALLOWING FUNCTION STUDY:  Fluoroscopy was provided for swallowing function study, which was  administered by a speech pathologist.  Final results and recommendations  from this study are contained within the speech pathology report.       Recent Results (from the past 240 hour(s))  URINE CULTURE     Status: Normal   Collection Time   06/25/12  1:56 AM      Component Value Range Status Comment   Specimen Description URINE, CLEAN CATCH   Final    Special Requests NONE   Final    Culture  Setup Time 06/25/2012 11:06   Final    Colony Count >=100,000 COLONIES/ML   Final    Culture ESCHERICHIA COLI   Final    Report Status 06/27/2012 FINAL   Final    Organism ID, Bacteria ESCHERICHIA COLI   Final   CULTURE, BLOOD (ROUTINE X 2)     Status: Normal (Preliminary result)   Collection Time   06/25/12  2:15 AM      Component Value Range Status Comment   Specimen Description BLOOD LEFT ARM   Final    Special Requests BOTTLES DRAWN AEROBIC AND ANAEROBIC 10CC EA   Final    Culture  Setup Time 06/25/2012 11:07   Final    Culture     Final    Value:        BLOOD CULTURE RECEIVED NO GROWTH TO DATE CULTURE WILL BE HELD FOR 5 DAYS BEFORE ISSUING A FINAL NEGATIVE REPORT   Report Status PENDING   Incomplete   CULTURE, BLOOD (ROUTINE X 2)     Status: Normal (Preliminary result)   Collection Time   06/25/12  2:15 AM      Component Value Range Status Comment   Specimen Description BLOOD LEFT HAND   Final    Special Requests BOTTLES DRAWN AEROBIC ONLY [facility]   Final    Culture  Setup Time 06/25/2012 11:07   Final    Culture     Final    Value:        BLOOD CULTURE RECEIVED NO GROWTH TO DATE CULTURE WILL BE HELD FOR 5 DAYS BEFORE ISSUING A FINAL NEGATIVE REPORT   Report Status PENDING   Incomplete   MRSA PCR SCREENING     Status: Normal   Collection Time   06/25/12  4:22 AM      Component Value Range Status Comment   MRSA by PCR NEGATIVE   NEGATIVE Final     BRIEF ADMITTING H & P: Please note that the patient was unable to provide any detail regarding his HPI.  Caylon Saine is an 78 y.o. Male from the [facility] who was increasingly lethargic over the day. He was noted by staff to have a choking spells when he was given his medications. He was described as not being himself which is interactive and verbal, however he has dementia and is confused at baseline. There was no report of fevers or chills or nausea or vomiting.     Hospital Course:  Present on Admission:  .HCAP (healthcare-associated pneumonia): This is a patient who was admitted from a residential healthcare setting with complaints of altered mental status. In addition he was treated as a health care acquired pneumonia. It is unclear as to whether or not this is really H. Versus atelectasis. Nevertheless the patient does have risk factors that would allow for aspiration. The patient was initially treated with Rocephin and is being transitioned over to Ceftin for a total of 4 days to complete a seven-day course of therapy. Clinically he has improved considerably at this time does not require any oxygen.   .Dehydration: The patient was admitted with moderate dehydration which was reflected in hemoconcentration as well as acute renal failure. Additionally  the patient's clinical examination is consistent with dehydration. He received IV fluids initially which was then discontinued the patient this point is able to maintain his hydration without any artificial means   .Renal failure, acute: Patient presented with acute renal failure with creatinine of 2.57. At the time of discharge the patient's creatinine is 1.5 which is essentially at baseline compared to his creatinine on 04/11/2010 which is 1.7.   Marland KitchenAltered mental status: The patient had a decrease mental status which was attributed to the toxic encephalopathy associated with his infection. He is now recovered and is at his  baseline.   Marland KitchenUTI (lower urinary tract infection): The patient was found to have an Escherichia coli urinary tract infection which is pansensitive. The patient has already received 4 days of Rocephin and will continue for additional 4 days  .Dementia: Patient at his baseline continue on prehospital medications   .Hypernatremia:: Due to his state of dehydration on admission the patient was hemoconcentrated with a sodium of 154. At this time the patient has a normal serum sodium of 144.   Marland KitchenAnemia: Patient has a history of chronic anemia. Hemoglobin is stable throughout this hospitalization   .Dysphagia: Patient underwent modified barium swallow was found mild oral phase dysphagia and mild pharyngeal phase dysphagia. Recommendations are for a dysphagia 2 diet with thin liquids, and chills 2 be given whole in pure. The patient has been tolerating his diet without difficulty.    Condition at the time of discharge: Good   Disposition and Follow-up: Patient is being discharged back to [facility] today. He's to followup with the facility physician within 72 hours    DISCHARGE EXAM:  General: Alert, awake,and in no acute distress.  Vital Signs:Blood pressure 162/64, pulse 77, temperature 98.9 F (37.2 C), temperature source Oral, resp. rate 18, height 5\' 8"  (1.727 m), weight 78.2 kg (172 lb 6.4 oz), SpO2 92.00%. HEENT: [place]/AT PEERL, EOMI OROPHARYNX:  Moist, No exudate/ erythema/lesions.  Heart: Regular rate and rhythm Lungs: Clear to auscultation  Abdomen: Soft, nontender, nondistended, positive bowel sounds, no masses no hepatosplenomegaly noted.  Neuro: No focal neurological deficits noted cranial nerves II through XII grossly intact.  Musculoskeletal: No warm swelling or erythema around joints, no spinal tenderness noted. Psychiatric: Patient alert and oriented to self and place.    Basename 06/27/12 0436 06/26/12 0555  NA 144 150*  K 3.7 3.7  CL 108 115*  CO2 28 29  GLUCOSE 120* 94    BUN 30* 37*  CREATININE 1.54* 1.87*  CALCIUM 9.8 10.3  MG -- --  PHOS -- --    Basename 06/26/12 0555  AST 26  ALT 11  ALKPHOS 37*  BILITOT 0.2*  PROT 6.6  ALBUMIN 2.5*   No results found for this basename: LIPASE:2,AMYLASE:2 in the last 72 hours  Basename 06/27/12 0436 06/26/12 0555  WBC 8.6 11.8*  NEUTROABS -- --  HGB 10.0* 10.1*  HCT 32.1* 32.7*  MCV 84.7 86.5  PLT 200 196   Total Time for discharge process including face-to-face time greater than 30 minutes   Signed: Jenniferann Stuckert A. 06/28/2012, 11:06 AM

## 2012-06-28 NOTE — Progress Notes (Signed)
Clinical Child psychotherapist (CSW) faxed pt dc summary/AVS to facility and prepared and placed dc packet in pt shadow chart. CSW contacted [facility] for a non emergency ambulance to transport pt to facility at 14:00 today. CSW will provide RN with contact information to give report to [place]. CSW signing off, no further CSW needs addressed.  Theresia Bough, MSW, [facility] [phone]

## 2012-06-28 NOTE — Progress Notes (Signed)
Covering Clinical Child psychotherapist (CSW) informed that pt ready to dc back to [facility]. CSW contacted the facility and spoke to [place] who informed CSW that pt is okay to return. CSW will facilitate with dc today.  Theresia Bough, MSW, [facility] [phone]

## 2012-07-01 LAB — CULTURE, BLOOD (ROUTINE X 2): Culture: NO GROWTH

## 2013-01-26 ENCOUNTER — Encounter: Payer: Self-pay | Admitting: Adult Health

## 2013-01-26 ENCOUNTER — Non-Acute Institutional Stay (SKILLED_NURSING_FACILITY): Payer: Medicare Other | Admitting: Adult Health

## 2013-01-26 DIAGNOSIS — F0390 Unspecified dementia without behavioral disturbance: Secondary | ICD-10-CM

## 2013-01-26 DIAGNOSIS — M810 Age-related osteoporosis without current pathological fracture: Secondary | ICD-10-CM | POA: Insufficient documentation

## 2013-01-26 DIAGNOSIS — I10 Essential (primary) hypertension: Secondary | ICD-10-CM

## 2013-01-26 DIAGNOSIS — E785 Hyperlipidemia, unspecified: Secondary | ICD-10-CM

## 2013-01-26 DIAGNOSIS — E870 Hyperosmolality and hypernatremia: Secondary | ICD-10-CM

## 2013-01-26 DIAGNOSIS — H409 Unspecified glaucoma: Secondary | ICD-10-CM

## 2013-01-26 DIAGNOSIS — I2510 Atherosclerotic heart disease of native coronary artery without angina pectoris: Secondary | ICD-10-CM

## 2013-01-26 DIAGNOSIS — G894 Chronic pain syndrome: Secondary | ICD-10-CM | POA: Insufficient documentation

## 2013-01-26 DIAGNOSIS — K5900 Constipation, unspecified: Secondary | ICD-10-CM

## 2013-01-26 MED ORDER — HYDROCODONE-ACETAMINOPHEN 5-325 MG PO TABS: 1.0000 | ORAL_TABLET | Freq: Four times a day (QID) | ORAL | Status: DC

## 2013-01-26 NOTE — Assessment & Plan Note (Signed)
His pain is being managed; is taking vicodin 5/325 mg four times daily is taking neurontin 100 mg twice daily

## 2013-01-26 NOTE — Assessment & Plan Note (Signed)
Is stable is taking norvasc 10 mg daily

## 2013-01-26 NOTE — Assessment & Plan Note (Signed)
Is stable is taking alphagan to both eyes twice daily trusopt to both eyes twice daily isopto to both eyes three times daily  travatan z to both eyes nightly and liquids tears to both eyes four times daily

## 2013-01-26 NOTE — Assessment & Plan Note (Signed)
Is stable is taking miralax daily  

## 2013-01-26 NOTE — Assessment & Plan Note (Signed)
Is without change in status is taking prolia every 6 months and is taking ca++ with d twice daily

## 2013-01-26 NOTE — Assessment & Plan Note (Addendum)
Is stable is taking a nitroglycerin patch 0.2 mg dialy is taking asa 81 mg daily takes plavix daily

## 2013-01-26 NOTE — Assessment & Plan Note (Signed)
Without change in status is taking namenda 10 mg twice daily takes Aricept 10 mg daily

## 2013-01-26 NOTE — Assessment & Plan Note (Addendum)
Is stable his sodium is 138; is taking ddavp nasally three times daily

## 2013-01-26 NOTE — Progress Notes (Signed)
Subjective:     Patient ID: Joseph Ponce, male   DOB: 12/04/1922, 79 y.o.   MRN: [id number] Chief Complaint  Patient presents with  . Medical Managment of Chronic Issues    HPI Hypernatremia Is stable his sodium is 138; is taking ddavp nasally three times daily    Glaucoma (increased eye pressure) Is stable is taking alphagan to both eyes twice daily trusopt to both eyes twice daily isopto to both eyes three times daily  travatan z to both eyes nightly and liquids tears to both eyes four times daily   CAD (coronary artery disease) Is stable is taking a nitroglycerin patch 0.2 mg dialy is taking asa 81 mg daily takes plavix daily   Hypertension Is stable is taking norvasc 10 mg daily   Unspecified constipation Is stable is taking miralax daily   Osteoporosis, unspecified Is without change in status is taking prolia every 6 months and is taking ca++ with d twice daily   Chronic pain syndrome His pain is being managed; is taking vicodin 5/325 mg four times daily is taking neurontin 100 mg twice daily   Dementia Without change in status is taking namenda 10 mg twice daily takes Aricept 10 mg daily     Current outpatient prescriptions:amLODipine (NORVASC) 10 MG tablet, Take 10 mg by mouth daily., Disp: , Rfl: ;  aspirin 81 MG chewable tablet, Chew 81 mg by mouth daily., Disp: , Rfl: ;  brimonidine (ALPHAGAN P) 0.1 % SOLN, Place 1 drop into both eyes 2 (two) times daily., Disp: , Rfl: ;  calcium-vitamin D (OSCAL WITH D) 500-200 MG-UNIT per tablet, Take 1 tablet by mouth 2 (two) times daily., Disp: , Rfl:  clopidogrel (PLAVIX) 75 MG tablet, Take 75 mg by mouth daily., Disp: , Rfl: ;  denosumab (PROLIA) 60 MG/ML SOLN injection, Inject 60 mg into the skin every 6 (six) months. Next dose due April 2014, Disp: , Rfl: ;  desmopressin (DDAVP) 0.01 % SOLN, Place 1 spray into the nose 3 (three) times daily., Disp: , Rfl: ;  donepezil (ARICEPT) 10 MG tablet, Take 10 mg by mouth at bedtime as  needed., Disp: , Rfl:  dorzolamide (TRUSOPT) 2 % ophthalmic solution, Place 1 drop into both eyes 2 (two) times daily., Disp: , Rfl: ;  famotidine (PEPCID) 20 MG tablet, Take 20 mg by mouth daily., Disp: , Rfl: ;  fenofibrate (TRICOR) 48 MG tablet, Take 48 mg by mouth daily., Disp: , Rfl: ;  gabapentin (NEURONTIN) 100 MG capsule, Take 100 mg by mouth 2 (two) times daily., Disp: , Rfl:  hydroxypropyl methylcellulose (ISOPTO TEARS) 2.5 % ophthalmic solution, Place 1 drop into both eyes 3 (three) times daily., Disp: , Rfl: ;  magnesium hydroxide (MILK OF MAGNESIA) 400 MG/5ML suspension, Take 30 mLs by mouth daily as needed. For constipation, Disp: , Rfl: ;  memantine (NAMENDA) 10 MG tablet, Take 10 mg by mouth 2 (two) times daily., Disp: , Rfl:  Multiple Vitamin (MULTIVITAMIN WITH MINERALS) TABS, Take 1 tablet by mouth daily., Disp: , Rfl: ;  nitroGLYCERIN (NITRODUR - DOSED IN MG/24 HR) 0.2 mg/hr, Place 1 patch onto the skin daily., Disp: , Rfl: ;  omega-3 acid ethyl esters (LOVAZA) 1 G capsule, Take 2 g by mouth 2 (two) times daily., Disp: , Rfl: ;  polyethylene glycol (MIRALAX / GLYCOLAX) packet, Take 17 g by mouth daily., Disp: , Rfl:  simvastatin (ZOCOR) 20 MG tablet, Take 20 mg by mouth every evening., Disp: , Rfl: ;  Travoprost, BAK Free, (TRAVATAN) 0.004 % SOLN ophthalmic solution, Place 1 drop into both eyes at bedtime., Disp: , Rfl: ;  HYDROcodone-acetaminophen (NORCO) 5-325 MG per tablet, Take 1 tablet by mouth 4 (four) times daily., Disp: 30 tablet, Rfl: 0;  HYDROcodone-acetaminophen (VICODIN) 5-500 MG per tablet, Take 1 tablet by mouth 4 (four) times daily., Disp: , Rfl:  permethrin (ELIMITE) 5 % cream, Apply 1 application topically once. Apply to body on 06/10/12, then repeat 06/17/12, Disp: , Rfl:  Past Surgical History  Procedure Laterality Date  . Leg amputation below knee      left   No recent procedures performed.  Review of Systems  Constitutional: Negative for appetite change.   Respiratory: Negative for cough.   Cardiovascular: Negative for chest pain and leg swelling.  Gastrointestinal: Negative for abdominal pain and constipation.  Musculoskeletal: Negative for myalgias and back pain.  Neurological: Negative for headaches.  Psychiatric/Behavioral: Negative.        Filed Vitals:   01/26/13 1459  BP: 124/76  Pulse: 74  Height: 5\' 9"  (1.753 m)  Weight: 169 lb (76.658 kg)   Objective:   Physical Exam  Constitutional: He appears well-developed and well-nourished.  Neck: Neck supple.  Cardiovascular: Normal rate, regular rhythm and intact distal pulses.   Pulmonary/Chest: Effort normal and breath sounds normal.  Abdominal: Soft. Bowel sounds are normal.  Musculoskeletal:  Left bka; able to move all extremities  Neurological: He is alert.  Alert to self only  Skin: Skin is warm and dry.  Psychiatric: He has a normal mood and affect. His behavior is normal.   Lab reviewed: 01-01-13: wbc 4.1;hgb 8.9; hct 28.2; mcv 82.7; plt 213; glucose 81; bun 28; creat 1.70; k+ 4.3 Na++ 138; t. Bili0.3; alk phos 32; ast 13; alt <8; albumin 2.9; chol 95; ldl 49; trig 67     Assessment:     Cad; dementia; hyperlipidemia; hypernatemia; glaucoma    Plan:        Will stop his lovaza; tricor and zocor will not make any other medication changes at this time; will continue the rest of his plan of care and will continue to monitor his status

## 2013-03-23 ENCOUNTER — Non-Acute Institutional Stay (SKILLED_NURSING_FACILITY): Payer: Medicare Other | Admitting: Adult Health

## 2013-03-23 ENCOUNTER — Encounter: Payer: Self-pay | Admitting: Adult Health

## 2013-03-23 DIAGNOSIS — F0390 Unspecified dementia without behavioral disturbance: Secondary | ICD-10-CM

## 2013-03-23 DIAGNOSIS — H409 Unspecified glaucoma: Secondary | ICD-10-CM

## 2013-03-23 DIAGNOSIS — I10 Essential (primary) hypertension: Secondary | ICD-10-CM

## 2013-03-23 DIAGNOSIS — G894 Chronic pain syndrome: Secondary | ICD-10-CM

## 2013-03-23 DIAGNOSIS — M810 Age-related osteoporosis without current pathological fracture: Secondary | ICD-10-CM

## 2013-03-23 DIAGNOSIS — K5900 Constipation, unspecified: Secondary | ICD-10-CM

## 2013-03-23 DIAGNOSIS — I2510 Atherosclerotic heart disease of native coronary artery without angina pectoris: Secondary | ICD-10-CM

## 2013-03-23 DIAGNOSIS — E870 Hyperosmolality and hypernatremia: Secondary | ICD-10-CM

## 2013-03-23 NOTE — Assessment & Plan Note (Signed)
Will change the namenda to namenda xr 28 mg daily and will continue aricept 10 mg daily

## 2013-03-23 NOTE — Progress Notes (Signed)
Patient ID: Joseph Ponce, male   DOB: 12/04/1922, 79 y.o.   MRN: [id number]  FACILITY: [facility]  No Known Allergies   Chief Complaint  Patient presents with  . Medical Managment of Chronic Issues    HPI:  He is being seen today for the management of his chronic illnesses; there are no reports of any change in his status; he not voicing any complaints.  He will need his namenda changed to the xr formulation.   Past Medical History  Diagnosis Date  . Dementia   . Muscle weakness   . Osteoporosis   . Alzheimer disease   . Failure to thrive in adult   . Dementia   . CVA (cerebral vascular accident)     speech and language d/o deficits  . Weakness   . Hyperlipemia   . Dysphagia   . Anemia   . PVD (peripheral vascular disease)   . COPD (chronic obstructive pulmonary disease)   . Glaucoma (increased eye pressure)   . Hypertension   . S/P BKA (below knee amputation) unilateral 01/21/2006    left  . Renal artery stenosis     Past Surgical History  Procedure Laterality Date  . Leg amputation below knee      left    VITAL SIGNS BP 132/70  Pulse 70  Ht 5\' 9"  (1.753 m)  Wt 176 lb (79.833 kg)  BMI 25.98 kg/m2   Patient's Medications  New Prescriptions   No medications on file  Previous Medications   AMLODIPINE (NORVASC) 10 MG TABLET    Take 10 mg by mouth daily.   ASPIRIN 81 MG CHEWABLE TABLET    Chew 81 mg by mouth daily.   BRIMONIDINE (ALPHAGAN P) 0.1 % SOLN    Place 1 drop into both eyes 2 (two) times daily.   CALCIUM-VITAMIN D (OSCAL WITH D) 500-200 MG-UNIT PER TABLET    Take 1 tablet by mouth 2 (two) times daily.   CLOPIDOGREL (PLAVIX) 75 MG TABLET    Take 75 mg by mouth daily.   DENOSUMAB (PROLIA) 60 MG/ML SOLN INJECTION    Inject 60 mg into the skin every 6 (six) months. Next dose due April 2014   DESMOPRESSIN (DDAVP) 0.01 % SOLN    Place 1 spray into the nose 3 (three) times daily.   DONEPEZIL (ARICEPT) 10 MG TABLET    Take 10 mg by mouth at bedtime as needed.    DORZOLAMIDE (TRUSOPT) 2 % OPHTHALMIC SOLUTION    Place 1 drop into both eyes 2 (two) times daily.   GABAPENTIN (NEURONTIN) 100 MG CAPSULE    Take 100 mg by mouth 2 (two) times daily.   HYDROCODONE-ACETAMINOPHEN (NORCO) 5-325 MG PER TABLET    Take 1 tablet by mouth 4 (four) times daily.   HYDROXYPROPYL METHYLCELLULOSE (ISOPTO TEARS) 2.5 % OPHTHALMIC SOLUTION    Place 1 drop into both eyes 3 (three) times daily.   MAGNESIUM HYDROXIDE (MILK OF MAGNESIA) 400 MG/5ML SUSPENSION    Take 30 mLs by mouth daily as needed. For constipation   MEMANTINE (NAMENDA) 10 MG TABLET    Take 10 mg by mouth 2 (two) times daily.   MULTIPLE VITAMIN (MULTIVITAMIN WITH MINERALS) TABS    Take 1 tablet by mouth daily.   NITROGLYCERIN (NITRODUR - DOSED IN MG/24 HR) 0.2 MG/HR    Place 1 patch onto the skin daily.   POLYETHYLENE GLYCOL (MIRALAX / GLYCOLAX) PACKET    Take 17 g by mouth daily.   TRAVOPROST, BAK  FREE, (TRAVATAN) 0.004 % SOLN OPHTHALMIC SOLUTION    Place 1 drop into both eyes at bedtime.  Modified Medications   No medications on file  Discontinued Medications   HYDROCODONE-ACETAMINOPHEN (VICODIN) 5-500 MG PER TABLET    Take 1 tablet by mouth 4 (four) times daily.    SIGNIFICANT DIAGNOSTIC EXAMS  None recent  Lab reviewed: 01-01-13: wbc 4.1;hgb 8.9; hct 28.2; mcv 82.7; plt 213; glucose 81; bun 28; creat 1.70; k+ 4.3 Na++ 138; t. Bili0.3; alk phos 32; ast 13; alt <8; albumin 2.9; chol 95; ldl 49; trig 67    Review of Systems  Constitutional: Negative for appetite change.  Respiratory: Negative for cough.   Cardiovascular: Negative for chest pain and leg swelling.  Gastrointestinal: Negative for abdominal pain and constipation.  Musculoskeletal: Negative for myalgias and back pain.  Neurological: Negative for headaches.  Psychiatric/Behavioral: Negative.       Physical Exam  Constitutional: He appears well-developed and well-nourished.  Neck: Neck supple.  Cardiovascular: Normal rate, regular  rhythm and intact distal pulses.   Pulmonary/Chest: Effort normal and breath sounds normal.  Abdominal: Soft. Bowel sounds are normal.  Musculoskeletal:  Left bka; able to move all extremities  Neurological: He is alert.  Alert to self only  Skin: Skin is warm and dry.  Psychiatric: He has a normal mood and affect. His behavior is normal.   .        ASSESSMENT/ PLAN:  Osteoporosis, unspecified Will continue the prolia every 6 months and ca++ with d twice daily   Unspecified constipation Will continue miralax daily   Chronic pain syndrome Will continue vicodin 5/325 mg four times daily and neurontin 100 mg twice daily   Dementia Will change the namenda to namenda xr 28 mg daily and will continue aricept 10 mg daily   CAD (coronary artery disease) Will continue asa 81 mg daily plavix daily and ntg patch 0.2 mg daily and will monitor his status  Glaucoma (increased eye pressure) Stable will contoinue alphagan to both eyes twice daily ; trusopt to both eyes twice daily; isopto to both eyes three times daily; travatan z to both eyes nightly and liquids tears to both eyes four times daily  Hypernatremia Is stable will continue ddavp nasally three times daily  Hypertension Stable will continue norvasc 10 mg daily

## 2013-03-23 NOTE — Assessment & Plan Note (Signed)
Stable will continue norvasc 10 mg daily

## 2013-03-23 NOTE — Assessment & Plan Note (Signed)
Will continue miralax daily  

## 2013-03-23 NOTE — Assessment & Plan Note (Addendum)
Will continue asa 81 mg daily plavix daily and ntg patch 0.2 mg daily and will monitor his status

## 2013-03-23 NOTE — Assessment & Plan Note (Signed)
Will continue vicodin 5/325 mg four times daily and neurontin 100 mg twice daily

## 2013-03-23 NOTE — Assessment & Plan Note (Signed)
Is stable will continue ddavp nasally three times daily

## 2013-03-23 NOTE — Assessment & Plan Note (Signed)
Will continue the prolia every 6 months and ca++ with d twice daily

## 2013-03-23 NOTE — Assessment & Plan Note (Signed)
Stable will contoinue alphagan to both eyes twice daily ; trusopt to both eyes twice daily; isopto to both eyes three times daily; travatan z to both eyes nightly and liquids tears to both eyes four times daily

## 2013-04-27 ENCOUNTER — Encounter: Payer: Self-pay | Admitting: Adult Health

## 2013-04-27 ENCOUNTER — Non-Acute Institutional Stay (SKILLED_NURSING_FACILITY): Payer: Medicare Other | Admitting: Adult Health

## 2013-04-27 DIAGNOSIS — K5900 Constipation, unspecified: Secondary | ICD-10-CM

## 2013-04-27 DIAGNOSIS — F0390 Unspecified dementia without behavioral disturbance: Secondary | ICD-10-CM

## 2013-04-27 DIAGNOSIS — I10 Essential (primary) hypertension: Secondary | ICD-10-CM

## 2013-04-27 DIAGNOSIS — I2510 Atherosclerotic heart disease of native coronary artery without angina pectoris: Secondary | ICD-10-CM

## 2013-04-27 DIAGNOSIS — G894 Chronic pain syndrome: Secondary | ICD-10-CM

## 2013-04-27 DIAGNOSIS — M810 Age-related osteoporosis without current pathological fracture: Secondary | ICD-10-CM

## 2013-04-27 DIAGNOSIS — H409 Unspecified glaucoma: Secondary | ICD-10-CM

## 2013-04-27 DIAGNOSIS — E870 Hyperosmolality and hypernatremia: Secondary | ICD-10-CM

## 2013-05-18 ENCOUNTER — Encounter: Payer: Self-pay | Admitting: Adult Health

## 2013-05-18 ENCOUNTER — Non-Acute Institutional Stay (SKILLED_NURSING_FACILITY): Payer: Medicare Other | Admitting: Adult Health

## 2013-05-18 DIAGNOSIS — G4700 Insomnia, unspecified: Secondary | ICD-10-CM

## 2013-05-18 MED ORDER — TRAZODONE HCL 50 MG PO TABS: 50.0000 mg | ORAL_TABLET | Freq: Every day | ORAL | Status: DC

## 2013-05-18 MED ORDER — MEMANTINE HCL ER 28 MG PO CP24: 28.0000 mg | ORAL_CAPSULE | Freq: Every day | ORAL | Status: DC

## 2013-05-18 NOTE — Assessment & Plan Note (Signed)
Is stable will conitnue vicodin 5/325 4 times daily and neurontin 100 mg 2 times daily

## 2013-05-18 NOTE — Assessment & Plan Note (Signed)
His insomnia is worse; will increase his trazodone to 50 mg nightly and will monitor his status

## 2013-05-18 NOTE — Assessment & Plan Note (Signed)
Will continue aricept 10 mg daily and namenda xr 28 mg daily status is without change

## 2013-05-18 NOTE — Assessment & Plan Note (Signed)
Stable will continue miralax daily

## 2013-05-18 NOTE — Assessment & Plan Note (Signed)
Will continue prolia 60 mg every 6 months due in Oct; and Ca++ with d 2 times daily

## 2013-05-18 NOTE — Assessment & Plan Note (Addendum)
Will continue ntg patch 0.2 mg daily and asa 81 mg daily ; plavix 75 mg daily and will monitor status

## 2013-05-18 NOTE — Assessment & Plan Note (Signed)
Will continue ddavp nasally three times daily

## 2013-05-18 NOTE — Assessment & Plan Note (Signed)
Will continue alphagan to both eyes twice daily; trupsopt to both eyes twice daily; isopto to both eyes three times daily; travatan z to both eyes nightly and liquid tears to both eyes 4 times daily

## 2013-05-18 NOTE — Progress Notes (Signed)
Patient ID: Joseph Ponce, male   DOB: Aug 04, 1924, 79 y.o.   MRN: [id number]  [facility]  No Known Allergies    Chief Complaint  Patient presents with  . Acute Visit    insomnia    HPI:  He is complaining of insomnia states he is unable to sleep at night for the past several nights there is no complaints of pain present; he is not complaining of any depression or anxiety present.   Past Medical History  Diagnosis Date  . Dementia   . Muscle weakness   . Osteoporosis   . Alzheimer disease   . Failure to thrive in adult   . Dementia   . CVA (cerebral vascular accident)     speech and language d/o deficits  . Weakness   . Hyperlipemia   . Dysphagia   . Anemia   . PVD (peripheral vascular disease)   . COPD (chronic obstructive pulmonary disease)   . Glaucoma (increased eye pressure)   . Hypertension   . S/P BKA (below knee amputation) unilateral 01/21/2006    left  . Renal artery stenosis     Past Surgical History  Procedure Laterality Date  . Leg amputation below knee      left    VITAL SIGNS BP 140/70  Pulse 88  Ht 5\' 9"  (1.753 m)  Wt 178 lb (80.74 kg)  BMI 26.27 kg/m2   Patient's Medications  New Prescriptions   No medications on file  Previous Medications   AMLODIPINE (NORVASC) 10 MG TABLET    Take 10 mg by mouth daily.   ASPIRIN 81 MG CHEWABLE TABLET    Chew 81 mg by mouth daily.   BRIMONIDINE (ALPHAGAN P) 0.1 % SOLN    Place 1 drop into both eyes 2 (two) times daily.   CALCIUM-VITAMIN D (OSCAL WITH D) 500-200 MG-UNIT PER TABLET    Take 1 tablet by mouth 2 (two) times daily.   CLOPIDOGREL (PLAVIX) 75 MG TABLET    Take 75 mg by mouth daily.   DENOSUMAB (PROLIA) 60 MG/ML SOLN INJECTION    Inject 60 mg into the skin every 6 (six) months. Next dose due April 2014   DESMOPRESSIN (DDAVP) 0.01 % SOLN    Place 1 spray into the nose 3 (three) times daily.   DONEPEZIL (ARICEPT) 10 MG TABLET    Take 10 mg by mouth at bedtime as needed.   DORZOLAMIDE (TRUSOPT) 2 %  OPHTHALMIC SOLUTION    Place 1 drop into both eyes 2 (two) times daily.   GABAPENTIN (NEURONTIN) 100 MG CAPSULE    Take 100 mg by mouth 2 (two) times daily.   HYDROCODONE-ACETAMINOPHEN (NORCO) 5-325 MG PER TABLET    Take 1 tablet by mouth 4 (four) times daily.   HYDROXYPROPYL METHYLCELLULOSE (ISOPTO TEARS) 2.5 % OPHTHALMIC SOLUTION    Place 1 drop into both eyes 3 (three) times daily.   MAGNESIUM HYDROXIDE (MILK OF MAGNESIA) 400 MG/5ML SUSPENSION    Take 30 mLs by mouth daily as needed. For constipation   MEMANTINE HCL ER (NAMENDA XR) 28 MG CP24    Take 28 mg by mouth daily.   MULTIPLE VITAMIN (MULTIVITAMIN WITH MINERALS) TABS    Take 1 tablet by mouth daily.   NITROGLYCERIN (NITRODUR - DOSED IN MG/24 HR) 0.2 MG/HR    Place 1 patch onto the skin daily.   POLYETHYLENE GLYCOL (MIRALAX / GLYCOLAX) PACKET    Take 17 g by mouth daily.   TRAVOPROST, BAK FREE, (TRAVATAN)  0.004 % SOLN OPHTHALMIC SOLUTION    Place 1 drop into both eyes at bedtime.   TRAZODONE (DESYREL) 50 MG TABLET    Take 25 mg by mouth at bedtime.  Modified Medications   No medications on file  Discontinued Medications   No medications on file    SIGNIFICANT DIAGNOSTIC EXAMS   None recent  Lab reviewed: 01-01-13: wbc 4.1;hgb 8.9; hct 28.2; mcv 82.7; plt 213; glucose 81; bun 28; creat 1.70; k+ 4.3 Na++ 138; t. Bili0.3; alk phos 32; ast 13; alt <8; albumin 2.9; chol 95; ldl 49; trig 67    Review of Systems  Constitutional: Negative for appetite change.  Respiratory: Negative for cough.   Cardiovascular: Negative for chest pain and leg swelling.  Gastrointestinal: Negative for abdominal pain and constipation.  Musculoskeletal: Negative for myalgias and back pain.  Neurological: Negative for headaches.  Psychiatric/Behavioral: HAS INSOMNIA        Physical Exam  Constitutional: He appears well-developed and well-nourished.  Neck: Neck supple.  Cardiovascular: Normal rate, regular rhythm and intact distal pulses.    Pulmonary/Chest: Effort normal and breath sounds normal.  Abdominal: Soft. Bowel sounds are normal.  Musculoskeletal:  Left bka; able to move all extremities  Neurological: He is alert.  Alert to self only  Skin: Skin is warm and dry.  Psychiatric: He has a normal mood and affect. His behavior is normal.     ASSESSMENT/ PLAN:  Insomnia His insomnia is worse; will increase his trazodone to 50 mg nightly and will monitor his status

## 2013-05-18 NOTE — Progress Notes (Signed)
Patient ID: Joseph Ponce, male   DOB: 03/30/1924, 79 y.o.   MRN: [id number]  [facility]  No Known Allergies   Chief Complaint  Patient presents with  . Medical Managment of Chronic Issues    HPI: He is being for the management of his chronic illnesses; there are no concerns being voiced by the nursing of the patient; overall there is no change in his status.    Past Medical History  Diagnosis Date  . Dementia   . Muscle weakness   . Osteoporosis   . Alzheimer disease   . Failure to thrive in adult   . Dementia   . CVA (cerebral vascular accident)     speech and language d/o deficits  . Weakness   . Hyperlipemia   . Dysphagia   . Anemia   . PVD (peripheral vascular disease)   . COPD (chronic obstructive pulmonary disease)   . Glaucoma (increased eye pressure)   . Hypertension   . S/P BKA (below knee amputation) unilateral 01/21/2006    left  . Renal artery stenosis     Past Surgical History  Procedure Laterality Date  . Leg amputation below knee      left    VITAL SIGNS BP 132/70  Pulse 70  Ht 5\' 9"  (1.753 m)  Wt 176 lb (79.833 kg)  BMI 25.98 kg/m2   Patient's Medications  New Prescriptions   No medications on file  Previous Medications   AMLODIPINE (NORVASC) 10 MG TABLET    Take 10 mg by mouth daily.   ASPIRIN 81 MG CHEWABLE TABLET    Chew 81 mg by mouth daily.   BRIMONIDINE (ALPHAGAN P) 0.1 % SOLN    Place 1 drop into both eyes 2 (two) times daily.   CALCIUM-VITAMIN D (OSCAL WITH D) 500-200 MG-UNIT PER TABLET    Take 1 tablet by mouth 2 (two) times daily.   CLOPIDOGREL (PLAVIX) 75 MG TABLET    Take 75 mg by mouth daily.   DENOSUMAB (PROLIA) 60 MG/ML SOLN INJECTION    Inject 60 mg into the skin every 6 (six) months. Next dose due April 2014   DESMOPRESSIN (DDAVP) 0.01 % SOLN    Place 1 spray into the nose 3 (three) times daily.   DONEPEZIL (ARICEPT) 10 MG TABLET    Take 10 mg by mouth at bedtime as needed.   DORZOLAMIDE (TRUSOPT) 2 % OPHTHALMIC SOLUTION     Place 1 drop into both eyes 2 (two) times daily.   GABAPENTIN (NEURONTIN) 100 MG CAPSULE    Take 100 mg by mouth 2 (two) times daily.   HYDROCODONE-ACETAMINOPHEN (NORCO) 5-325 MG PER TABLET    Take 1 tablet by mouth 4 (four) times daily.   HYDROXYPROPYL METHYLCELLULOSE (ISOPTO TEARS) 2.5 % OPHTHALMIC SOLUTION    Place 1 drop into both eyes 3 (three) times daily.   MAGNESIUM HYDROXIDE (MILK OF MAGNESIA) 400 MG/5ML SUSPENSION    Take 30 mLs by mouth daily as needed. For constipation   MEMANTINE (NAMENDA) 10 MG TABLET    Take 10 mg by mouth 2 (two) times daily.   MULTIPLE VITAMIN (MULTIVITAMIN WITH MINERALS) TABS    Take 1 tablet by mouth daily.   NITROGLYCERIN (NITRODUR - DOSED IN MG/24 HR) 0.2 MG/HR    Place 1 patch onto the skin daily.   POLYETHYLENE GLYCOL (MIRALAX / GLYCOLAX) PACKET    Take 17 g by mouth daily.   TRAVOPROST, BAK FREE, (TRAVATAN) 0.004 % SOLN OPHTHALMIC SOLUTION  Place 1 drop into both eyes at bedtime.  Modified Medications   No medications on file  Discontinued Medications   No medications on file    SIGNIFICANT DIAGNOSTIC EXAMS   None recent  Lab reviewed: 01-01-13: wbc 4.1;hgb 8.9; hct 28.2; mcv 82.7; plt 213; glucose 81; bun 28; creat 1.70; k+ 4.3 Na++ 138; t. Bili0.3; alk phos 32; ast 13; alt <8; albumin 2.9; chol 95; ldl 49; trig 67    Review of Systems  Constitutional: Negative for appetite change.  Respiratory: Negative for cough.   Cardiovascular: Negative for chest pain and leg swelling.  Gastrointestinal: Negative for abdominal pain and constipation.  Musculoskeletal: Negative for myalgias and back pain.  Neurological: Negative for headaches.  Psychiatric/Behavioral: Negative.       Physical Exam  Constitutional: He appears well-developed and well-nourished.  Neck: Neck supple.  Cardiovascular: Normal rate, regular rhythm and intact distal pulses.   Pulmonary/Chest: Effort normal and breath sounds normal.  Abdominal: Soft. Bowel sounds are  normal.  Musculoskeletal:  Left bka; able to move all extremities  Neurological: He is alert.  Alert to self only  Skin: Skin is warm and dry.  Psychiatric: He has a normal mood and affect. His behavior is normal.     ASSESSMENT/ PLAN:  Chronic pain syndrome Is stable will conitnue vicodin 5/325 4 times daily and neurontin 100 mg 2 times daily   Osteoporosis, unspecified Will continue prolia 60 mg every 6 months due in Oct; and Ca++ with d 2 times daily   Unspecified constipation Stable will continue miralax daily   Dementia Will continue aricept 10 mg daily and namenda xr 28 mg daily status is without change   CAD (coronary artery disease) Will continue ntg patch 0.2 mg daily and asa 81 mg daily ; plavix 75 mg daily and will monitor status   Hypertension Will continue norvasc 10 mg daily   Glaucoma (increased eye pressure) Will continue alphagan to both eyes twice daily; trupsopt to both eyes twice daily; isopto to both eyes three times daily; travatan z to both eyes nightly and liquid tears to both eyes 4 times daily   Hypernatremia Will continue ddavp nasally three times daily

## 2013-05-18 NOTE — Progress Notes (Signed)
This encounter was created in error - please disregard.

## 2013-05-18 NOTE — Assessment & Plan Note (Signed)
Will continue norvasc 10 mg daily

## 2013-07-30 ENCOUNTER — Other Ambulatory Visit: Payer: Self-pay | Admitting: *Deleted

## 2013-07-30 DIAGNOSIS — G894 Chronic pain syndrome: Secondary | ICD-10-CM

## 2013-07-30 MED ORDER — HYDROCODONE-ACETAMINOPHEN 5-325 MG PO TABS: ORAL_TABLET | ORAL | Status: DC

## 2013-08-03 ENCOUNTER — Non-Acute Institutional Stay (SKILLED_NURSING_FACILITY): Payer: Medicare Other | Admitting: Adult Health

## 2013-08-03 DIAGNOSIS — H409 Unspecified glaucoma: Secondary | ICD-10-CM

## 2013-08-03 DIAGNOSIS — G4700 Insomnia, unspecified: Secondary | ICD-10-CM

## 2013-08-03 DIAGNOSIS — I10 Essential (primary) hypertension: Secondary | ICD-10-CM

## 2013-08-03 DIAGNOSIS — E870 Hyperosmolality and hypernatremia: Secondary | ICD-10-CM

## 2013-08-03 DIAGNOSIS — G894 Chronic pain syndrome: Secondary | ICD-10-CM

## 2013-08-03 DIAGNOSIS — F0390 Unspecified dementia without behavioral disturbance: Secondary | ICD-10-CM

## 2013-08-03 DIAGNOSIS — M810 Age-related osteoporosis without current pathological fracture: Secondary | ICD-10-CM

## 2013-08-03 DIAGNOSIS — I2510 Atherosclerotic heart disease of native coronary artery without angina pectoris: Secondary | ICD-10-CM

## 2013-08-28 ENCOUNTER — Other Ambulatory Visit: Payer: Self-pay | Admitting: *Deleted

## 2013-08-28 DIAGNOSIS — G894 Chronic pain syndrome: Secondary | ICD-10-CM

## 2013-08-28 MED ORDER — HYDROCODONE-ACETAMINOPHEN 5-325 MG PO TABS: ORAL_TABLET | ORAL | Status: DC

## 2013-08-31 ENCOUNTER — Non-Acute Institutional Stay (SKILLED_NURSING_FACILITY): Payer: Medicare Other | Admitting: Nurse Practitioner

## 2013-08-31 DIAGNOSIS — N189 Chronic kidney disease, unspecified: Secondary | ICD-10-CM

## 2013-08-31 DIAGNOSIS — F0390 Unspecified dementia without behavioral disturbance: Secondary | ICD-10-CM

## 2013-08-31 DIAGNOSIS — D649 Anemia, unspecified: Secondary | ICD-10-CM

## 2013-08-31 DIAGNOSIS — I10 Essential (primary) hypertension: Secondary | ICD-10-CM

## 2013-08-31 DIAGNOSIS — G4700 Insomnia, unspecified: Secondary | ICD-10-CM

## 2013-08-31 NOTE — Progress Notes (Signed)
Patient ID: Joseph Ponce, male   DOB: 1924-10-29, 79 y.o.   MRN: [id number]   PCP: [name], MD   No Known Allergies  Chief Complaint  Patient presents with  . Medical Managment of Chronic Issues    HPI:  79 year old male who is a LTR of [place] with a pmh of dementia, OP, FTT, CVA, anemia, hyperlipidemia, PVD and htn. Pt is being seen today for routine follow up. Pt is without complaints during visit and nursing does not have concerns   Review of Systems:  Review of Systems  Constitutional: Negative for fever and chills.  Eyes: Negative.   Respiratory: Negative for shortness of breath.   Cardiovascular: Negative for chest pain.  Gastrointestinal: Negative for heartburn, abdominal pain, diarrhea and constipation.  Genitourinary: Negative for dysuria, urgency and frequency.  Skin: Negative.   Neurological: Negative for dizziness and headaches.  Psychiatric/Behavioral: Positive for memory loss. Negative for depression. The patient is not nervous/anxious and does not have insomnia.      Past Medical History  Diagnosis Date  . Dementia   . Muscle weakness   . Osteoporosis   . Alzheimer disease   . Failure to thrive in adult   . Dementia   . CVA (cerebral vascular accident)     speech and language d/o deficits  . Weakness   . Hyperlipemia   . Dysphagia   . Anemia   . PVD (peripheral vascular disease)   . COPD (chronic obstructive pulmonary disease)   . Glaucoma (increased eye pressure)   . Hypertension   . S/P BKA (below knee amputation) unilateral 01/21/2006    left  . Renal artery stenosis    Past Surgical History  Procedure Laterality Date  . Leg amputation below knee      left   Social History:   reports that he has quit smoking. He does not have any smokeless tobacco history on file. He reports that he does not drink alcohol or use illicit drugs.  No family history on file.  Medications: Patient's Medications  New Prescriptions   No  medications on file  Previous Medications   AMLODIPINE (NORVASC) 10 MG TABLET    Take 10 mg by mouth daily.   ASPIRIN 81 MG CHEWABLE TABLET    Chew 81 mg by mouth daily.   BRIMONIDINE (ALPHAGAN P) 0.1 % SOLN    Place 1 drop into both eyes 2 (two) times daily.   CALCIUM-VITAMIN D (OSCAL WITH D) 500-200 MG-UNIT PER TABLET    Take 1 tablet by mouth 2 (two) times daily.   CLOPIDOGREL (PLAVIX) 75 MG TABLET    Take 75 mg by mouth daily.   DENOSUMAB (PROLIA) 60 MG/ML SOLN INJECTION    Inject 60 mg into the skin every 6 (six) months. Next dose due April 2014   DESMOPRESSIN (DDAVP) 0.01 % SOLN    Place 1 spray into the nose 3 (three) times daily.   DONEPEZIL (ARICEPT) 10 MG TABLET    Take 10 mg by mouth at bedtime as needed.   DORZOLAMIDE (TRUSOPT) 2 % OPHTHALMIC SOLUTION    Place 1 drop into both eyes 2 (two) times daily.   GABAPENTIN (NEURONTIN) 100 MG CAPSULE    Take 100 mg by mouth 2 (two) times daily.   HYDROCODONE-ACETAMINOPHEN (NORCO) 5-325 MG PER TABLET    Take one tablet by mouth four times daily for pain   HYDROXYPROPYL METHYLCELLULOSE (ISOPTO TEARS) 2.5 % OPHTHALMIC SOLUTION    Place 1 drop  into both eyes 3 (three) times daily.   MAGNESIUM HYDROXIDE (MILK OF MAGNESIA) 400 MG/5ML SUSPENSION    Take 30 mLs by mouth daily as needed. For constipation   MEMANTINE HCL ER (NAMENDA XR) 28 MG CP24    Take 28 mg by mouth daily.   MULTIPLE VITAMIN (MULTIVITAMIN WITH MINERALS) TABS    Take 1 tablet by mouth daily.   NITROGLYCERIN (NITRODUR - DOSED IN MG/24 HR) 0.2 MG/HR    Place 1 patch onto the skin daily.   POLYETHYLENE GLYCOL (MIRALAX / GLYCOLAX) PACKET    Take 17 g by mouth daily.   TRAVOPROST, BAK FREE, (TRAVATAN) 0.004 % SOLN OPHTHALMIC SOLUTION    Place 1 drop into both eyes at bedtime.  Modified Medications   Modified Medication Previous Medication   TRAZODONE (DESYREL) 50 MG TABLET traZODone (DESYREL) 50 MG tablet      Take 25 mg by mouth at bedtime.    Take 1 tablet (50 mg total) by mouth at  bedtime.  Discontinued Medications   No medications on file     Physical Exam:  Filed Vitals:   08/31/13 1111  BP: 128/70  Pulse: 82  Temp: 96.4 F (35.8 C)  Resp: 18  Weight: 170 lb (77.111 kg)   Physical Exam  Vitals reviewed. Constitutional: He is well-developed, well-nourished, and in no distress. No distress.  Eyes: Conjunctivae and EOM are normal. Pupils are equal, round, and reactive to light.  Neck: Normal range of motion. Neck supple.  Cardiovascular: Normal rate, regular rhythm and normal heart sounds.   Pulmonary/Chest: Effort normal and breath sounds normal. No respiratory distress.  Abdominal: Soft. Bowel sounds are normal. He exhibits no distension.  Musculoskeletal: He exhibits no edema and no tenderness.  Left BKA; self propels in [facility]  Neurological: He is alert.  Skin: Skin is warm and dry. He is not diaphoretic.      Labs reviewed: 01-01-13: wbc 4.1;hgb 8.9; hct 28.2; mcv 82.7; plt 213; glucose 81; bun 28; creat 1.70; k+ 4.3  Na++ 138; t. Bili0.3; alk phos 32; ast 13; alt <8; albumin 2.9; chol 95; ldl 49; trig 67  08/01/13 Sodium 141, potassium, 4.0, glucose 91, BUN 31, Cr 1.68, ALT 9, AST 19 Albumin 3.5 hgb a1c 5.9,   Assessment/Plan 1. Hypertension Patient is stable; continue current regimen. Will monitor and make changes as necessary.  2. Dementia Stable in current setting; to cont aricept and namanda   3. Anemia Will follow up cbc   4. Insomnia Staff reports pt has ongoing complaints of insomnia but night staff reports he is sleeping  5. Chronic renal disease Patient is stable; continue current regimen. Will monitor and make changes as necessary.

## 2013-09-03 NOTE — Progress Notes (Signed)
Patient ID: Joseph Ponce, male   DOB: [birth year], 79 y.o.   MRN: [id number]  [place]  No Known Allergies   Chief Complaint  Patient presents with  . Medical Managment of Chronic Issues    HPI:  No problem-specific assessment & plan notes found for this encounter.   Past Medical History  Diagnosis Date  . Dementia   . Muscle weakness   . Osteoporosis   . Alzheimer disease   . Failure to thrive in adult   . Dementia   . CVA (cerebral vascular accident)     speech and language d/o deficits  . Weakness   . Hyperlipemia   . Dysphagia   . Anemia   . PVD (peripheral vascular disease)   . COPD (chronic obstructive pulmonary disease)   . Glaucoma (increased eye pressure)   . Hypertension   . S/P BKA (below knee amputation) unilateral 01/21/2006    left  . Renal artery stenosis     Past Surgical History  Procedure Laterality Date  . Leg amputation below knee      left    VITAL SIGNS BP 128/70  Pulse 82  Wt 177 lb (80.287 kg)  BMI 26.13 kg/m2   Patient's Medications  New Prescriptions   No medications on file  Previous Medications   AMLODIPINE (NORVASC) 10 MG TABLET    Take 10 mg by mouth daily.   ASPIRIN 81 MG CHEWABLE TABLET    Chew 81 mg by mouth daily.   BRIMONIDINE (ALPHAGAN P) 0.1 % SOLN    Place 1 drop into both eyes 2 (two) times daily.   CALCIUM-VITAMIN D (OSCAL WITH D) 500-200 MG-UNIT PER TABLET    Take 1 tablet by mouth 2 (two) times daily.   CLOPIDOGREL (PLAVIX) 75 MG TABLET    Take 75 mg by mouth daily.   DENOSUMAB (PROLIA) 60 MG/ML SOLN INJECTION    Inject 60 mg into the skin every 6 (six) months. Next dose due April 2014   DESMOPRESSIN (DDAVP) 0.01 % SOLN    Place 1 spray into the nose 3 (three) times daily.   DONEPEZIL (ARICEPT) 10 MG TABLET    Take 10 mg by mouth at bedtime as needed.   DORZOLAMIDE (TRUSOPT) 2 % OPHTHALMIC SOLUTION    Place 1 drop into both eyes 2 (two) times daily.   GABAPENTIN (NEURONTIN) 100 MG CAPSULE    Take 100 mg by mouth  2 (two) times daily.   HYDROCODONE-ACETAMINOPHEN (NORCO) 5-325 MG PER TABLET    Take one tablet by mouth four times daily for pain   HYDROXYPROPYL METHYLCELLULOSE (ISOPTO TEARS) 2.5 % OPHTHALMIC SOLUTION    Place 1 drop into both eyes 3 (three) times daily.   MAGNESIUM HYDROXIDE (MILK OF MAGNESIA) 400 MG/5ML SUSPENSION    Take 30 mLs by mouth daily as needed. For constipation   MEMANTINE HCL ER (NAMENDA XR) 28 MG CP24    Take 28 mg by mouth daily.   MULTIPLE VITAMIN (MULTIVITAMIN WITH MINERALS) TABS    Take 1 tablet by mouth daily.   NITROGLYCERIN (NITRODUR - DOSED IN MG/24 HR) 0.2 MG/HR    Place 1 patch onto the skin daily.   POLYETHYLENE GLYCOL (MIRALAX / GLYCOLAX) PACKET    Take 17 g by mouth daily.   TRAVOPROST, BAK FREE, (TRAVATAN) 0.004 % SOLN OPHTHALMIC SOLUTION    Place 1 drop into both eyes at bedtime.   TRAZODONE (DESYREL) 50 MG TABLET    Take 25 mg by mouth at  bedtime.  Modified Medications   No medications on file  Discontinued Medications   No medications on file    SIGNIFICANT DIAGNOSTIC EXAMS   None recent  Lab reviewed: 01-01-13: wbc 4.1;hgb 8.9; hct 28.2; mcv 82.7; plt 213; glucose 81; bun 28; creat 1.70; k+ 4.3 Na++ 138; t. Bili0.3; alk phos 32; ast 13; alt <8; albumin 2.9; chol 95; ldl 49; trig 67  05-26-13: urine culture: e-coli: rocephin   Review of Systems  Constitutional: Negative for malaise/fatigue.  Respiratory: Negative for cough and shortness of breath.   Cardiovascular: Negative for chest pain and palpitations.  Gastrointestinal: Negative for heartburn and abdominal pain.  Musculoskeletal: Negative for back pain and myalgias.  Skin: Negative.   Neurological: Negative for headaches.  Psychiatric/Behavioral: The patient is not nervous/anxious and does not have insomnia.     Physical Exam  Constitutional: He appears well-developed and well-nourished.  Neck: Neck supple. No thyromegaly present.  Cardiovascular: Normal rate, regular rhythm and intact  distal pulses.   Respiratory: Effort normal and breath sounds normal. No respiratory distress. He has no wheezes.  GI: Soft. Bowel sounds are normal. He exhibits no distension. There is no tenderness.  Musculoskeletal: Normal range of motion. He exhibits no edema.  Left bka  Neurological: He is alert.  Skin: Skin is warm and dry.  Psychiatric: He has a normal mood and affect.      ASSESSMENT/ PLAN:  1. Hypertension: is stable will continue norvasc 10 mg daily;   2. Dementia: no significant change in status: will continue aricept 10 mg daily; namenda xr 28 mg daily  and will monitor his status   3. Hypernatremia: will continue DDAVP  Three times daily and will monitor   4. Osteoporosis: will continue prolia every 6 months and ca++ with d twice daily   5. Glaucoma: will continue trusopt both eyes twice daily; alphagan both eyes twice daily; travatan z both eyes nightly and isopt both eyes three times daily; liquid tears both eyes four times daily   6. Cad: stable no chest pain: will continue asa 81 mg daily plavix 75 mg daily; and ntg prn will monitor   7.chronic pain : is stable will continue his neurontin 100 mg twice daily and vicodin 5.325 mg four times daily for his chronic pain no pain present at this time   8. Constipation: will continue miralax 17 gm daily  9. Insomnia: will continue trazodone 50 mg nightly

## 2013-09-11 NOTE — Progress Notes (Signed)
Patient ID: Joseph Ponce, male   DOB: 25-Sep-1924, 79 y.o.   MRN: [id number] This encounter was created in error - please disregard.

## 2013-09-24 ENCOUNTER — Other Ambulatory Visit: Payer: Self-pay

## 2013-09-24 DIAGNOSIS — G894 Chronic pain syndrome: Secondary | ICD-10-CM

## 2013-09-24 MED ORDER — HYDROCODONE-ACETAMINOPHEN 5-325 MG PO TABS: ORAL_TABLET | ORAL | Status: DC

## 2013-10-29 ENCOUNTER — Other Ambulatory Visit: Payer: Self-pay | Admitting: *Deleted

## 2013-10-29 DIAGNOSIS — G894 Chronic pain syndrome: Secondary | ICD-10-CM

## 2013-10-29 MED ORDER — HYDROCODONE-ACETAMINOPHEN 5-325 MG PO TABS: ORAL_TABLET | ORAL | Status: DC

## 2013-11-05 ENCOUNTER — Non-Acute Institutional Stay (SKILLED_NURSING_FACILITY): Payer: Medicare Other | Admitting: Nurse Practitioner

## 2013-11-05 ENCOUNTER — Encounter: Payer: Self-pay | Admitting: Nurse Practitioner

## 2013-11-05 DIAGNOSIS — K5900 Constipation, unspecified: Secondary | ICD-10-CM

## 2013-11-05 DIAGNOSIS — G4700 Insomnia, unspecified: Secondary | ICD-10-CM

## 2013-11-05 DIAGNOSIS — F0390 Unspecified dementia without behavioral disturbance: Secondary | ICD-10-CM

## 2013-11-05 DIAGNOSIS — I10 Essential (primary) hypertension: Secondary | ICD-10-CM

## 2013-11-05 DIAGNOSIS — J449 Chronic obstructive pulmonary disease, unspecified: Secondary | ICD-10-CM

## 2013-11-05 DIAGNOSIS — D649 Anemia, unspecified: Secondary | ICD-10-CM

## 2013-11-05 NOTE — Progress Notes (Signed)
Patient ID: Joseph Ponce, male   DOB: Feb 11, 1924, 79 y.o.   MRN: [id number]    Nursing Home Location:  [facility]   Place of Service: SNF (31)  PCP: [name], MD  No Known Allergies  Chief Complaint  Patient presents with  . Medical Managment of Chronic Issues    HPI:  79 year old male who is a LTR of [place] with a pmh of dementia, OP, FTT, CVA, anemia, hyperlipidemia, PVD and htn. Pt is being seen today for routine follow up on chronic conditions. Pt is without complaints during visit and nursing does not have concerns at this time.  Review of Systems:  pleasantly confused and therefore unable to obtain ROS  Past Medical History  Diagnosis Date  . Dementia   . Muscle weakness   . Osteoporosis   . Alzheimer disease   . Failure to thrive in adult   . Dementia   . CVA (cerebral vascular accident)     speech and language d/o deficits  . Weakness   . Hyperlipemia   . Dysphagia   . Anemia   . PVD (peripheral vascular disease)   . COPD (chronic obstructive pulmonary disease)   . Glaucoma (increased eye pressure)   . Hypertension   . S/P BKA (below knee amputation) unilateral 01/21/2006    left  . Renal artery stenosis    Past Surgical History  Procedure Laterality Date  . Leg amputation below knee      left   Social History:   reports that he has quit smoking. He does not have any smokeless tobacco history on file. He reports that he does not drink alcohol or use illicit drugs.  No family history on file.  Medications: Patient's Medications  New Prescriptions   No medications on file  Previous Medications   AMLODIPINE (NORVASC) 10 MG TABLET    Take 10 mg by mouth daily.   ASPIRIN 81 MG CHEWABLE TABLET    Chew 81 mg by mouth daily.   BRIMONIDINE (ALPHAGAN P) 0.1 % SOLN    Place 1 drop into both eyes 2 (two) times daily.   CALCIUM-VITAMIN D (OSCAL WITH D) 500-200 MG-UNIT PER TABLET    Take 1 tablet by mouth 2 (two) times daily.   CLOPIDOGREL (PLAVIX) 75 MG TABLET    Take 75 mg by mouth daily.   DENOSUMAB (PROLIA) 60 MG/ML SOLN INJECTION    Inject 60 mg into the skin every 6 (six) months. Next dose due April 2014   DESMOPRESSIN (DDAVP) 0.01 % SOLN    Place 1 spray into the nose 3 (three) times daily.   DONEPEZIL (ARICEPT) 10 MG TABLET    Take 10 mg by mouth at bedtime as needed.   DORZOLAMIDE (TRUSOPT) 2 % OPHTHALMIC SOLUTION    Place 1 drop into both eyes 2 (two) times daily.   GABAPENTIN (NEURONTIN) 100 MG CAPSULE    Take 100 mg by mouth 2 (two) times daily.   HYDROCODONE-ACETAMINOPHEN (NORCO) 5-325 MG PER TABLET    Take one tablet by mouth four times daily for pain   HYDROXYPROPYL METHYLCELLULOSE (ISOPTO TEARS) 2.5 % OPHTHALMIC SOLUTION    Place 1 drop into both eyes 3 (three) times daily.   MAGNESIUM HYDROXIDE (MILK OF MAGNESIA) 400 MG/5ML SUSPENSION    Take 30 mLs by mouth daily as needed. For constipation   MEMANTINE HCL ER (NAMENDA XR) 28 MG CP24    Take 28 mg by mouth daily.   MULTIPLE  VITAMIN (MULTIVITAMIN WITH MINERALS) TABS    Take 1 tablet by mouth daily.   NITROGLYCERIN (NITRODUR - DOSED IN MG/24 HR) 0.2 MG/HR    Place 1 patch onto the skin daily.   POLYETHYLENE GLYCOL (MIRALAX / GLYCOLAX) PACKET    Take 17 g by mouth daily.   TRAVOPROST, BAK FREE, (TRAVATAN) 0.004 % SOLN OPHTHALMIC SOLUTION    Place 1 drop into both eyes at bedtime.   TRAZODONE (DESYREL) 50 MG TABLET    Take 25 mg by mouth at bedtime.  Modified Medications   No medications on file  Discontinued Medications   No medications on file     Physical Exam: Physical Exam  Vitals reviewed. Constitutional: He is well-developed, well-nourished, and in no distress. No distress.  Eyes: Conjunctivae and EOM are normal. Pupils are equal, round, and reactive to light.  Neck: Normal range of motion. Neck supple.  Cardiovascular: Normal rate, regular rhythm and normal heart sounds.   Pulmonary/Chest: Effort normal and breath sounds normal. No  respiratory distress.  Abdominal: Soft. Bowel sounds are normal. He exhibits no distension.  Musculoskeletal: He exhibits no edema and no tenderness.  Left BKA; self propels in [facility]  Neurological: He is alert.  Skin: Skin is warm and dry. He is not diaphoretic.    Filed Vitals:   11/05/13 1607  BP: 140/73  Pulse: 60  Temp: 98 F (36.7 C)  Resp: 20      Labs reviewed: 01-01-13: wbc 4.1;hgb 8.9; hct 28.2; mcv 82.7; plt 213; glucose 81; bun 28; creat 1.70; k+ 4.3  Na++ 138; t. Bili0.3; alk phos 32; ast 13; alt <8; albumin 2.9; chol 95; ldl 49; trig 67  08/01/13  Sodium 141, potassium, 4.0, glucose 91, BUN 31, Cr 1.68, ALT 9, AST 19  Albumin 3.5  hgb a1c 5.9,  09-03-13 wbc 5.3, hgb 10.3, hct 31.8, plt 204  Assessment/Plan 1. Hypertension Patient is stable; continue current regimen. Will monitor and make changes as necessary.  2. COPD (chronic obstructive pulmonary disease) -remains without complaints, will cont to monitor  3. Unspecified constipation -stable at this time  4. Dementia without behavioral disturbance -currently on aricept and namenda, does well in current setting  5. Insomnia -stable on trazodone 25 mg qhs  6. Anemia -hgb improved from April to Oct; will cont to monitor

## 2013-11-20 ENCOUNTER — Non-Acute Institutional Stay (SKILLED_NURSING_FACILITY): Payer: Medicare Other | Admitting: Internal Medicine

## 2013-11-20 DIAGNOSIS — N10 Acute tubulo-interstitial nephritis: Secondary | ICD-10-CM

## 2013-11-20 NOTE — Progress Notes (Signed)
Patient ID: Joseph ReedyJohn Ponce, male   DOB: 09/02/1924, 80 y.o.   MRN: [id number] [facility] Chief complaint; poor urine output, question obstruction History; this is a man who has significant dementia also a remote cerebrovascular accident. Been noted by the staff to have a very poor urine output in fact he did not void over 92. He is also not eating and drinking well. Staff attempted to in and out cath him to see if he was in retention and to obtain a specimen however he refused this  Review of systems; Gen. patient states that he is generally not well Respiratory no cough no sputum Cardiac no chest pain GU he is not complaining of dysuria  Physical examination Temperature is 97.6 pulse 80 respirations 18 O2 sat is 96% on room air. Blood pressure 128/56 Respiratory clear entry bilaterally Cardiac; heart sounds are normal he does not appear to be dehydrated Abdomen; no liver no spleen no tenderness GU he is not in retention he has voided in his incontinence garment that however he has significant right costovertebral angle tenderness right flank tenderness  Impression/plan #1 probable UTI/pyelonephritis I'm going to start him empirically on ciprofloxacin. I will check his lab work tomorrow. A specimen for C&S has apparently been obtained

## 2013-11-29 ENCOUNTER — Other Ambulatory Visit: Payer: Self-pay | Admitting: *Deleted

## 2013-11-29 DIAGNOSIS — G894 Chronic pain syndrome: Secondary | ICD-10-CM

## 2013-11-29 MED ORDER — HYDROCODONE-ACETAMINOPHEN 5-325 MG PO TABS: ORAL_TABLET | ORAL | Status: DC

## 2013-12-03 ENCOUNTER — Non-Acute Institutional Stay (SKILLED_NURSING_FACILITY): Payer: Medicare Other | Admitting: Nurse Practitioner

## 2013-12-03 DIAGNOSIS — S88119A Complete traumatic amputation at level between knee and ankle, unspecified lower leg, initial encounter: Secondary | ICD-10-CM

## 2013-12-03 DIAGNOSIS — H409 Unspecified glaucoma: Secondary | ICD-10-CM

## 2013-12-03 DIAGNOSIS — M810 Age-related osteoporosis without current pathological fracture: Secondary | ICD-10-CM

## 2013-12-03 DIAGNOSIS — G4700 Insomnia, unspecified: Secondary | ICD-10-CM

## 2013-12-03 DIAGNOSIS — J449 Chronic obstructive pulmonary disease, unspecified: Secondary | ICD-10-CM

## 2013-12-03 DIAGNOSIS — G894 Chronic pain syndrome: Secondary | ICD-10-CM

## 2013-12-03 DIAGNOSIS — K5900 Constipation, unspecified: Secondary | ICD-10-CM

## 2013-12-03 DIAGNOSIS — Z89519 Acquired absence of unspecified leg below knee: Secondary | ICD-10-CM

## 2013-12-03 DIAGNOSIS — D649 Anemia, unspecified: Secondary | ICD-10-CM

## 2013-12-03 DIAGNOSIS — F0390 Unspecified dementia without behavioral disturbance: Secondary | ICD-10-CM

## 2013-12-03 DIAGNOSIS — I10 Essential (primary) hypertension: Secondary | ICD-10-CM

## 2013-12-03 DIAGNOSIS — I739 Peripheral vascular disease, unspecified: Secondary | ICD-10-CM

## 2013-12-03 NOTE — Progress Notes (Signed)
Patient ID: Joseph Ponce, male   DOB: February 19, 1924, 80 y.o.   MRN: [id number]    Nursing Home Location:  [place] of Service: SNF (31)  PCP: Cyndee Brightly, MD  No Known Allergies  Chief Complaint  Patient presents with  . Medical Managment of Chronic Issues    HPI:  80 year old male who is a LTR of [place] with a pmh of dementia, OP, FTT, CVA, anemia, hyperlipidemia, PVD and htn. Pt is being seen today for routine follow up on chronic conditions. Pt is without complaints during visit and nursing does not have concerns at this time.   Review of Systems:  Review of Systems  Constitutional: Negative for fever and chills.  Eyes: Negative.   Respiratory: Negative for cough and shortness of breath.   Cardiovascular: Negative for chest pain.  Gastrointestinal: Negative for heartburn, abdominal pain, diarrhea and constipation.  Genitourinary: Negative for dysuria, urgency and frequency.  Musculoskeletal: Negative for myalgias.  Skin: Negative.   Neurological: Negative for dizziness and headaches.  Psychiatric/Behavioral: Positive for memory loss. Negative for depression. The patient is not nervous/anxious and does not have insomnia.      Past Medical History  Diagnosis Date  . Dementia   . Muscle weakness   . Osteoporosis   . Alzheimer disease   . Failure to thrive in adult   . Dementia   . CVA (cerebral vascular accident)     speech and language d/o deficits  . Weakness   . Hyperlipemia   . Dysphagia   . Anemia   . PVD (peripheral vascular disease)   . COPD (chronic obstructive pulmonary disease)   . Glaucoma (increased eye pressure)   . Hypertension   . S/P BKA (below knee amputation) unilateral 01/21/2006    left  . Renal artery stenosis    Past Surgical History  Procedure Laterality Date  . Leg amputation below knee      left   Social History:   reports that he has quit smoking. He does not have any smokeless tobacco history  on file. He reports that he does not drink alcohol or use illicit drugs.  No family history on file.  Medications: Patient's Medications  New Prescriptions   No medications on file  Previous Medications   AMLODIPINE (NORVASC) 10 MG TABLET    Take 10 mg by mouth daily.   ASPIRIN 81 MG CHEWABLE TABLET    Chew 81 mg by mouth daily.   BRIMONIDINE (ALPHAGAN P) 0.1 % SOLN    Place 1 drop into both eyes 2 (two) times daily.   CALCIUM-VITAMIN D (OSCAL WITH D) 500-200 MG-UNIT PER TABLET    Take 1 tablet by mouth 2 (two) times daily.   CLOPIDOGREL (PLAVIX) 75 MG TABLET    Take 75 mg by mouth daily.   DENOSUMAB (PROLIA) 60 MG/ML SOLN INJECTION    Inject 60 mg into the skin every 6 (six) months. Next dose due April 2014   DESMOPRESSIN (DDAVP) 0.01 % SOLN    Place 1 spray into the nose 3 (three) times daily.   DONEPEZIL (ARICEPT) 10 MG TABLET    Take 10 mg by mouth at bedtime as needed.   DORZOLAMIDE (TRUSOPT) 2 % OPHTHALMIC SOLUTION    Place 1 drop into both eyes 2 (two) times daily.   GABAPENTIN (NEURONTIN) 100 MG CAPSULE    Take 100 mg by mouth 2 (two) times daily.   HYDROCODONE-ACETAMINOPHEN (NORCO) 5-325 MG PER TABLET  Take one tablet by mouth four times daily for pain   HYDROXYPROPYL METHYLCELLULOSE (ISOPTO TEARS) 2.5 % OPHTHALMIC SOLUTION    Place 1 drop into both eyes 3 (three) times daily.   MAGNESIUM HYDROXIDE (MILK OF MAGNESIA) 400 MG/5ML SUSPENSION    Take 30 mLs by mouth daily as needed. For constipation   MEMANTINE HCL ER (NAMENDA XR) 28 MG CP24    Take 28 mg by mouth daily.   MULTIPLE VITAMIN (MULTIVITAMIN WITH MINERALS) TABS    Take 1 tablet by mouth daily.   NITROGLYCERIN (NITRODUR - DOSED IN MG/24 HR) 0.2 MG/HR    Place 1 patch onto the skin daily.   POLYETHYLENE GLYCOL (MIRALAX / GLYCOLAX) PACKET    Take 17 g by mouth daily.   TRAVOPROST, BAK FREE, (TRAVATAN) 0.004 % SOLN OPHTHALMIC SOLUTION    Place 1 drop into both eyes at bedtime.   TRAZODONE (DESYREL) 50 MG TABLET    Take 25 mg  by mouth at bedtime.  Modified Medications   No medications on file  Discontinued Medications   No medications on file     Physical Exam: -Physical Exam  Vitals reviewed. Constitutional: He is well-developed, well-nourished, and in no distress. No distress.  Eyes: Conjunctivae and EOM are normal. Pupils are equal, round, and reactive to light.  Neck: Normal range of motion. Neck supple.  Cardiovascular: Normal rate, regular rhythm and normal heart sounds.   Pulmonary/Chest: Effort normal and breath sounds normal. No respiratory distress.  Abdominal: Soft. Bowel sounds are normal. He exhibits no distension.  Musculoskeletal: He exhibits no edema and no tenderness.  Left BKA; self propels in [facility]  Neurological: He is alert.  Skin: Skin is warm and dry. He is not diaphoretic.     Filed Vitals:   12/03/13 1254  BP: 152/68  Pulse: 60  Temp: 97.7 F (36.5 C)  Resp: 20   Labs reviewed:  01-01-13: wbc 4.1;hgb 8.9; hct 28.2; mcv 82.7; plt 213; glucose 81; bun 28; creat 1.70; k+ 4.3  Na++ 138; t. Bili0.3; alk phos 32; ast 13; alt <8; albumin 2.9; chol 95; ldl 49; trig 67  08/01/13  Sodium 141, potassium, 4.0, glucose 91, BUN 31, Cr 1.68, ALT 9, AST 19  Albumin 3.5  hgb a1c 5.9,  09-03-13 wbc 5.3, hgb 10.3, hct 31.8, plt 204 CBC NO Diff (Complete Blood Count)    Result: 11/26/2013 11:03 AM   ( Status: F )     C WBC 6.5     4.0-10.5 K/uL SLN   RBC 4.22     4.22-5.81 MIL/uL SLN   Hemoglobin 10.9   L 13.0-17.0 g/dL SLN   Hematocrit 33.8   L 39.0-52.0 % SLN   MCV 80.1     78.0-100.0 fL SLN   MCH 25.8   L 26.0-34.0 pg SLN   MCHC 32.2     30.0-36.0 g/dL SLN   RDW 16.5   H 11.5-15.5 % SLN   Platelet Count 226     150-400 K/uL SLN   Basic Metabolic Panel    Result: 11/26/2013 11:21 AM   ( Status: F )       Sodium 145     135-145 mEq/L SLN   Potassium 4.6     3.5-5.3 mEq/L SLN   Chloride 110     96-112 mEq/L SLN   CO2 32     19-32 mEq/L SLN   Glucose 85     70-99 mg/dL SLN   BUN 34  H 6-23 mg/dL SLN   Creatinine 1.72   H 0.50-1.35 mg/dL SLN   Calcium 9.7     8.4-10.5 mg/dL SLN     Assessment/Plan 1. Hypertension -Patient is stable; continue current regimen. Will monitor and make changes as necessary.  2. COPD (chronic obstructive pulmonary disease) -pt without changes in shortness of breath, cough or congestion  3. Unspecified constipation -stable  4. Dementia without behavioral disturbance -conts on aricept and namenda   5. ANEMIA -stable on last CBC  6. S/P BKA (below knee amputation) unilateral conts on ASA; stable  7. PVD (peripheral vascular disease) S/p BKA; stable; conts on ASA  8. Insomnia -does well on trazodone 25 mg BID  9. Chronic pain syndrome -unchanged; stable on current medications  10. Glaucoma (increased eye pressure) -conts eye drops  11. Osteoporosis, unspecified -to get prolia injection monthly

## 2013-12-31 ENCOUNTER — Other Ambulatory Visit: Payer: Self-pay | Admitting: *Deleted

## 2013-12-31 ENCOUNTER — Non-Acute Institutional Stay (SKILLED_NURSING_FACILITY): Payer: Medicare Other | Admitting: Nurse Practitioner

## 2013-12-31 ENCOUNTER — Encounter: Payer: Self-pay | Admitting: Nurse Practitioner

## 2013-12-31 DIAGNOSIS — J449 Chronic obstructive pulmonary disease, unspecified: Secondary | ICD-10-CM

## 2013-12-31 DIAGNOSIS — K5900 Constipation, unspecified: Secondary | ICD-10-CM

## 2013-12-31 DIAGNOSIS — I739 Peripheral vascular disease, unspecified: Secondary | ICD-10-CM

## 2013-12-31 DIAGNOSIS — I10 Essential (primary) hypertension: Secondary | ICD-10-CM

## 2013-12-31 DIAGNOSIS — H409 Unspecified glaucoma: Secondary | ICD-10-CM

## 2013-12-31 DIAGNOSIS — G4700 Insomnia, unspecified: Secondary | ICD-10-CM

## 2013-12-31 DIAGNOSIS — I2510 Atherosclerotic heart disease of native coronary artery without angina pectoris: Secondary | ICD-10-CM

## 2013-12-31 DIAGNOSIS — G894 Chronic pain syndrome: Secondary | ICD-10-CM

## 2013-12-31 DIAGNOSIS — F0390 Unspecified dementia without behavioral disturbance: Secondary | ICD-10-CM

## 2013-12-31 MED ORDER — HYDROCODONE-ACETAMINOPHEN 5-325 MG PO TABS: ORAL_TABLET | ORAL | Status: DC

## 2013-12-31 NOTE — Telephone Encounter (Signed)
[facility] 

## 2013-12-31 NOTE — Progress Notes (Signed)
Patient ID: Joseph Ponce, male   DOB: [birth year], 80 y.o.   MRN: [id number]    Nursing Home Location:  [place] of Service: SNF (31)  PCP: Cyndee Brightly, MD  No Known Allergies  Chief Complaint  Patient presents with  . Medical Managment of Chronic Issues    HPI:  80 year old male who is a LTR of [facility] with a pmh of dementia, OP, FTT, CVA, anemia, hyperlipidemia, PVD and htn. Pt is being seen today for routine follow up on chronic conditions. there has been no changes reported in the last month; Pt is without complaints during visit and nursing does not have concerns at this time.    Review of Systems:  Review of Systems  Constitutional: Negative for fever and chills.  Eyes: Negative.   Respiratory: Negative for cough and shortness of breath.   Cardiovascular: Negative for chest pain.  Gastrointestinal: Negative for heartburn, abdominal pain, diarrhea and constipation.  Genitourinary: Negative for dysuria, urgency and frequency.  Musculoskeletal: Negative for myalgias.  Skin: Negative.   Neurological: Negative for dizziness and headaches.  Psychiatric/Behavioral: Positive for memory loss. Negative for depression. The patient is not nervous/anxious and does not have insomnia.      Past Medical History  Diagnosis Date  . Dementia   . Muscle weakness   . Osteoporosis   . Alzheimer disease   . Failure to thrive in adult   . Dementia   . CVA (cerebral vascular accident)     speech and language d/o deficits  . Weakness   . Hyperlipemia   . Dysphagia   . Anemia   . PVD (peripheral vascular disease)   . COPD (chronic obstructive pulmonary disease)   . Glaucoma (increased eye pressure)   . Hypertension   . S/P BKA (below knee amputation) unilateral 01/21/2006    left  . Renal artery stenosis    Past Surgical History  Procedure Laterality Date  . Leg amputation below knee      left   Social History:   reports that he has quit  smoking. He does not have any smokeless tobacco history on file. He reports that he does not drink alcohol or use illicit drugs.  No family history on file.  Medications: Patient's Medications  New Prescriptions   No medications on file  Previous Medications   AMLODIPINE (NORVASC) 10 MG TABLET    Take 10 mg by mouth daily.   ASPIRIN 81 MG CHEWABLE TABLET    Chew 81 mg by mouth daily.   BRIMONIDINE (ALPHAGAN P) 0.1 % SOLN    Place 1 drop into both eyes 2 (two) times daily.   CALCIUM-VITAMIN D (OSCAL WITH D) 500-200 MG-UNIT PER TABLET    Take 1 tablet by mouth 2 (two) times daily.   CLOPIDOGREL (PLAVIX) 75 MG TABLET    Take 75 mg by mouth daily.   DENOSUMAB (PROLIA) 60 MG/ML SOLN INJECTION    Inject 60 mg into the skin every 6 (six) months. Next dose due April 2014   DESMOPRESSIN (DDAVP) 0.01 % SOLN    Place 1 spray into the nose 3 (three) times daily.   DONEPEZIL (ARICEPT) 10 MG TABLET    Take 10 mg by mouth at bedtime as needed.   DORZOLAMIDE (TRUSOPT) 2 % OPHTHALMIC SOLUTION    Place 1 drop into both eyes 2 (two) times daily.   GABAPENTIN (NEURONTIN) 100 MG CAPSULE    Take 100 mg by mouth 2 (  two) times daily.   HYDROCODONE-ACETAMINOPHEN (NORCO) 5-325 MG PER TABLET    Take one tablet by mouth four times daily for pain   HYDROXYPROPYL METHYLCELLULOSE (ISOPTO TEARS) 2.5 % OPHTHALMIC SOLUTION    Place 1 drop into both eyes 3 (three) times daily.   MAGNESIUM HYDROXIDE (MILK OF MAGNESIA) 400 MG/5ML SUSPENSION    Take 30 mLs by mouth daily as needed. For constipation   MEMANTINE HCL ER (NAMENDA XR) 28 MG CP24    Take 28 mg by mouth daily.   MULTIPLE VITAMIN (MULTIVITAMIN WITH MINERALS) TABS    Take 1 tablet by mouth daily.   NITROGLYCERIN (NITRODUR - DOSED IN MG/24 HR) 0.2 MG/HR    Place 1 patch onto the skin daily.   POLYETHYLENE GLYCOL (MIRALAX / GLYCOLAX) PACKET    Take 17 g by mouth daily.   TRAVOPROST, BAK FREE, (TRAVATAN) 0.004 % SOLN OPHTHALMIC SOLUTION    Place 1 drop into both eyes at  bedtime.   TRAZODONE (DESYREL) 50 MG TABLET    Take 25 mg by mouth at bedtime.  Modified Medications   No medications on file  Discontinued Medications   No medications on file     Physical Exam: Physical Exam  Vitals reviewed. Constitutional: He is well-developed, well-nourished, and in no distress. No distress.  Eyes: Conjunctivae and EOM are normal. Pupils are equal, round, and reactive to light.  Neck: Normal range of motion. Neck supple.  Cardiovascular: Normal rate, regular rhythm and normal heart sounds.   Pulmonary/Chest: Effort normal and breath sounds normal. No respiratory distress.  Abdominal: Soft. Bowel sounds are normal. He exhibits no distension.  Musculoskeletal: He exhibits no edema and no tenderness.  Left BKA; self propels in [facility]  Neurological: He is alert.  Skin: Skin is warm and dry. He is not diaphoretic.    Filed Vitals:   12/31/13 1616  BP: 128/60  Pulse: 72  Temp: 98.1 F (36.7 C)  Resp: 20      Labs reviewed: 01-01-13: wbc 4.1;hgb 8.9; hct 28.2; mcv 82.7; plt 213; glucose 81; bun 28; creat 1.70; k+ 4.3  Na++ 138; t. Bili0.3; alk phos 32; ast 13; alt <8; albumin 2.9; chol 95; ldl 49; trig 67  08/01/13  Sodium 141, potassium, 4.0, glucose 91, BUN 31, Cr 1.68, ALT 9, AST 19  Albumin 3.5  hgb a1c 5.9,  09-03-13 wbc 5.3, hgb 10.3, hct 31.8, plt 204  CBC NO Diff (Complete Blood Count)  Result: 11/26/2013 11:03 AM ( Status: F ) C  WBC 6.5 4.0-10.5 K/uL SLN  RBC 4.22 4.22-5.81 MIL/uL SLN  Hemoglobin 10.9 L 13.0-17.0 g/dL SLN  Hematocrit 33.8 L 39.0-52.0 % SLN  MCV 80.1 78.0-100.0 fL SLN  MCH 25.8 L 26.0-34.0 pg SLN  MCHC 32.2 30.0-36.0 g/dL SLN  RDW 16.5 H 11.5-15.5 % SLN  Platelet Count 226 150-400 K/uL SLN  Basic Metabolic Panel  Result: 2/94/7654 11:21 AM ( Status: F )  Sodium 145 135-145 mEq/L SLN  Potassium 4.6 3.5-5.3 mEq/L SLN  Chloride 110 96-112 mEq/L SLN  CO2 32 19-32 mEq/L SLN  Glucose 85 70-99 mg/dL SLN  BUN 34 H 6-23 mg/dL SLN    Creatinine 1.72 H 0.50-1.35 mg/dL SLN  Calcium 9.7 8.4-10.5 mg/dL SLN      Assessment/Plan 1. Insomnia -stable on trazodone  2. Glaucoma (increased eye pressure) -considered legal blind; conts drops  3. Chronic pain syndrome -does not complain of pain during visit  4. Dementia without behavioral disturbance -does well in current  environment, conts on aricept and namenda   5. Unspecified constipation -stable on current medication  6. COPD (chronic obstructive pulmonary disease) -no changes in pulmonary disease  7. Hypertension -Patients blood pressure is stable; continue current regimen. Will monitor and make changes as necessary.  8. CAD (coronary artery disease) -stable without complaints of chest pain or discomfort, conts on nitrodur  9. PVD (peripheral vascular disease) -with left BKA; conts ASA and plavix

## 2014-01-07 ENCOUNTER — Other Ambulatory Visit: Payer: Self-pay | Admitting: *Deleted

## 2014-01-07 DIAGNOSIS — G894 Chronic pain syndrome: Secondary | ICD-10-CM

## 2014-01-07 MED ORDER — HYDROCODONE-ACETAMINOPHEN 5-325 MG PO TABS: ORAL_TABLET | ORAL | Status: DC

## 2014-01-07 NOTE — Telephone Encounter (Signed)
[facility] 

## 2014-01-14 ENCOUNTER — Non-Acute Institutional Stay (SKILLED_NURSING_FACILITY): Payer: Medicare Other | Admitting: Nurse Practitioner

## 2014-01-14 DIAGNOSIS — M79609 Pain in unspecified limb: Secondary | ICD-10-CM

## 2014-01-14 DIAGNOSIS — M79651 Pain in right thigh: Secondary | ICD-10-CM

## 2014-01-14 NOTE — Progress Notes (Signed)
Patient ID: Joseph ReedyJohn Ponce, male   DOB: 11-02-1924, 80 y.o.   MRN: [id number]   [facility]   Place of Service: SNF (31)  PCP: Joseph SleeperOBSON,MICHAEL GAVIN, MD  No Known Allergies  HPI:  This is an acute visit for follow up of complaints of right thigh pain as reported by nursing staff this morning. Patient reports pain has been noticed for approximately one week and denies injury. Says, "nurse brings me a pain pill and that helps".  Patient is mostly non-mobile, but does minimal exercises with PT. Some pain with weight bearing, although patient rarely stands. Has prosthetic at bedside. PT has been working with painful area without success.    Review of Systems:  Review of Systems  Constitutional: Negative for fever and chills.  Eyes: Negative.   Respiratory: Negative.  Negative for shortness of breath.   Cardiovascular: Negative.  Negative for chest pain and palpitations.  Gastrointestinal: Negative.   Genitourinary: Negative.   Musculoskeletal: Positive for myalgias. Negative for falls.  Skin: Negative for rash.  Neurological: Negative.  Negative for tingling, sensory change and headaches.  Endo/Heme/Allergies: Negative.   Psychiatric/Behavioral: Negative.      Past Medical History  Diagnosis Date  . Dementia   . Muscle weakness   . Osteoporosis   . Alzheimer disease   . Failure to thrive in adult   . Dementia   . CVA (cerebral vascular accident)     speech and language d/o deficits  . Weakness   . Hyperlipemia   . Dysphagia   . Anemia   . PVD (peripheral vascular disease)   . COPD (chronic obstructive pulmonary disease)   . Glaucoma (increased eye pressure)   . Hypertension   . S/P BKA (below knee amputation) unilateral 01/21/2006    left  . Renal artery stenosis    Past Surgical History  Procedure Laterality Date  . Leg amputation below knee      left   Social History:   reports that he has quit smoking. He does not have any  smokeless tobacco history on file. He reports that he does not drink alcohol or use illicit drugs.  No family history on file.  Medications: Patient's Medications  New Prescriptions   No medications on file  Previous Medications   AMLODIPINE (NORVASC) 10 MG TABLET    Take 10 mg by mouth daily.   ASPIRIN 81 MG CHEWABLE TABLET    Chew 81 mg by mouth daily.   BRIMONIDINE (ALPHAGAN P) 0.1 % SOLN    Place 1 drop into both eyes 2 (two) times daily.   CALCIUM-VITAMIN D (OSCAL WITH D) 500-200 MG-UNIT PER TABLET    Take 1 tablet by mouth 2 (two) times daily.   CLOPIDOGREL (PLAVIX) 75 MG TABLET    Take 75 mg by mouth daily.   DENOSUMAB (PROLIA) 60 MG/ML SOLN INJECTION    Inject 60 mg into the skin every 6 (six) months. Next dose due April 2014   DESMOPRESSIN (DDAVP) 0.01 % SOLN    Place 1 spray into the nose 3 (three) times daily.   DONEPEZIL (ARICEPT) 10 MG TABLET    Take 10 mg by mouth at bedtime as needed.   DORZOLAMIDE (TRUSOPT) 2 % OPHTHALMIC SOLUTION    Place 1 drop into both eyes 2 (two) times daily.   GABAPENTIN (NEURONTIN) 100 MG CAPSULE    Take 100 mg by mouth 2 (two) times daily.   HYDROCODONE-ACETAMINOPHEN (NORCO) 5-325 MG PER  TABLET    Take one tablet by mouth four times daily for pain   HYDROXYPROPYL METHYLCELLULOSE (ISOPTO TEARS) 2.5 % OPHTHALMIC SOLUTION    Place 1 drop into both eyes 3 (three) times daily.   MAGNESIUM HYDROXIDE (MILK OF MAGNESIA) 400 MG/5ML SUSPENSION    Take 30 mLs by mouth daily as needed. For constipation   MEMANTINE HCL ER (NAMENDA XR) 28 MG CP24    Take 28 mg by mouth daily.   MULTIPLE VITAMIN (MULTIVITAMIN WITH MINERALS) TABS    Take 1 tablet by mouth daily.   NITROGLYCERIN (NITRODUR - DOSED IN MG/24 HR) 0.2 MG/HR    Place 1 patch onto the skin daily.   POLYETHYLENE GLYCOL (MIRALAX / GLYCOLAX) PACKET    Take 17 g by mouth daily.   TRAVOPROST, BAK FREE, (TRAVATAN) 0.004 % SOLN OPHTHALMIC SOLUTION    Place 1 drop into both eyes at bedtime.   TRAZODONE (DESYREL)  50 MG TABLET    Take 25 mg by mouth at bedtime.  Modified Medications   No medications on file  Discontinued Medications   No medications on file     Physical Exam: Filed Vitals:   01/14/14 1457  BP: 146/67  Pulse: 57  Temp: 97.4 F (36.3 C)  Resp: 16    Physical Exam  Constitutional: No distress.  Eyes: Pupils are equal, round, and reactive to light.  Cardiovascular: Normal rate and regular rhythm.   Pulmonary/Chest: Effort normal and breath sounds normal. No respiratory distress.  Abdominal: He exhibits no distension.  Musculoskeletal: He exhibits no edema. Tenderness: right thight.  Left lower leg amputation. Right leg with intact pedal pulse, no edema. ROM at baseline.    Neurological: He is alert.  Skin: Skin is warm and dry.     Assessment/Plan 1. Right thigh pain -Muscle pain with no visual abnormalities. Will try Robaxin BID, but with care to avoid sedation. Will monitor for resolving of symptoms or need for further intervention. Will also add Biofreeze TID for symptomatic management.

## 2014-01-28 ENCOUNTER — Non-Acute Institutional Stay (SKILLED_NURSING_FACILITY): Payer: Medicare Other | Admitting: Nurse Practitioner

## 2014-01-28 ENCOUNTER — Encounter: Payer: Self-pay | Admitting: Nurse Practitioner

## 2014-01-28 DIAGNOSIS — M79609 Pain in unspecified limb: Secondary | ICD-10-CM

## 2014-01-28 DIAGNOSIS — I10 Essential (primary) hypertension: Secondary | ICD-10-CM

## 2014-01-28 DIAGNOSIS — I2510 Atherosclerotic heart disease of native coronary artery without angina pectoris: Secondary | ICD-10-CM

## 2014-01-28 DIAGNOSIS — D649 Anemia, unspecified: Secondary | ICD-10-CM

## 2014-01-28 DIAGNOSIS — M79651 Pain in right thigh: Secondary | ICD-10-CM

## 2014-01-28 DIAGNOSIS — K5900 Constipation, unspecified: Secondary | ICD-10-CM

## 2014-01-28 DIAGNOSIS — G4700 Insomnia, unspecified: Secondary | ICD-10-CM

## 2014-01-28 DIAGNOSIS — M810 Age-related osteoporosis without current pathological fracture: Secondary | ICD-10-CM

## 2014-01-28 DIAGNOSIS — F0390 Unspecified dementia without behavioral disturbance: Secondary | ICD-10-CM

## 2014-01-28 NOTE — Progress Notes (Signed)
Patient ID: Joseph Ponce, male   DOB: 08-Jun-1924, 80 y.o.   MRN: [id number]    Nursing Home Location:  [place] of Service: SNF (31)  PCP: Cyndee Brightly, MD  No Known Allergies  Chief Complaint  Patient presents with  . Medical Managment of Chronic Issues    HPI:  80 year old male who is a LTR of [facility] with a pmh of dementia, OP, FTT, CVA, anemia, hyperlipidemia, PVD and htn. Pt is being seen today for routine follow up on chronic conditions; in the last month pt was seen for thigh pain and treated with muscle relaxant and rubs, with what seems to be good effects Pt is without complaints during visit and nursing does not have concerns at this time.    Review of Systems:  Review of Systems  Constitutional: Negative for fever, chills and malaise/fatigue.  Eyes: Negative.   Respiratory: Negative for cough and shortness of breath.   Cardiovascular: Negative for chest pain and leg swelling.  Gastrointestinal: Negative for heartburn, abdominal pain, diarrhea and constipation.  Genitourinary: Negative for dysuria, urgency and frequency.  Musculoskeletal: Negative for joint pain and myalgias.  Skin: Negative.   Neurological: Negative for dizziness, weakness and headaches.  Psychiatric/Behavioral: Positive for memory loss. Negative for depression. The patient is not nervous/anxious and does not have insomnia.       Past Medical History  Diagnosis Date  . Dementia   . Muscle weakness   . Osteoporosis   . Alzheimer disease   . Failure to thrive in adult   . Dementia   . CVA (cerebral vascular accident)     speech and language d/o deficits  . Weakness   . Hyperlipemia   . Dysphagia   . Anemia   . PVD (peripheral vascular disease)   . COPD (chronic obstructive pulmonary disease)   . Glaucoma (increased eye pressure)   . Hypertension   . S/P BKA (below knee amputation) unilateral 01/21/2006    left  . Renal artery stenosis    Past  Surgical History  Procedure Laterality Date  . Leg amputation below knee      left   Social History:   reports that he has quit smoking. He does not have any smokeless tobacco history on file. He reports that he does not drink alcohol or use illicit drugs.  No family history on file.  Medications: Patient's Medications  New Prescriptions   No medications on file  Previous Medications   AMLODIPINE (NORVASC) 10 MG TABLET    Take 10 mg by mouth daily.   ASPIRIN 81 MG CHEWABLE TABLET    Chew 81 mg by mouth daily.   BRIMONIDINE (ALPHAGAN P) 0.1 % SOLN    Place 1 drop into both eyes 2 (two) times daily.   CALCIUM-VITAMIN D (OSCAL WITH D) 500-200 MG-UNIT PER TABLET    Take 1 tablet by mouth 2 (two) times daily.   CLOPIDOGREL (PLAVIX) 75 MG TABLET    Take 75 mg by mouth daily.   DENOSUMAB (PROLIA) 60 MG/ML SOLN INJECTION    Inject 60 mg into the skin every 6 (six) months. Next dose due April 2014   DESMOPRESSIN (DDAVP) 0.01 % SOLN    Place 1 spray into the nose 3 (three) times daily.   DONEPEZIL (ARICEPT) 10 MG TABLET    Take 10 mg by mouth at bedtime as needed.   DORZOLAMIDE (TRUSOPT) 2 % OPHTHALMIC SOLUTION    Place 1 drop  into both eyes 2 (two) times daily.   GABAPENTIN (NEURONTIN) 100 MG CAPSULE    Take 100 mg by mouth 2 (two) times daily.   HYDROCODONE-ACETAMINOPHEN (NORCO) 5-325 MG PER TABLET    Take one tablet by mouth four times daily for pain   HYDROXYPROPYL METHYLCELLULOSE (ISOPTO TEARS) 2.5 % OPHTHALMIC SOLUTION    Place 1 drop into both eyes 3 (three) times daily.   MAGNESIUM HYDROXIDE (MILK OF MAGNESIA) 400 MG/5ML SUSPENSION    Take 30 mLs by mouth daily as needed. For constipation   MEMANTINE HCL ER (NAMENDA XR) 28 MG CP24    Take 28 mg by mouth daily.   METHOCARBAMOL (ROBAXIN) 500 MG TABLET    Take 500 mg by mouth 2 (two) times daily as needed for muscle spasms.   MULTIPLE VITAMIN (MULTIVITAMIN WITH MINERALS) TABS    Take 1 tablet by mouth daily.   NITROGLYCERIN (NITRODUR -  DOSED IN MG/24 HR) 0.2 MG/HR    Place 1 patch onto the skin daily.   POLYETHYLENE GLYCOL (MIRALAX / GLYCOLAX) PACKET    Take 17 g by mouth daily.   TRAVOPROST, BAK FREE, (TRAVATAN) 0.004 % SOLN OPHTHALMIC SOLUTION    Place 1 drop into both eyes at bedtime.   TRAZODONE (DESYREL) 50 MG TABLET    Take 25 mg by mouth at bedtime.  Modified Medications   No medications on file  Discontinued Medications   No medications on file     Physical Exam:  Filed Vitals:   01/28/14 1223  BP: 116/70  Pulse: 48  Temp: 98.1 F (36.7 C)  Resp: 18  Weight: 161 lb (73.029 kg)    Physical Exam  Vitals reviewed. Constitutional: He is well-developed, well-nourished, and in no distress. No distress.  Eyes: Conjunctivae and EOM are normal. Pupils are equal, round, and reactive to light.  Neck: Normal range of motion. Neck supple.  Cardiovascular: Normal rate, regular rhythm and normal heart sounds.   Pulmonary/Chest: Effort normal and breath sounds normal. No respiratory distress.  Abdominal: Soft. Bowel sounds are normal. He exhibits no distension.  Musculoskeletal: He exhibits no edema and no tenderness.  Left BKA; self propels in [facility]  Neurological: He is alert.  Skin: Skin is warm and dry. He is not diaphoretic.  Psychiatric: Affect normal.     Labs reviewed:  01-01-13: wbc 4.1;hgb 8.9; hct 28.2; mcv 82.7; plt 213; glucose 81; bun 28; creat 1.70; k+ 4.3  Na++ 138; t. Bili0.3; alk phos 32; ast 13; alt <8; albumin 2.9; chol 95; ldl 49; trig 67  08/01/13  Sodium 141, potassium, 4.0, glucose 91, BUN 31, Cr 1.68, ALT 9, AST 19  Albumin 3.5  hgb a1c 5.9,  09-03-13 wbc 5.3, hgb 10.3, hct 31.8, plt 204  CBC NO Diff (Complete Blood Count)  Result: 11/26/2013 11:03 AM ( Status: F ) C  WBC 6.5 4.0-10.5 K/uL SLN  RBC 4.22 4.22-5.81 MIL/uL SLN  Hemoglobin 10.9 L 13.0-17.0 g/dL SLN  Hematocrit 33.8 L 39.0-52.0 % SLN  MCV 80.1 78.0-100.0 fL SLN  MCH 25.8 L 26.0-34.0 pg SLN  MCHC 32.2 30.0-36.0 g/dL SLN    RDW 16.5 H 11.5-15.5 % SLN  Platelet Count 226 150-400 K/uL SLN  Basic Metabolic Panel  Result: 7/62/2633 11:21 AM ( Status: F )  Sodium 145 135-145 mEq/L SLN  Potassium 4.6 3.5-5.3 mEq/L SLN  Chloride 110 96-112 mEq/L SLN  CO2 32 19-32 mEq/L SLN  Glucose 85 70-99 mg/dL SLN  BUN 34 H 6-23 mg/dL  SLN  Creatinine 1.72 H 0.50-1.35 mg/dL SLN  Calcium 9.7 8.4-10.5 mg/dL SLN    Assessment/Plan 1. Hypertension -stable, heart rate low but this appears chronic on no medications to effect HR, ranges from high 40s- 80s on review, will get EKG at this time, will check TSH, CMP as well  2. CAD (coronary artery disease) -without chest pains, on plavix and asa  3. Dementia without behavioral disturbance -conts on namenda and aricept   4. Right thigh pain -this seems to have improved, conts on robaxin PRN  5. Insomnia -conts on trazodone which appears to be effective, no reports per staff or pt of ongoing insomnia   6. Unspecified constipation -conts on facility protocol and PRN without complaints at this time   7. Osteoporosis, unspecified -conts on oscal with D  8. Anemia -on prolia; hgb has been stable.

## 2014-01-29 ENCOUNTER — Non-Acute Institutional Stay (SKILLED_NURSING_FACILITY): Payer: Medicare Other | Admitting: Internal Medicine

## 2014-01-29 DIAGNOSIS — I2510 Atherosclerotic heart disease of native coronary artery without angina pectoris: Secondary | ICD-10-CM

## 2014-01-29 DIAGNOSIS — I498 Other specified cardiac arrhythmias: Secondary | ICD-10-CM

## 2014-01-29 DIAGNOSIS — R001 Bradycardia, unspecified: Secondary | ICD-10-CM

## 2014-01-29 NOTE — Progress Notes (Signed)
Patient ID: Joseph ReedyJohn Ponce, male   DOB: Mar 27, 1924, 80 y.o.   MRN: [id number] [facility] Chief complaint review of EKG/bradycardia History; patient was seen yesterday I believe for routine visit the. Discovered be bradycardic with a pulse rate in the 40s. An EKG has been ordered which I am reviewing today. EKG shows probable first degree AV block and sinus bradycardia. Otherwise it does not seem to be any major abnormalities here. Certainly no evidence of ischemia. He does carry the diagnosis of coronary artery disease also peripheral vascular disease history of cerebrovascular disease. He certainly would be at risk for coronary artery disease so. I am reviewing this today.  Past Medical History  Diagnosis Date  . Dementia   . Muscle weakness   . Osteoporosis   . Alzheimer disease   . Failure to thrive in adult   . Dementia   . CVA (cerebral vascular accident)     speech and language d/o deficits  . Weakness   . Hyperlipemia   . Dysphagia   . Anemia   . PVD (peripheral vascular disease)   . COPD (chronic obstructive pulmonary disease)   . Glaucoma (increased eye pressure)   . Hypertension   . S/P BKA (below knee amputation) unilateral 01/21/2006    left  . Renal artery stenosis    Current Outpatient Prescriptions on File Prior to Visit  Medication Sig Dispense Refill  . amLODipine (NORVASC) 10 MG tablet Take 10 mg by mouth daily.      Marland Kitchen. aspirin 81 MG chewable tablet Chew 81 mg by mouth daily.      . brimonidine (ALPHAGAN P) 0.1 % SOLN Place 1 drop into both eyes 2 (two) times daily.      . calcium-vitamin D (OSCAL WITH D) 500-200 MG-UNIT per tablet Take 1 tablet by mouth 2 (two) times daily.      . clopidogrel (PLAVIX) 75 MG tablet Take 75 mg by mouth daily.      Marland Kitchen. denosumab (PROLIA) 60 MG/ML SOLN injection Inject 60 mg into the skin every 6 (six) months. Next dose due April 2014      . desmopressin (DDAVP) 0.01 % SOLN Place 1 spray into the nose 3 (three) times daily.       Marland Kitchen. donepezil (ARICEPT) 10 MG tablet Take 10 mg by mouth at bedtime as needed.      . dorzolamide (TRUSOPT) 2 % ophthalmic solution Place 1 drop into both eyes 2 (two) times daily.      Marland Kitchen. gabapentin (NEURONTIN) 100 MG capsule Take 100 mg by mouth 2 (two) times daily.      Marland Kitchen. HYDROcodone-acetaminophen (NORCO) 5-325 MG per tablet Take one tablet by mouth four times daily for pain  120 tablet  0  . hydroxypropyl methylcellulose (ISOPTO TEARS) 2.5 % ophthalmic solution Place 1 drop into both eyes 3 (three) times daily.      . magnesium hydroxide (MILK OF MAGNESIA) 400 MG/5ML suspension Take 30 mLs by mouth daily as needed. For constipation      . Memantine HCl ER (NAMENDA XR) 28 MG CP24 Take 28 mg by mouth daily.  30 capsule  99  . methocarbamol (ROBAXIN) 500 MG tablet Take 500 mg by mouth 2 (two) times daily as needed for muscle spasms.      . Multiple Vitamin (MULTIVITAMIN WITH MINERALS) TABS Take 1 tablet by mouth daily.      . nitroGLYCERIN (NITRODUR - DOSED IN MG/24 HR) 0.2 mg/hr Place 1 patch onto the  skin daily.      . polyethylene glycol (MIRALAX / GLYCOLAX) packet Take 17 g by mouth daily.      . Travoprost, BAK Free, (TRAVATAN) 0.004 % SOLN ophthalmic solution Place 1 drop into both eyes at bedtime.      . traZODone (DESYREL) 50 MG tablet Take 25 mg by mouth at bedtime.       No current facility-administered medications on file prior to visit.   Review of Systems; Respiratory; no cough no sputum Cardiac; no chest pain no exertional chest  Physical examination Gen. patient is in no distress Vitals pulse is 62 and regular respirations 18 Respiratory clear entry bilaterally no crackles or wheezes Cardiac heart sounds are normal there is no S4  Impression/plan #1 history of coronary artery disease with a history of peripheral vascular disease, cerebrovascular disease and renal artery stenosis. The patient is on aspirin and Plavix. #2 sinus bradycardia; certainly a heart rate into the 40s  has some degree of concern although he appears to be is asymptomatic as far as I am aware. Aricept has been associated with AV block and bradycardia. I will reduce the dose to 5 mg. In sitter discontinuing this if he continues to dip below 50. He is not on any of the usual medication culprits for bradycardia. He does need his electrolytes checked because of this reason. #3 chronic renal insufficiency last checked in June this will need to be rechecked. Especially his potassium level.

## 2014-01-30 ENCOUNTER — Emergency Department (HOSPITAL_COMMUNITY)
Admission: EM | Admit: 2014-01-30 | Discharge: 2014-01-31 | Disposition: A | Discharge status: Home / Self Care | Payer: Medicare Other | Attending: Emergency Medicine | Admitting: Emergency Medicine

## 2014-01-30 ENCOUNTER — Encounter (HOSPITAL_COMMUNITY): Payer: Self-pay | Admitting: Emergency Medicine

## 2014-01-30 ENCOUNTER — Emergency Department (HOSPITAL_COMMUNITY): Payer: Medicare Other

## 2014-01-30 DIAGNOSIS — R011 Cardiac murmur, unspecified: Secondary | ICD-10-CM | POA: Insufficient documentation

## 2014-01-30 DIAGNOSIS — R531 Weakness: Secondary | ICD-10-CM

## 2014-01-30 DIAGNOSIS — I498 Other specified cardiac arrhythmias: Secondary | ICD-10-CM | POA: Insufficient documentation

## 2014-01-30 DIAGNOSIS — Z8673 Personal history of transient ischemic attack (TIA), and cerebral infarction without residual deficits: Secondary | ICD-10-CM | POA: Insufficient documentation

## 2014-01-30 DIAGNOSIS — Z87891 Personal history of nicotine dependence: Secondary | ICD-10-CM | POA: Insufficient documentation

## 2014-01-30 DIAGNOSIS — Z7902 Long term (current) use of antithrombotics/antiplatelets: Secondary | ICD-10-CM | POA: Insufficient documentation

## 2014-01-30 DIAGNOSIS — J449 Chronic obstructive pulmonary disease, unspecified: Secondary | ICD-10-CM | POA: Insufficient documentation

## 2014-01-30 DIAGNOSIS — E875 Hyperkalemia: Secondary | ICD-10-CM | POA: Insufficient documentation

## 2014-01-30 DIAGNOSIS — Z862 Personal history of diseases of the blood and blood-forming organs and certain disorders involving the immune mechanism: Secondary | ICD-10-CM | POA: Insufficient documentation

## 2014-01-30 DIAGNOSIS — S88119A Complete traumatic amputation at level between knee and ankle, unspecified lower leg, initial encounter: Secondary | ICD-10-CM | POA: Insufficient documentation

## 2014-01-30 DIAGNOSIS — G309 Alzheimer's disease, unspecified: Secondary | ICD-10-CM | POA: Insufficient documentation

## 2014-01-30 DIAGNOSIS — Z7982 Long term (current) use of aspirin: Secondary | ICD-10-CM | POA: Insufficient documentation

## 2014-01-30 DIAGNOSIS — H269 Unspecified cataract: Secondary | ICD-10-CM | POA: Insufficient documentation

## 2014-01-30 DIAGNOSIS — H409 Unspecified glaucoma: Secondary | ICD-10-CM | POA: Insufficient documentation

## 2014-01-30 DIAGNOSIS — J4489 Other specified chronic obstructive pulmonary disease: Secondary | ICD-10-CM | POA: Insufficient documentation

## 2014-01-30 DIAGNOSIS — T68XXXA Hypothermia, initial encounter: Secondary | ICD-10-CM

## 2014-01-30 DIAGNOSIS — E870 Hyperosmolality and hypernatremia: Secondary | ICD-10-CM | POA: Insufficient documentation

## 2014-01-30 DIAGNOSIS — R680 Hypothermia, not associated with low environmental temperature: Secondary | ICD-10-CM | POA: Insufficient documentation

## 2014-01-30 DIAGNOSIS — F0280 Dementia in other diseases classified elsewhere without behavioral disturbance: Secondary | ICD-10-CM | POA: Insufficient documentation

## 2014-01-30 DIAGNOSIS — M810 Age-related osteoporosis without current pathological fracture: Secondary | ICD-10-CM | POA: Insufficient documentation

## 2014-01-30 DIAGNOSIS — Z79899 Other long term (current) drug therapy: Secondary | ICD-10-CM | POA: Insufficient documentation

## 2014-01-30 DIAGNOSIS — E785 Hyperlipidemia, unspecified: Secondary | ICD-10-CM | POA: Insufficient documentation

## 2014-01-30 LAB — CBC
HCT: 32.9 % — ABNORMAL LOW (ref 39.0–52.0)
Hemoglobin: 10.6 g/dL — ABNORMAL LOW (ref 13.0–17.0)
MCH: 28 pg (ref 26.0–34.0)
MCHC: 32.2 g/dL (ref 30.0–36.0)
MCV: 87 fL (ref 78.0–100.0)
Platelets: 198 10*3/uL (ref 150–400)
RBC: 3.78 MIL/uL — AB (ref 4.22–5.81)
RDW: 16.6 % — ABNORMAL HIGH (ref 11.5–15.5)
WBC: 6.3 10*3/uL (ref 4.0–10.5)

## 2014-01-30 LAB — DIFFERENTIAL
Basophils Absolute: 0 10*3/uL (ref 0.0–0.1)
Basophils Relative: 1 % (ref 0–1)
Eosinophils Absolute: 0.2 10*3/uL (ref 0.0–0.7)
Eosinophils Relative: 3 % (ref 0–5)
LYMPHS ABS: 2 10*3/uL (ref 0.7–4.0)
Lymphocytes Relative: 31 % (ref 12–46)
MONOS PCT: 12 % (ref 3–12)
Monocytes Absolute: 0.8 10*3/uL (ref 0.1–1.0)
NEUTROS ABS: 3.4 10*3/uL (ref 1.7–7.7)
Neutrophils Relative %: 53 % (ref 43–77)

## 2014-01-30 LAB — I-STAT CHEM 8, ED
BUN: 45 mg/dL — ABNORMAL HIGH (ref 6–23)
CALCIUM ION: 1.38 mmol/L — AB (ref 1.13–1.30)
Chloride: 113 mEq/L — ABNORMAL HIGH (ref 96–112)
Creatinine, Ser: 1.9 mg/dL — ABNORMAL HIGH (ref 0.50–1.35)
GLUCOSE: 88 mg/dL (ref 70–99)
HCT: 34 % — ABNORMAL LOW (ref 39.0–52.0)
HEMOGLOBIN: 11.6 g/dL — AB (ref 13.0–17.0)
Potassium: 5.7 mEq/L — ABNORMAL HIGH (ref 3.7–5.3)
Sodium: 151 mEq/L — ABNORMAL HIGH (ref 137–147)
TCO2: 26 mmol/L (ref 0–100)

## 2014-01-30 LAB — I-STAT TROPONIN, ED: Troponin i, poc: 0.01 ng/mL (ref 0.00–0.08)

## 2014-01-30 LAB — APTT: APTT: 32 s (ref 24–37)

## 2014-01-30 LAB — CBG MONITORING, ED: GLUCOSE-CAPILLARY: 84 mg/dL (ref 70–99)

## 2014-01-30 LAB — PROTIME-INR
INR: 1.01 (ref 0.00–1.49)
Prothrombin Time: 13.1 seconds (ref 11.6–15.2)

## 2014-01-30 NOTE — ED Provider Notes (Signed)
CSN: [id number]     Arrival date & time 01/30/14  2321 History   First MD Initiated Contact with Patient 01/30/14 2334     Chief Complaint  Patient presents with  . Code Stroke    An emergency department physician performed an initial assessment on this suspected stroke patient at 2321. (Consider location/radiation/quality/duration/timing/severity/associated sxs/prior Treatment) HPI This patient is an 80 yo demented man who is WC bound secondary to chronic neurologic deficits and left BKA.   He is BIB EMS from the SNF where he resides. EMS activated CODE STROKE. It is unclear why. The patient does not appear to have any focal motor deficits. He is without complaints. I am unable to reach staff from the [facility] where he resides using the number on their letter head of [phone].  Dr. Thad Ranger responded to the CODE STROKE.   Past Medical History  Diagnosis Date  . Dementia   . Muscle weakness   . Osteoporosis   . Alzheimer disease   . Failure to thrive in adult   . Dementia   . CVA (cerebral vascular accident)     speech and language d/o deficits  . Weakness   . Hyperlipemia   . Dysphagia   . Anemia   . PVD (peripheral vascular disease)   . COPD (chronic obstructive pulmonary disease)   . Glaucoma (increased eye pressure)   . Hypertension   . S/P BKA (below knee amputation) unilateral 01/21/2006    left  . Renal artery stenosis    Past Surgical History  Procedure Laterality Date  . Leg amputation below knee      left   No family history on file. History  Substance Use Topics  . Smoking status: Former Games developer  . Smokeless tobacco: Not on file  . Alcohol Use: No    Review of Systems LIMITED ROS OBTAINED DUE TO DEMENTIA.  Denies pain, sob, chest pain, abd pain, N/V/D, GU sx. Says he is chronically blind and nonambulatory. Denies focal weakness.     Allergies  Review of patient's allergies indicates no known allergies.  Home Medications   Current  Outpatient Rx  Name  Route  Sig  Dispense  Refill  . amLODipine (NORVASC) 10 MG tablet   Oral   Take 10 mg by mouth daily.         Marland Kitchen aspirin 81 MG chewable tablet   Oral   Chew 81 mg by mouth daily.         . brimonidine (ALPHAGAN P) 0.1 % SOLN   Both Eyes   Place 1 drop into both eyes 2 (two) times daily.         . calcium-vitamin D (OSCAL WITH D) 500-200 MG-UNIT per tablet   Oral   Take 1 tablet by mouth 2 (two) times daily.         . clopidogrel (PLAVIX) 75 MG tablet   Oral   Take 75 mg by mouth daily.         Marland Kitchen denosumab (PROLIA) 60 MG/ML SOLN injection   Subcutaneous   Inject 60 mg into the skin every 6 (six) months. Next dose due April 2014         . desmopressin (DDAVP) 0.01 % SOLN   Nasal   Place 1 spray into the nose 3 (three) times daily.         Marland Kitchen donepezil (ARICEPT) 5 MG tablet   Oral   Take 5 mg by mouth at bedtime.         Marland Kitchen  dorzolamide (TRUSOPT) 2 % ophthalmic solution   Both Eyes   Place 1 drop into both eyes 2 (two) times daily.         Marland Kitchen. gabapentin (NEURONTIN) 100 MG capsule   Oral   Take 100 mg by mouth 2 (two) times daily.         Marland Kitchen. HYDROcodone-acetaminophen (NORCO) 5-325 MG per tablet      Take one tablet by mouth four times daily for pain   120 tablet   0   . hydroxypropyl methylcellulose (ISOPTO TEARS) 2.5 % ophthalmic solution   Both Eyes   Place 1 drop into both eyes 3 (three) times daily.         . magnesium hydroxide (MILK OF MAGNESIA) 400 MG/5ML suspension   Oral   Take 30 mLs by mouth daily as needed. For constipation         . Memantine HCl ER (NAMENDA XR) 28 MG CP24   Oral   Take 28 mg by mouth daily.   30 capsule   99   . methocarbamol (ROBAXIN) 500 MG tablet   Oral   Take 500 mg by mouth 2 (two) times daily as needed for muscle spasms.         . Multiple Vitamin (MULTIVITAMIN WITH MINERALS) TABS   Oral   Take 1 tablet by mouth daily.         . nitroGLYCERIN (NITRODUR - DOSED IN MG/24 HR)  0.2 mg/hr   Transdermal   Place 1 patch onto the skin daily.         . polyethylene glycol (MIRALAX / GLYCOLAX) packet   Oral   Take 17 g by mouth daily.         . Travoprost, BAK Free, (TRAVATAN) 0.004 % SOLN ophthalmic solution   Both Eyes   Place 1 drop into both eyes at bedtime.         . traZODone (DESYREL) 50 MG tablet   Oral   Take 25 mg by mouth at bedtime.          BP 186/71  Pulse 45  Resp 9  SpO2 100% Physical Exam Gen: well developed and well nourished appearing, chronically ill appearing Head: NCAT Eyes: PERL, severe bilateral cataracts Nose: no epistaixis or rhinorrhea Mouth/throat: mucosa is moist and pink, edentulous mouth Neck: supple, no stridor Lungs: CTA B, no wheezing, rhonchi or rales CV: bradycardic and regular,2/6 murmur, extremities appear well perfused.  Abd: soft, notender, nondistended Back: no ttp, no cva ttp Skin: warm and dry Ext: s/p left bka, otherwise normal to inspection, no dependent edema Neuro: CN ii-xii grossly intact, no focal deficits Psyche; flat affect  ED Course  Procedures (including critical care time) Labs Review  Results for orders placed during the hospital encounter of 01/30/14 (from the past 24 hour(s))  PROTIME-INR     Status: None   Collection Time    01/30/14 11:24 PM      Result Value Ref Range   Prothrombin Time 13.1  11.6 - 15.2 seconds   INR 1.01  0.00 - 1.49  APTT     Status: None   Collection Time    01/30/14 11:24 PM      Result Value Ref Range   aPTT 32  24 - 37 seconds  CBC     Status: Abnormal   Collection Time    01/30/14 11:24 PM      Result Value Ref Range   WBC 6.3  4.0 -  10.5 K/uL   RBC 3.78 (*) 4.22 - 5.81 MIL/uL   Hemoglobin 10.6 (*) 13.0 - 17.0 g/dL   HCT 16.1 (*) 09.6 - 04.5 %   MCV 87.0  78.0 - 100.0 fL   MCH 28.0  26.0 - 34.0 pg   MCHC 32.2  30.0 - 36.0 g/dL   RDW 40.9 (*) 81.1 - 91.4 %   Platelets 198  150 - 400 K/uL  DIFFERENTIAL     Status: None   Collection Time     01/30/14 11:24 PM      Result Value Ref Range   Neutrophils Relative % 53  43 - 77 %   Neutro Abs 3.4  1.7 - 7.7 K/uL   Lymphocytes Relative 31  12 - 46 %   Lymphs Abs 2.0  0.7 - 4.0 K/uL   Monocytes Relative 12  3 - 12 %   Monocytes Absolute 0.8  0.1 - 1.0 K/uL   Eosinophils Relative 3  0 - 5 %   Eosinophils Absolute 0.2  0.0 - 0.7 K/uL   Basophils Relative 1  0 - 1 %   Basophils Absolute 0.0  0.0 - 0.1 K/uL  I-STAT TROPOININ, ED     Status: None   Collection Time    01/30/14 11:27 PM      Result Value Ref Range   Troponin i, poc 0.01  0.00 - 0.08 ng/mL   Comment 3           I-STAT CHEM 8, ED     Status: Abnormal   Collection Time    01/30/14 11:29 PM      Result Value Ref Range   Sodium 151 (*) 137 - 147 mEq/L   Potassium 5.7 (*) 3.7 - 5.3 mEq/L   Chloride 113 (*) 96 - 112 mEq/L   BUN 45 (*) 6 - 23 mg/dL   Creatinine, Ser 7.82 (*) 0.50 - 1.35 mg/dL   Glucose, Bld 88  70 - 99 mg/dL   Calcium, Ion 9.56 (*) 1.13 - 1.30 mmol/L   TCO2 26  0 - 100 mmol/L   Hemoglobin 11.6 (*) 13.0 - 17.0 g/dL   HCT 21.3 (*) 08.6 - 57.8 %  CBG MONITORING, ED     Status: None   Collection Time    01/30/14 11:43 PM      Result Value Ref Range   Glucose-Capillary 84  70 - 99 mg/dL     Imaging Review Ct Head (brain) Wo Contrast  01/30/2014   CLINICAL DATA:  Right facial droop and weakness.  Code stroke.  EXAM: CT HEAD WITHOUT CONTRAST  TECHNIQUE: Contiguous axial images were obtained from the base of the skull through the vertex without intravenous contrast.  COMPARISON:  06/25/2012  FINDINGS: There is no evidence of acute cortical infarct, intracranial hemorrhage, midline shift, or extra-axial fluid collection. Advanced cerebral atrophy is unchanged. Periventricular white matter hypodensities do not appear significantly changed and are compatible with moderate chronic small vessel ischemic disease. Prominent calcification at the vertex left of midline is unchanged. The visualized paranasal sinuses  and mastoid air cells are clear. Atherosclerotic intracranial vertebral and carotid siphon calcifications are noted. Postsurgical changes are noted at the left lobe.  IMPRESSION: 1. No evidence of acute intracranial abnormality. 2. Unchanged atrophy and chronic small vessel ischemic disease. These results were called by telephone at the time of interpretation on 01/30/2014 at 11:39 PM to Dr. Thad Ranger, who verbally acknowledged these results.   Electronically Signed  By: Sebastian Ache   On: 01/30/2014 23:39    EKG: sinus brady, LVH, normal axis, early repol pattern, no STEMI, no acute ischemic changes, normal ST T segments.  MDM   No focal deficits appreciated. Dr. Thad Ranger and I are in agreement that there is no indication for acute intervention or admission based on clinical findings. Labs were sent indenpendently of me as part of STROKE CODE.   0330:  Patient was hypothermic on arrival. This may have been secondary to or potentiated by cold exposure en route. ED work up is otherwise notable for mild hypernatremia and mild hyperkalemia. Patient's MAR does not include any potassium supplementation. We have successfully treated with bare hugger and warmed NS and will tx with single 30 gram dose of Kayexelate. I do not recognize any benefit to admission of this hospital. We will d/c back to SNF.   Brandt Loosen, MD 01/31/14 [phone]

## 2014-01-30 NOTE — ED Notes (Addendum)
Per ems- pt from [facility]. LSN 2100 per staff. Pt with R sided facial droop and weakness to R arm. Pt denies headache. Pt is a&o per norm. Code stroke initiated enroute 2301. cbg 90.

## 2014-01-31 ENCOUNTER — Emergency Department (HOSPITAL_COMMUNITY): Payer: Medicare Other

## 2014-01-31 DIAGNOSIS — R5383 Other fatigue: Secondary | ICD-10-CM

## 2014-01-31 DIAGNOSIS — R5381 Other malaise: Secondary | ICD-10-CM

## 2014-01-31 LAB — URINE MICROSCOPIC-ADD ON

## 2014-01-31 LAB — COMPREHENSIVE METABOLIC PANEL
ALK PHOS: 80 U/L (ref 39–117)
ALT: 13 U/L (ref 0–53)
AST: 17 U/L (ref 0–37)
Albumin: 3 g/dL — ABNORMAL LOW (ref 3.5–5.2)
BUN: 46 mg/dL — AB (ref 6–23)
CHLORIDE: 114 meq/L — AB (ref 96–112)
CO2: 26 meq/L (ref 19–32)
Calcium: 9.4 mg/dL (ref 8.4–10.5)
Creatinine, Ser: 1.8 mg/dL — ABNORMAL HIGH (ref 0.50–1.35)
GFR calc non Af Amer: 32 mL/min — ABNORMAL LOW (ref >90)
GFR, EST AFRICAN AMERICAN: 37 mL/min — AB (ref >90)
GLUCOSE: 87 mg/dL (ref 70–99)
POTASSIUM: 5.7 meq/L — AB (ref 3.7–5.3)
Sodium: 147 mEq/L (ref 137–147)
Total Protein: 6.5 g/dL (ref 6.0–8.3)

## 2014-01-31 LAB — URINALYSIS, ROUTINE W REFLEX MICROSCOPIC
BILIRUBIN URINE: NEGATIVE
Glucose, UA: NEGATIVE mg/dL
HGB URINE DIPSTICK: NEGATIVE
KETONES UR: NEGATIVE mg/dL
NITRITE: NEGATIVE
PH: 7.5 (ref 5.0–8.0)
Protein, ur: 30 mg/dL — AB
Specific Gravity, Urine: 1.013 (ref 1.005–1.030)
UROBILINOGEN UA: 0.2 mg/dL (ref 0.0–1.0)

## 2014-01-31 LAB — CBG MONITORING, ED: GLUCOSE-CAPILLARY: 86 mg/dL (ref 70–99)

## 2014-01-31 MED ORDER — SODIUM CHLORIDE 0.9 % IV BOLUS (SEPSIS)
1000.0000 mL | Freq: Once | INTRAVENOUS | Status: AC
  Administered 2014-01-31: 1000 mL via INTRAVENOUS

## 2014-01-31 MED ORDER — SODIUM POLYSTYRENE SULFONATE 15 GM/60ML PO SUSP
30.0000 g | Freq: Once | ORAL | Status: AC
  Administered 2014-01-31: 30 g via ORAL
  Filled 2014-01-31 (×2): qty 120

## 2014-01-31 NOTE — Consult Note (Signed)
Referring Physician: Lavella Lemons    Chief Complaint: Right sided weakness  HPI: Joseph Ponce is an 80 y.o. male who is a SNF resident.  Patient was last seen at baseline by staff per EMS at 2100.  When checked on later the patient was felt to have a facial droop and right sided weakness.  EMS was called at that time and the pateint was brought in as a code stroke.   The patient has a history of dementia and a left BKA.  Baseline functioning is unclear.  SNF unable to be reached for more information.  There is no family that is in the state.   Initial NIHSS of 4.    Date last known well: Date: 01/30/2014 Time last known well: Time: 21:00 tPA Given: No: Symptoms improving  Past Medical History  Diagnosis Date  . Dementia   . Muscle weakness   . Osteoporosis   . Alzheimer disease   . Failure to thrive in adult   . Dementia   . CVA (cerebral vascular accident)     speech and language d/o deficits  . Weakness   . Hyperlipemia   . Dysphagia   . Anemia   . PVD (peripheral vascular disease)   . COPD (chronic obstructive pulmonary disease)   . Glaucoma (increased eye pressure)   . Hypertension   . S/P BKA (below knee amputation) unilateral 01/21/2006    left  . Renal artery stenosis     Past Surgical History  Procedure Laterality Date  . Leg amputation below knee      left    Family history: Unable to obtain secondary to dementia.  Social History:  reports that he has quit smoking. He does not have any smokeless tobacco history on file. He reports that he does not drink alcohol or use illicit drugs.  Allergies: No Known Allergies  Medications: I have reviewed the patient's current medications. Prior to Admission:  No current facility-administered medications for this encounter. Current outpatient prescriptions: Amino Acids-Protein Hydrolys (FEEDING SUPPLEMENT, PRO-STAT SUGAR FREE 64,) LIQD, Take 30 mLs by mouth 2 (two) times daily., Disp: , Rfl: ;   amLODipine (NORVASC) 10 MG  tablet, Take 10 mg by mouth daily., Disp: , Rfl: ;   aspirin 81 MG chewable tablet, Chew 81 mg by mouth daily., Disp: , Rfl: ;   brimonidine (ALPHAGAN P) 0.1 % SOLN, Place 1 drop into both eyes 2 (two) times daily., Disp: , Rfl:  calcium-vitamin D (OSCAL WITH D) 500-200 MG-UNIT per tablet, Take 1 tablet by mouth 2 (two) times daily., Disp: , Rfl: ;   clopidogrel (PLAVIX) 75 MG tablet, Take 75 mg by mouth daily., Disp: , Rfl: ;   desmopressin (DDAVP) 0.01 % SOLN, Place 1 spray into the nose 3 (three) times daily., Disp: , Rfl: ;   donepezil (ARICEPT) 5 MG tablet, Take 5 mg by mouth at bedtime., Disp: , Rfl:  dorzolamide (TRUSOPT) 2 % ophthalmic solution, Place 1 drop into both eyes 2 (two) times daily., Disp: , Rfl: ;   feeding supplement, RESOURCE BREEZE, (RESOURCE BREEZE) LIQD, Take 1 Container by mouth 2 (two) times daily between meals., Disp: , gabapentin (NEURONTIN) 100 MG capsule, Take 100 mg by mouth 2 (two) times daily., Disp: , Rfl:  HYDROcodone-acetaminophen (NORCO/VICODIN) 5-325 MG per tablet, Take 1 tablet by mouth 4 (four) times daily., Disp: , Rfl: ;   hydroxypropyl methylcellulose (ISOPTO TEARS) 2.5 % ophthalmic solution, Place 1 drop into both eyes 3 (three) times daily., Disp: ,  Rfl: ;   Memantine HCl ER (NAMENDA XR) 28 MG CP24, Take 28 mg by mouth daily., Disp: 30 capsule, Rfl: 99 Multiple Vitamin (MULTIVITAMIN WITH MINERALS) TABS, Take 1 tablet by mouth daily., Disp: , Rfl: ;   nitroGLYCERIN (NITRODUR - DOSED IN MG/24 HR) 0.2 mg/hr, Place 1 patch onto the skin daily., Disp: , Rfl: ;   polyethylene glycol (MIRALAX / GLYCOLAX) packet, Take 17 g by mouth daily., Disp: , Rfl: ;   Travoprost, BAK Free, (TRAVATAN) 0.004 % SOLN ophthalmic solution, Place 1 drop into both eyes at bedtime., Disp: , Rfl:  denosumab (PROLIA) 60 MG/ML SOLN injection, Inject 60 mg into the skin every 6 (six) months. Next dose due April 2014, Disp: , Rfl: ;   magnesium hydroxide (MILK OF MAGNESIA) 400 MG/5ML  suspension, Take 30 mLs by mouth daily as needed. For constipation, Disp: , Rfl:   ROS: History obtained from the patient  General ROS: negative for - chills, fatigue, fever, night sweats, weight gain or weight loss Psychological ROS: negative for - behavioral disorder, hallucinations, memory difficulties, mood swings or suicidal ideation Ophthalmic ROS:  poor vision ENT ROS: edentulous Allergy and Immunology ROS: negative for - hives or itchy/watery eyes Hematological and Lymphatic ROS: negative for - bleeding problems, bruising or swollen lymph nodes Endocrine ROS: negative for - galactorrhea, hair pattern changes, polydipsia/polyuria or temperature intolerance Respiratory ROS: negative for - cough, hemoptysis, shortness of breath or wheezing Cardiovascular ROS: negative for - chest pain, dyspnea on exertion, edema or irregular heartbeat Gastrointestinal ROS: negative for - abdominal pain, diarrhea, hematemesis, nausea/vomiting or stool incontinence Genito-Urinary ROS: negative for - dysuria, hematuria, incontinence or urinary frequency/urgency Musculoskeletal ROS: negative for - joint swelling or muscular weakness Neurological ROS: as noted in HPI Dermatological ROS: negative for rash and skin lesion changes  Physical Examination: Blood pressure 186/71, pulse 45, resp. rate 9, SpO2 100.00%.  Neurologic Examination: Mental Status: Alert.  Oriented to month but not to age or place.  Unable to tell the year.  Speech fluent but dysarthric (patient without dentures).  Able to follow simple  commands without difficulty. Able to repeat.   Cranial Nerves: II: Discs flat bilaterally; Unable to count fingers.  Right pupil round and poorly reactive.  Left pupil irregular and unreactive.   III,IV, VI: ptosis not present, extra-ocular motions intact bilaterally V,VII: smile symmetric, facial light touch sensation normal bilaterally VIII: hearing normal bilaterally IX,X: gag reflex present XI:  bilateral shoulder shrug XII: midline tongue extension Motor: Right : Upper extremity   5/5       Left:     Upper extremity   5/5  Lower extremity   3+/5 proximally, 5/5 distal     Lower extremity   Able to lift stump off the bed Tone and bulk:Significant intrinsic hand atrophy noted in the LUE with clawing Sensory: Pinprick and light touch intact throughout, bilaterally Deep Tendon Reflexes: 2+ in the upper extremities and absent in the lower extremities Plantars: Right: mute   Left: BKA Cerebellar: normal finger-to-nose bilaterally Gait: Unable to test CV: pulses palpable throughout     Laboratory Studies:  Basic Metabolic Panel:  Recent Labs Lab 01/30/14 2329  NA 151*  K 5.7*  CL 113*  GLUCOSE 88  BUN 45*  CREATININE 1.90*    Liver Function Tests: No results found for this basename: AST, ALT, ALKPHOS, BILITOT, PROT, ALBUMIN,  in the last 168 hours No results found for this basename: LIPASE, AMYLASE,  in the last 168  hours No results found for this basename: AMMONIA,  in the last 168 hours  CBC:  Recent Labs Lab 01/30/14 2324 01/30/14 2329  WBC 6.3  --   NEUTROABS 3.4  --   HGB 10.6* 11.6*  HCT 32.9* 34.0*  MCV 87.0  --   PLT 198  --     Cardiac Enzymes: No results found for this basename: CKTOTAL, CKMB, CKMBINDEX, TROPONINI,  in the last 168 hours  BNP: No components found with this basename: POCBNP,   CBG:  Recent Labs Lab 01/30/14 2343  GLUCAP 84    Microbiology: Results for orders placed during the hospital encounter of 06/24/12  URINE CULTURE     Status: None   Collection Time    06/25/12  1:56 AM      Result Value Ref Range Status   Specimen Description URINE, CLEAN CATCH   Final   Special Requests NONE   Final   Culture  Setup Time 06/25/2012 11:06   Final   Colony Count >=100,000 COLONIES/ML   Final   Culture ESCHERICHIA COLI   Final   Report Status 06/27/2012 FINAL   Final   Organism ID, Bacteria ESCHERICHIA COLI   Final   CULTURE, BLOOD (ROUTINE X 2)     Status: None   Collection Time    06/25/12  2:15 AM      Result Value Ref Range Status   Specimen Description BLOOD LEFT ARM   Final   Special Requests BOTTLES DRAWN AEROBIC AND ANAEROBIC 10CC EA   Final   Culture  Setup Time 06/25/2012 11:07   Final   Culture NO GROWTH 5 DAYS   Final   Report Status 07/01/2012 FINAL   Final  CULTURE, BLOOD (ROUTINE X 2)     Status: None   Collection Time    06/25/12  2:15 AM      Result Value Ref Range Status   Specimen Description BLOOD LEFT HAND   Final   Special Requests BOTTLES DRAWN AEROBIC ONLY [facility]   Final   Culture  Setup Time 06/25/2012 11:07   Final   Culture NO GROWTH 5 DAYS   Final   Report Status 07/01/2012 FINAL   Final  MRSA PCR SCREENING     Status: None   Collection Time    06/25/12  4:22 AM      Result Value Ref Range Status   MRSA by PCR NEGATIVE  NEGATIVE Final   Comment:            The GeneXpert MRSA Assay (FDA     approved for NASAL specimens     only), is one component of a     comprehensive MRSA colonization     surveillance program. It is not     intended to diagnose MRSA     infection nor to guide or     monitor treatment for     MRSA infections.    Coagulation Studies:  Recent Labs  01/30/14 2324  LABPROT 13.1  INR 1.01    Urinalysis: No results found for this basename: COLORURINE, APPERANCEUR, LABSPEC, PHURINE, GLUCOSEU, HGBUR, BILIRUBINUR, KETONESUR, PROTEINUR, UROBILINOGEN, NITRITE, LEUKOCYTESUR,  in the last 168 hours  Lipid Panel: No results found for this basename: chol, trig, hdl, cholhdl, vldl, ldlcalc    HgbA1C:  No results found for this basename: HGBA1C    Urine Drug Screen:   No results found for this basename: labopia, cocainscrnur, labbenz, amphetmu, thcu, labbarb    Alcohol Level:  No results found for this basename: ETH,  in the last 168 hours  Other results: EKG: sinus bradycardia at 43 bpm.  Imaging: Ct Head (brain) Wo Contrast  01/30/2014    CLINICAL DATA:  Right facial droop and weakness.  Code stroke.  EXAM: CT HEAD WITHOUT CONTRAST  TECHNIQUE: Contiguous axial images were obtained from the base of the skull through the vertex without intravenous contrast.  COMPARISON:  06/25/2012  FINDINGS: There is no evidence of acute cortical infarct, intracranial hemorrhage, midline shift, or extra-axial fluid collection. Advanced cerebral atrophy is unchanged. Periventricular white matter hypodensities do not appear significantly changed and are compatible with moderate chronic small vessel ischemic disease. Prominent calcification at the vertex left of midline is unchanged. The visualized paranasal sinuses and mastoid air cells are clear. Atherosclerotic intracranial vertebral and carotid siphon calcifications are noted. Postsurgical changes are noted at the left lobe.  IMPRESSION: 1. No evidence of acute intracranial abnormality. 2. Unchanged atrophy and chronic small vessel ischemic disease. These results were called by telephone at the time of interpretation on 01/30/2014 at 11:39 PM to Dr. Thad Ranger, who verbally acknowledged these results.   Electronically Signed   By: Sebastian Ache   On: 01/30/2014 23:39    Assessment: 80 y.o. male presenting with right sided weakness and facial droop that improved by the time of presentation.  Patient is therefore not a tPA candidate.  Unable to reach SNF but it appears that the patient's mRankin is high and it is unclear exactly what was noted at the facility.  Patient is on ASA and Plavix at the facility.  With vision and BKA patient not likely a candidate for more aggressive therapy.  Head CT reviewed and shows no acute changes.     Stroke Risk Factors - hyperlipidemia and hypertension  Plan: 1. Continue ASA and Plavix   Thana Farr, MD [facility] [phone] 01/31/2014, 12:07 AM

## 2014-01-31 NOTE — ED Notes (Signed)
[facility] with [facility] answer.  Charge notified.

## 2014-01-31 NOTE — ED Notes (Signed)
Bear Hugger removed from pt.

## 2014-01-31 NOTE — ED Notes (Signed)
[facility] called for pt to return to [facility].

## 2014-01-31 NOTE — ED Notes (Signed)
[facility]

## 2014-01-31 NOTE — ED Notes (Addendum)
Pt placed on bair hugger

## 2014-02-07 ENCOUNTER — Other Ambulatory Visit: Payer: Self-pay | Admitting: *Deleted

## 2014-02-07 MED ORDER — HYDROCODONE-ACETAMINOPHEN 5-325 MG PO TABS: ORAL_TABLET | ORAL | Status: DC

## 2014-02-07 NOTE — Telephone Encounter (Signed)
[facility] 

## 2014-02-19 ENCOUNTER — Non-Acute Institutional Stay (SKILLED_NURSING_FACILITY): Payer: Medicare Other | Admitting: Internal Medicine

## 2014-02-19 DIAGNOSIS — E870 Hyperosmolality and hypernatremia: Secondary | ICD-10-CM

## 2014-02-22 NOTE — Progress Notes (Signed)
Patient ID: Joseph ReedyJohn Ponce, male   DOB: [birth year], 80 y.o.   MRN: [id number]                   PROGRESS NOTE  DATE:  02/19/2014    FACILITY[facility]    LEVEL OF CARE:   SNF   Acute Visit   CHIEF COMPLAINT:  Altered LOC.    HISTORY OF PRESENT ILLNESS:  I was called on 02/16/2014 to a report that the patient had altered mental status.  The staff were simply not finding him to be responding in the way he normally does.  I note that he was in the ER for a similar complaint on 01/30/2014.  I think he presented as a Code Stroke.  His sodium at the time was 151, potassium 5.7, BUN 45, and a creatinine of 1.9.  A CT scan of the head was done due to right facial droop.  CT scan showed no evidence of an acute cortical infarct, intracranial hemorrhage, midline shift, or extra-axial fluid collection.  Advanced atrophy was unchanged.  Ventricular white matter hypodensities did not appear significantly changed.    I believe the presentation that I was called about on April 11th is similar to what was seen in the ER.  Lab work that I repeated in the facility showed a sodium of 150, a BUN of 41, and a creatinine of 1.55.  His potassium was 4.1.  White count was 5.2, hemoglobin 10.6, normal differential.    With regards to the hypernatremia, he carries a diagnosis of this.  I note that he is also on DDAVP, 1 spray three times daily.   However, I cannot really place when this was started.  He has been in the building since 2005.  If he has diabetes insipidus, that is not on his problem list.  My notes from earlier are now not available.    REVIEW OF SYSTEMS:  Probably not reliable due to dementia.  However, staff report that he ate maybe half of his meal today.  He did drink his milk and juice.    PHYSICAL EXAMINATION:   GENERAL APPEARANCE:  The patient is awake, alert.  Converses easily.   CHEST/RESPIRATORY:  Clear air entry bilaterally.   CARDIOVASCULAR:  CARDIAC:   Heart sounds are normal.  There  are no murmurs.  Perhaps some degree of dehydration, but not that dramatic.    GASTROINTESTINAL:  LIVER/SPLEEN/KIDNEYS:  No liver, no spleen.  No tenderness.   MUSCULOSKELETAL:   EXTREMITIES:   LEFT LOWER EXTREMITY:   Left BKA.    RIGHT LOWER EXTREMITY:  Right leg has PAD, but no open lesions.     ASSESSMENT/PLAN:  Hypernatremia.  The presence of DDAVP on his medication list is really not exactly clear as to why and when this was started.  Looking back through his records, lab work from 06/25/2012 also showed a sodium of 151.  I will check a spot urine for osmolality.  A dilute urine in the face of hypernatremia and dehydration would confirm diabetes insipidus.  I am aware that sometimes DDAVP is used for urinary frequency, although I am doubtful this is the case.    Altered mental status.  He was sent to the ER in March, as noted above.  Again, a similar presentation three days ago.  However, we did not send him out.  I would wonder about seizures, etc.    CPT CODE: [id number]

## 2014-03-01 ENCOUNTER — Emergency Department (HOSPITAL_COMMUNITY)
Admission: EM | Admit: 2014-03-01 | Discharge: 2014-03-01 | Disposition: A | Discharge status: Home / Self Care | Payer: Medicare Other | Attending: Emergency Medicine | Admitting: Emergency Medicine

## 2014-03-01 ENCOUNTER — Encounter (HOSPITAL_COMMUNITY): Payer: Self-pay | Admitting: Emergency Medicine

## 2014-03-01 DIAGNOSIS — N390 Urinary tract infection, site not specified: Secondary | ICD-10-CM | POA: Insufficient documentation

## 2014-03-01 DIAGNOSIS — I498 Other specified cardiac arrhythmias: Secondary | ICD-10-CM | POA: Insufficient documentation

## 2014-03-01 DIAGNOSIS — Z8673 Personal history of transient ischemic attack (TIA), and cerebral infarction without residual deficits: Secondary | ICD-10-CM | POA: Insufficient documentation

## 2014-03-01 DIAGNOSIS — H409 Unspecified glaucoma: Secondary | ICD-10-CM | POA: Insufficient documentation

## 2014-03-01 DIAGNOSIS — Z7982 Long term (current) use of aspirin: Secondary | ICD-10-CM | POA: Insufficient documentation

## 2014-03-01 DIAGNOSIS — E785 Hyperlipidemia, unspecified: Secondary | ICD-10-CM | POA: Insufficient documentation

## 2014-03-01 DIAGNOSIS — F0280 Dementia in other diseases classified elsewhere without behavioral disturbance: Secondary | ICD-10-CM | POA: Insufficient documentation

## 2014-03-01 DIAGNOSIS — I739 Peripheral vascular disease, unspecified: Secondary | ICD-10-CM | POA: Insufficient documentation

## 2014-03-01 DIAGNOSIS — G309 Alzheimer's disease, unspecified: Secondary | ICD-10-CM | POA: Insufficient documentation

## 2014-03-01 DIAGNOSIS — Z862 Personal history of diseases of the blood and blood-forming organs and certain disorders involving the immune mechanism: Secondary | ICD-10-CM | POA: Insufficient documentation

## 2014-03-01 DIAGNOSIS — Z79899 Other long term (current) drug therapy: Secondary | ICD-10-CM | POA: Insufficient documentation

## 2014-03-01 DIAGNOSIS — M810 Age-related osteoporosis without current pathological fracture: Secondary | ICD-10-CM | POA: Insufficient documentation

## 2014-03-01 DIAGNOSIS — J449 Chronic obstructive pulmonary disease, unspecified: Secondary | ICD-10-CM | POA: Insufficient documentation

## 2014-03-01 DIAGNOSIS — J4489 Other specified chronic obstructive pulmonary disease: Secondary | ICD-10-CM | POA: Insufficient documentation

## 2014-03-01 DIAGNOSIS — Z7902 Long term (current) use of antithrombotics/antiplatelets: Secondary | ICD-10-CM | POA: Insufficient documentation

## 2014-03-01 DIAGNOSIS — I10 Essential (primary) hypertension: Secondary | ICD-10-CM | POA: Insufficient documentation

## 2014-03-01 DIAGNOSIS — Z87891 Personal history of nicotine dependence: Secondary | ICD-10-CM | POA: Insufficient documentation

## 2014-03-01 DIAGNOSIS — S88119A Complete traumatic amputation at level between knee and ankle, unspecified lower leg, initial encounter: Secondary | ICD-10-CM | POA: Insufficient documentation

## 2014-03-01 LAB — URINE MICROSCOPIC-ADD ON

## 2014-03-01 LAB — URINALYSIS, ROUTINE W REFLEX MICROSCOPIC
Bilirubin Urine: NEGATIVE
Glucose, UA: NEGATIVE mg/dL
Hgb urine dipstick: NEGATIVE
Ketones, ur: NEGATIVE mg/dL
Nitrite: NEGATIVE
Protein, ur: 30 mg/dL — AB
Specific Gravity, Urine: 1.016 (ref 1.005–1.030)
Urobilinogen, UA: 0.2 mg/dL (ref 0.0–1.0)
pH: 8 (ref 5.0–8.0)

## 2014-03-01 MED ORDER — CEPHALEXIN 250 MG PO CAPS
500.0000 mg | ORAL_CAPSULE | Freq: Once | ORAL | Status: AC
  Administered 2014-03-01: 500 mg via ORAL
  Filled 2014-03-01 (×2): qty 2

## 2014-03-01 MED ORDER — CEPHALEXIN 500 MG PO CAPS: 500.0000 mg | ORAL_CAPSULE | Freq: Four times a day (QID) | ORAL | Status: DC

## 2014-03-01 MED ORDER — AMLODIPINE BESYLATE 10 MG PO TABS
10.0000 mg | ORAL_TABLET | Freq: Once | ORAL | Status: AC
  Administered 2014-03-01: 10 mg via ORAL
  Filled 2014-03-01: qty 1

## 2014-03-01 NOTE — ED Provider Notes (Signed)
CSN: [id number]     Arrival date & time 03/01/14  1153 History   First MD Initiated Contact with Patient 03/01/14 1205     Chief Complaint  Patient presents with  . Hypertension  . Flank Pain  . Bradycardia     (Consider location/radiation/quality/duration/timing/severity/associated sxs/prior Treatment) HPI  80 year old male sent for evaluation of hypertension? Also noted  sinus bradycardia. She denies any orthostatic symptoms. No shortness of breath. No chest pain. Mentation seems fine and she is maintaining adequate blood pressure. She does endorse some crampy right abdominal pain. Gradual onset of couple days ago. Intermittent. No appreciable exacerbating relieving factors. Urine has had a strong odor. No dysuria or hematuria. No fevers or chills. No nausea or vomiting.  Past Medical History  Diagnosis Date  . Dementia   . Muscle weakness   . Osteoporosis   . Alzheimer disease   . Failure to thrive in adult   . Dementia   . CVA (cerebral vascular accident)     speech and language d/o deficits  . Weakness   . Hyperlipemia   . Dysphagia   . Anemia   . PVD (peripheral vascular disease)   . COPD (chronic obstructive pulmonary disease)   . Glaucoma (increased eye pressure)   . Hypertension   . S/P BKA (below knee amputation) unilateral 01/21/2006    left  . Renal artery stenosis    Past Surgical History  Procedure Laterality Date  . Leg amputation below knee      left   History reviewed. No pertinent family history. History  Substance Use Topics  . Smoking status: Former Games developer  . Smokeless tobacco: Not on file  . Alcohol Use: No    Review of Systems  All systems reviewed and negative, other than as noted in HPI.   Allergies  Review of patient's allergies indicates no known allergies.  Home Medications   Prior to Admission medications   Medication Sig Start Date End Date Taking? Authorizing Provider  Amino Acids-Protein Hydrolys (FEEDING SUPPLEMENT,  PRO-STAT SUGAR FREE 64,) LIQD Take 30 mLs by mouth 2 (two) times daily.   Yes Historical Provider, MD  amLODipine (NORVASC) 10 MG tablet Take 10 mg by mouth daily.   Yes Historical Provider, MD  aspirin 81 MG chewable tablet Chew 81 mg by mouth daily.   Yes Historical Provider, MD  brimonidine (ALPHAGAN P) 0.1 % SOLN Place 1 drop into both eyes 2 (two) times daily.   Yes Historical Provider, MD  calcium-vitamin D (OSCAL WITH D) 500-200 MG-UNIT per tablet Take 1 tablet by mouth 2 (two) times daily.   Yes Historical Provider, MD  clopidogrel (PLAVIX) 75 MG tablet Take 75 mg by mouth daily.   Yes Historical Provider, MD  denosumab (PROLIA) 60 MG/ML SOLN injection Inject 60 mg into the skin every 6 (six) months.    Yes Historical Provider, MD  desmopressin (DDAVP) 0.01 % nasal solution Place 10 mcg into the nose 3 (three) times daily.   Yes Historical Provider, MD  donepezil (ARICEPT) 10 MG tablet Take 10 mg by mouth at bedtime.   Yes Historical Provider, MD  dorzolamide (TRUSOPT) 2 % ophthalmic solution Place 1 drop into both eyes 2 (two) times daily.   Yes Historical Provider, MD  feeding supplement, RESOURCE BREEZE, (RESOURCE BREEZE) LIQD Take 1 Container by mouth 2 (two) times daily between meals.   Yes Historical Provider, MD  gabapentin (NEURONTIN) 100 MG capsule Take 100 mg by mouth 2 (two) times daily.  Yes Historical Provider, MD  HYDROcodone-acetaminophen (NORCO/VICODIN) 5-325 MG per tablet Take 1 tablet by mouth 4 (four) times daily as needed for moderate pain.   Yes Historical Provider, MD  hydroxypropyl methylcellulose (ISOPTO TEARS) 2.5 % ophthalmic solution Place 1 drop into both eyes 3 (three) times daily.   Yes Historical Provider, MD  magnesium hydroxide (MILK OF MAGNESIA) 400 MG/5ML suspension Take 30 mLs by mouth daily as needed. For constipation   Yes Historical Provider, MD  Memantine HCl ER (NAMENDA XR) 28 MG CP24 Take 28 mg by mouth daily.   Yes Historical Provider, MD  Multiple  Vitamins-Minerals (CERTA-VITE PO) Take 1 tablet by mouth daily.   Yes Historical Provider, MD  nitroGLYCERIN (NITRODUR - DOSED IN MG/24 HR) 0.2 mg/hr Place 1 patch onto the skin daily.   Yes Historical Provider, MD  polyethylene glycol (MIRALAX / GLYCOLAX) packet Take 17 g by mouth daily.   Yes Historical Provider, MD  Travoprost, BAK Free, (TRAVATAN) 0.004 % SOLN ophthalmic solution Place 1 drop into both eyes at bedtime.   Yes Historical Provider, MD  traZODone (DESYREL) 50 MG tablet Take 25 mg by mouth at bedtime.   Yes Historical Provider, MD   BP 145/68  Pulse 57  Temp(Src) 97.9 F (36.6 C) (Oral)  Resp 15  SpO2 100% Physical Exam  Nursing note and vitals reviewed. Constitutional: He appears well-developed and well-nourished. No distress.  HENT:  Head: Normocephalic and atraumatic.  Eyes: Conjunctivae are normal. Right eye exhibits no discharge. Left eye exhibits no discharge.  Neck: Neck supple.  Cardiovascular: Normal rate, regular rhythm and normal heart sounds.  Exam reveals no gallop and no friction rub.   No murmur heard. Pulmonary/Chest: Effort normal and breath sounds normal. No respiratory distress.  Abdominal: Soft. He exhibits no distension. There is no tenderness.  Genitourinary:  No cva tenderness  Musculoskeletal: He exhibits no edema and no tenderness.  Neurological: He is alert. No cranial nerve deficit. He exhibits normal muscle tone. Coordination normal.  Skin: Skin is warm and dry.  Psychiatric: He has a normal mood and affect. His behavior is normal. Thought content normal.    ED Course  Procedures (including critical care time) Labs Review Labs Reviewed  URINALYSIS, ROUTINE W REFLEX MICROSCOPIC - Abnormal; Notable for the following:    Protein, ur 30 (*)    Leukocytes, UA MODERATE (*)    All other components within normal limits  URINE MICROSCOPIC-ADD ON - Abnormal; Notable for the following:    Bacteria, UA FEW (*)    All other components within  normal limits  URINE CULTURE    Imaging Review No results found.   EKG Interpretation   Date/Time:  Friday March 01 2014 11:58:19 EDT Ventricular Rate:  77 PR Interval:  208 QRS Duration: 99 QT Interval:  377 QTC Calculation: 427 R Axis:   65 Text Interpretation:  Sinus rhythm Ventricular bigeminy Left ventricular  hypertrophy ST elevation, consider anterior injury ED PHYSICIAN  INTERPRETATION AVAILABLE IN [facility] Confirmed by TEST, Record  ([id number]) on 03/03/2014 7:59:37 AM      MDM   Final diagnoses:  UTI (urinary tract infection)    80 year old sent for evaluation of hypertension? Patient has no complaints which I can attribute to this. Did have some sinus bradycardia but denies any orthostatic symptoms and she is maintaining an adequate blood pressure. She does endorse some mild crampy right-sided abdominal pain. Seems benign. Urinalysis is concerning for possible urinary tract infection. Cultures sent.  Started on Keflex. Chest is appropriate for discharge for further outpatient management.   Raeford RazorStephen Anothy Bufano, MD 03/05/14 1622

## 2014-03-01 NOTE — ED Notes (Signed)
Discharge instruction given to pt by Alan RipperAndrew RN

## 2014-03-01 NOTE — Discharge Instructions (Signed)
Urinary Tract Infection °A urinary tract infection (UTI) can occur any place along the urinary tract. The tract includes the kidneys, ureters, bladder, and urethra. A type of germ called bacteria often causes a UTI. UTIs are often helped with antibiotic medicine.  °HOME CARE  °· If given, take antibiotics as told by your doctor. Finish them even if you start to feel better. °· Drink enough fluids to keep your pee (urine) clear or pale yellow. °· Avoid tea, drinks with caffeine, and bubbly (carbonated) drinks. °· Pee often. Avoid holding your pee in for a long time. °· Pee before and after having sex (intercourse). °· Wipe from front to back after you poop (bowel movement) if you are a woman. Use each tissue only once. °GET HELP RIGHT AWAY IF:  °· You have back pain. °· You have lower belly (abdominal) pain. °· You have chills. °· You feel sick to your stomach (nauseous). °· You throw up (vomit). °· Your burning or discomfort with peeing does not go away. °· You have a fever. °· Your symptoms are not better in 3 days. °MAKE SURE YOU:  °· Understand these instructions. °· Will watch your condition. °· Will get help right away if you are not doing well or get worse. °Document Released: 04/12/2008 Document Revised: 07/19/2012 Document Reviewed: 05/25/2012 °[employer]® Patient Information ©2014 [employer]. ° °

## 2014-03-01 NOTE — ED Notes (Signed)
Per EMS: pt from [facility] with c/o hypertension.  Pt's BP 146/72 with EMS.  On arrival to ED BP 179/92 HR 35.  Pt answers questions appropriately and is arousable. Pt reporting some right sided pain with EMS.

## 2014-03-03 LAB — URINE CULTURE: Colony Count: >=100000

## 2014-03-04 ENCOUNTER — Non-Acute Institutional Stay (SKILLED_NURSING_FACILITY): Payer: Medicare Other | Admitting: Nurse Practitioner

## 2014-03-04 DIAGNOSIS — G4700 Insomnia, unspecified: Secondary | ICD-10-CM

## 2014-03-04 DIAGNOSIS — I498 Other specified cardiac arrhythmias: Secondary | ICD-10-CM

## 2014-03-04 DIAGNOSIS — J449 Chronic obstructive pulmonary disease, unspecified: Secondary | ICD-10-CM

## 2014-03-04 DIAGNOSIS — D649 Anemia, unspecified: Secondary | ICD-10-CM

## 2014-03-04 DIAGNOSIS — F0390 Unspecified dementia without behavioral disturbance: Secondary | ICD-10-CM

## 2014-03-04 DIAGNOSIS — K5900 Constipation, unspecified: Secondary | ICD-10-CM

## 2014-03-04 DIAGNOSIS — I10 Essential (primary) hypertension: Secondary | ICD-10-CM

## 2014-03-04 DIAGNOSIS — R001 Bradycardia, unspecified: Secondary | ICD-10-CM

## 2014-03-04 DIAGNOSIS — I2510 Atherosclerotic heart disease of native coronary artery without angina pectoris: Secondary | ICD-10-CM

## 2014-03-04 NOTE — Progress Notes (Signed)
Patient ID: Joseph Ponce, male   DOB: Dec 16, 1923, 80 y.o.   MRN: [id number]    Nursing Home Location:  [place] of Service: SNF (31)  PCP: Cyndee Brightly, MD  No Known Allergies  Chief Complaint  Patient presents with  . Medical Management of Chronic Issues    HPI:  80 year old male who is a LTR of [facility] with a pmh of dementia, OP, FTT, CVA, anemia, hyperlipidemia, PVD and htn. Pt is being seen today for routine follow up on chronic conditions; in the last month pt was sent to the ED due to bradycardia, pt found to have UTI and has been treated with keflex, pt tolerating treatment without adverse effects overall pt LOC and energy has improved but still with episodes of bradycardia.   Review of Systems:  Review of Systems  Constitutional: Negative for fever, chills and malaise/fatigue.  Eyes: Negative.   Respiratory: Negative for cough and shortness of breath.   Cardiovascular: Negative for chest pain and leg swelling.  Gastrointestinal: Negative for heartburn, abdominal pain, diarrhea and constipation.  Genitourinary: Negative for dysuria, urgency and frequency.  Musculoskeletal: Negative for joint pain and myalgias.  Skin: Negative.   Neurological: Negative for dizziness, weakness and headaches.  Psychiatric/Behavioral: Positive for memory loss. Negative for depression. The patient is not nervous/anxious and does not have insomnia.      Past Medical History  Diagnosis Date  . Dementia   . Muscle weakness   . Osteoporosis   . Alzheimer disease   . Failure to thrive in adult   . Dementia   . CVA (cerebral vascular accident)     speech and language d/o deficits  . Weakness   . Hyperlipemia   . Dysphagia   . Anemia   . PVD (peripheral vascular disease)   . COPD (chronic obstructive pulmonary disease)   . Glaucoma (increased eye pressure)   . Hypertension   . S/P BKA (below knee amputation) unilateral 01/21/2006    left  . Renal  artery stenosis    Past Surgical History  Procedure Laterality Date  . Leg amputation below knee      left   Social History:   reports that he has quit smoking. He does not have any smokeless tobacco history on file. He reports that he does not drink alcohol or use illicit drugs.  No family history on file.  Medications: Patient's Medications  New Prescriptions   No medications on file  Previous Medications   AMINO ACIDS-PROTEIN HYDROLYS (FEEDING SUPPLEMENT, PRO-STAT SUGAR FREE 64,) LIQD    Take 30 mLs by mouth 2 (two) times daily.   AMLODIPINE (NORVASC) 10 MG TABLET    Take 10 mg by mouth daily.   ASPIRIN 81 MG CHEWABLE TABLET    Chew 81 mg by mouth daily.   BRIMONIDINE (ALPHAGAN P) 0.1 % SOLN    Place 1 drop into both eyes 2 (two) times daily.   CALCIUM-VITAMIN D (OSCAL WITH D) 500-200 MG-UNIT PER TABLET    Take 1 tablet by mouth 2 (two) times daily.   CEPHALEXIN (KEFLEX) 500 MG CAPSULE    Take 1 capsule (500 mg total) by mouth 4 (four) times daily.   CLOPIDOGREL (PLAVIX) 75 MG TABLET    Take 75 mg by mouth daily.   DENOSUMAB (PROLIA) 60 MG/ML SOLN INJECTION    Inject 60 mg into the skin every 6 (six) months.    DESMOPRESSIN (DDAVP) 0.01 % NASAL SOLUTION  Place 10 mcg into the nose 3 (three) times daily.   DONEPEZIL (ARICEPT) 10 MG TABLET    Take 10 mg by mouth at bedtime.   DORZOLAMIDE (TRUSOPT) 2 % OPHTHALMIC SOLUTION    Place 1 drop into both eyes 2 (two) times daily.   FEEDING SUPPLEMENT, RESOURCE BREEZE, (RESOURCE BREEZE) LIQD    Take 1 Container by mouth 2 (two) times daily between meals.   GABAPENTIN (NEURONTIN) 100 MG CAPSULE    Take 100 mg by mouth 2 (two) times daily.   HYDROCODONE-ACETAMINOPHEN (NORCO/VICODIN) 5-325 MG PER TABLET    Take 1 tablet by mouth 4 (four) times daily as needed for moderate pain.   HYDROXYPROPYL METHYLCELLULOSE (ISOPTO TEARS) 2.5 % OPHTHALMIC SOLUTION    Place 1 drop into both eyes 3 (three) times daily.   MAGNESIUM HYDROXIDE (MILK OF MAGNESIA)  400 MG/5ML SUSPENSION    Take 30 mLs by mouth daily as needed. For constipation   MEMANTINE HCL ER (NAMENDA XR) 28 MG CP24    Take 28 mg by mouth daily.   MULTIPLE VITAMINS-MINERALS (CERTA-VITE PO)    Take 1 tablet by mouth daily.   NITROGLYCERIN (NITRODUR - DOSED IN MG/24 HR) 0.2 MG/HR    Place 1 patch onto the skin daily.   POLYETHYLENE GLYCOL (MIRALAX / GLYCOLAX) PACKET    Take 17 g by mouth daily.   TRAVOPROST, BAK FREE, (TRAVATAN) 0.004 % SOLN OPHTHALMIC SOLUTION    Place 1 drop into both eyes at bedtime.   TRAZODONE (DESYREL) 50 MG TABLET    Take 25 mg by mouth at bedtime.  Modified Medications   No medications on file  Discontinued Medications   No medications on file     Physical Exam:  Filed Vitals:   03/04/14 1159  BP: 162/72  Pulse: 49  Temp: 98.2 F (36.8 C)  Resp: 20  Weight: 167 lb (75.751 kg)   Physical Exam  Vitals reviewed. Constitutional: He is well-developed, well-nourished, and in no distress. No distress.  HENT:  Mouth/Throat: Oropharynx is clear and moist. No oropharyngeal exudate.  Eyes: Conjunctivae and EOM are normal. Pupils are equal, round, and reactive to light.  Neck: Normal range of motion. Neck supple.  Cardiovascular: Normal rate, regular rhythm and normal heart sounds.   Pulmonary/Chest: Effort normal and breath sounds normal. No respiratory distress.  Abdominal: Soft. Bowel sounds are normal. He exhibits no distension.  Musculoskeletal: He exhibits no edema and no tenderness.  Left BKA; self propels in [facility]  Neurological: He is alert.  Skin: Skin is warm and dry. He is not diaphoretic.  Psychiatric: Affect normal.      Labs reviewed: CBC with Diff    Result: 02/16/2014 5:47 PM   ( Status: F )       WBC 5.2     4.0-10.5 K/uL SLN   RBC 3.94   L 4.22-5.81 MIL/uL SLN   Hemoglobin 10.6   L 13.0-17.0 g/dL SLN   Hematocrit 33.1   L 39.0-52.0 % SLN   MCV 84.0     78.0-100.0 fL SLN   MCH 26.9     26.0-34.0 pg SLN   MCHC 32.0      30.0-36.0 g/dL SLN   RDW 16.0   H 11.5-15.5 % SLN   Platelet Count 192     150-400 K/uL SLN   Granulocyte % 69     43-77 % SLN   Absolute Gran 3.6     1.7-7.7 K/uL SLN   Lymph % 20  12-46 % SLN   Absolute Lymph 1.0     0.7-4.0 K/uL SLN   Mono % 7     3-12 % SLN   Absolute Mono 0.4     0.1-1.0 K/uL SLN   Eos % 3     0-5 % SLN   Absolute Eos 0.2     0.0-0.7 K/uL SLN   Baso % 1     0-1 % SLN   Absolute Baso 0.1     0.0-0.1 K/uL SLN   Smear Review Criteria for review not met  SLN   Basic Metabolic Panel    Result: 02/16/2014 7:15 PM   ( Status: F )       Sodium 150   H 135-145 mEq/L SLN   Potassium 4.1     3.5-5.3 mEq/L SLN   Chloride 114   H 96-112 mEq/L SLN   CO2 28     19-32 mEq/L SLN   Glucose 147   H 70-99 mg/dL SLN   BUN 41   H 6-23 mg/dL SLN   Creatinine 1.55   H 0.50-1.35 mg/dL SLN   Calcium 9.6       Basic Metabolic Panel:  Recent Labs  01/30/14 2324 01/30/14 2329  NA 147 151*  K 5.7* 5.7*  CL 114* 113*  CO2 26  --   GLUCOSE 87 88  BUN 46* 45*  CREATININE 1.80* 1.90*  CALCIUM 9.4  --    Liver Function Tests:  Recent Labs  01/30/14 2324  AST 17  ALT 13  ALKPHOS 80  BILITOT <0.2*  PROT 6.5  ALBUMIN 3.0*   No results found for this basename: LIPASE, AMYLASE,  in the last 8760 hours No results found for this basename: AMMONIA,  in the last 8760 hours CBC:  Recent Labs  01/30/14 2324 01/30/14 2329  WBC 6.3  --   NEUTROABS 3.4  --   HGB 10.6* 11.6*  HCT 32.9* 34.0*  MCV 87.0  --   PLT 198  --    CBG:  Recent Labs  01/30/14 2343 01/31/14 0131  GLUCAP 84 86    Assessment/Plan 1. Hypertension -stable on current medications   2. CAD (coronary artery disease) -conts ASA and plavix, without reports of chest pains  3. COPD (chronic obstructive pulmonary disease) -without signs of worsening COPD  4. Unspecified constipation -without complaints  5. Dementia without behavioral disturbance -will dc aricept at this time due to  bradycardia, some progression of dementia noted however pt has been diagnosed with UTI, will cont to monitor -conts on namenda  6. Bradycardia, sinus -conts with bradycardia, will dc aricept at this time and cont to monitor  7. Anemia -will follow up cbc  8. Insomnia -reports increase lethargy and not participating in therapy, was diagnosed with UTI in ED but at this time will  -will stop trazodone at this time  9. UTI -conts on keflex appers to be doing better on exam today, will follow up labs, at this time -bmp and cbc

## 2014-03-05 ENCOUNTER — Telehealth (HOSPITAL_BASED_OUTPATIENT_CLINIC_OR_DEPARTMENT_OTHER): Payer: Self-pay | Admitting: Emergency Medicine

## 2014-03-05 NOTE — Telephone Encounter (Signed)
Post ED Visit - Positive Culture Follow-up  Culture report reviewed by antimicrobial stewardship pharmacist: []  Wes Dulaney, Pharm.D., BCPS []  Celedonio MiyamotoJeremy Frens, Pharm.D., BCPS []  Georgina PillionElizabeth Martin, Pharm.D., BCPS []  MarkMinh Pham, [address] BoulevardPharm.D., BCPS, AAHIVP []  Estella HuskMichelle Turner, Pharm.D., BCPS, AAHIVP [x]  Harvie JuniorNathan Cope, Pharm.D.  Positive urine culture Treated with Keflex, organism sensitive to the same and no further patient follow-up is required at this time.  Zeb ComfortKylie Apolo Cutshaw 03/05/2014, 12:38 PM

## 2014-03-06 ENCOUNTER — Non-Acute Institutional Stay (SKILLED_NURSING_FACILITY): Payer: Medicare Other | Admitting: Internal Medicine

## 2014-03-06 DIAGNOSIS — E870 Hyperosmolality and hypernatremia: Secondary | ICD-10-CM

## 2014-03-06 DIAGNOSIS — I498 Other specified cardiac arrhythmias: Secondary | ICD-10-CM

## 2014-03-06 DIAGNOSIS — R001 Bradycardia, unspecified: Secondary | ICD-10-CM

## 2014-03-08 ENCOUNTER — Other Ambulatory Visit: Payer: Self-pay | Admitting: *Deleted

## 2014-03-08 MED ORDER — HYDROCODONE-ACETAMINOPHEN 5-325 MG PO TABS: 1.0000 | ORAL_TABLET | Freq: Four times a day (QID) | ORAL | Status: DC | PRN

## 2014-03-08 NOTE — Telephone Encounter (Signed)
[facility]

## 2014-03-11 NOTE — Progress Notes (Addendum)
Patient ID: Joseph ReedyJohn Zapien, male   DOB: 1924/03/09, 80 y.o.   MRN: [id number]                  PROGRESS NOTE  DATE:  03/06/2014    [facility]    LEVEL OF CARE:   SNF   Acute Visit   CHIEF COMPLAINT:  Review of trip to the emergency room.     HISTORY OF PRESENT ILLNESS:  Mr. [name] was once again sent to the emergency room on 03/04/2014,  apparently for a pulse rate found to be at 32.  His blood pressure was within the normal limits.  He had been seen two days ago and Aricept and Trazodone were discontinued.  Aricept can cause bradycardia.  I am not completely sure why the Trazodone was stopped.  Lab work has been ordered.    In the ER, no lab work was done.  I do not see a lot of evidence that they were aware of his bradycardia in the facility.  They seemed to get onto a complaint of cramping right upper abdominal pain.  Ultimately, he had a urine culture showing Proteus.  They prescribed Keflex. I am not completely certain, and I will need to verify, that he actually received this here.    In any case, he is back to his normal state.  He is not complaining.    An EKG done in the ER was interpreted as showing a pulse rate of 77, sinus rhythm, ventricular bigeminy, left ventricular hypertrophy, considered anterior injury.  I have reviewed the EKG.  Other than PACs, I really do not see anything ominous here.  Again, the pulse rate was in the 70s.     PHYSICAL EXAMINATION:   VITAL SIGNS:   PULSE:  Pulse rate today is 64 and regular.   RESPIRATIONS:  18.   O2 SATURATIONS:  91% on room air.   CHEST/RESPIRATORY:  Shallow, but otherwise clear air entry.    CARDIOVASCULAR:  CARDIAC:   Heart sounds are normal.  There are no murmurs.   GASTROINTESTINAL:  LIVER/SPLEEN/KIDNEYS:  No liver, no spleen.   ABDOMEN:  No masses.   GENITOURINARY:  BLADDER:   No suprapubic or costovertebral angle tenderness.    ASSESSMENT/PLAN:  Bradycardia.  I do not know whether this was an error in  measurement or a real finding.  He is on Alphagan and Trusopt, although I do not think these have this  side effect.  Certainly, certain ophthalmic drugs have systemic effects, although I do not think the ones he is on do.  He is not on any cardiosuppressing medications, in particular calcium blockers or beta blockers.  His Aracept was discontinued.  I am not precisely sure why.  It might have had something to do with the bradycardia.  I will repeat an EKG on him, but I do not really see anything that was ominous.    Proteus UTI.  Diagnosed in the ER.  He was put on Keflex.    Hypernatremia.  He is on DDAVP.  I have wondered about diabetes insipidus.  I have ordered a urine osmolality on him.  He did not have lab work done in the emergency room.    Currently, the patient seems stable.   He has made an inordinate number of trips to the emergency room in the last 4-5 weeks.

## 2014-03-12 ENCOUNTER — Non-Acute Institutional Stay (SKILLED_NURSING_FACILITY): Payer: Medicare Other | Admitting: Internal Medicine

## 2014-03-12 DIAGNOSIS — E870 Hyperosmolality and hypernatremia: Secondary | ICD-10-CM

## 2014-03-18 NOTE — Progress Notes (Addendum)
Patient ID: Joseph ReedyJohn Ponce, male   DOB: 05-27-1924, 80 y.o.   MRN: [id number]                  PROGRESS NOTE  DATE:  03/12/2014    [facility]    LEVEL OF CARE:   SNF   Acute Visit   CHIEF COMPLAINT:  Follow up hypernatremia.     HISTORY OF PRESENT ILLNESS:  I have been following Joseph Ponce for what  apparently is a diagnosis of diabetes insipidus.  He is on DDAVP, 1 spray three times a day.  I really do not know where this was established, although it seems that he runs sodiums in the 150-152 range.  I did a urine osmolality on him in the presence of DDAVP which shows a urine osmolality of 568, which would suggest that the dose of the DDAVP is not maximal.  According to the aide who is working with him today, he drinks fairly well.    I also followed up on an EKG on him.  This shows sinus bradycardia with a heart rate of 63.  There is no evidence of a heart block, either AV block or bundle branch block  PHYSICAL EXAMINATION:   HEENT:   MOUTH/THROAT:   It appears he has oral thrush on his tongue, either that or his tongue is thickly coated in a white material.  I could not get a look at his oropharynx.   CHEST/RESPIRATORY:  Clear air entry bilaterally.   CARDIOVASCULAR:  CARDIAC:  What appears to be PACs, but no murmurs.  He appears to be euvolemic.    ASSESSMENT/PLAN:  Diabetes insipidus.  I am going to increase his DDAVP to the maximal doses of sprays which will be 2 sprays, alternating nostrils, b.i.d.  Failure to improve things will result in probably moving to pills.  I am a bit uncomfortable with the whole scenario as I really do not see where this was completely worked up.  Nevertheless, this would seem to be the correct approach.

## 2014-03-26 ENCOUNTER — Non-Acute Institutional Stay (SKILLED_NURSING_FACILITY): Payer: Medicare Other | Admitting: Internal Medicine

## 2014-03-26 DIAGNOSIS — E870 Hyperosmolality and hypernatremia: Secondary | ICD-10-CM

## 2014-03-26 DIAGNOSIS — I498 Other specified cardiac arrhythmias: Secondary | ICD-10-CM

## 2014-03-26 DIAGNOSIS — N189 Chronic kidney disease, unspecified: Secondary | ICD-10-CM

## 2014-03-26 DIAGNOSIS — R001 Bradycardia, unspecified: Secondary | ICD-10-CM

## 2014-03-27 NOTE — Progress Notes (Addendum)
Patient ID: Joseph ReedyJohn Ponce, male   DOB: [birth year], 80 y.o.   MRN: [id number]                  PROGRESS NOTE  DATE:  03/26/2014    FACILITY: [facility]    LEVEL OF CARE:   SNF   Acute Visit   CHIEF COMPLAINT:  Follow up hypernatremia, chronic renal insufficiency, bradycardia.     HISTORY OF PRESENT ILLNESS:  I have been following Joseph Ponce fairly frequently after a series of trips to the emergency room.  Among his issues include diabetes insipidus for which he has been on DDAVP.  I had to adjust this due to continued hypernatremia and his sodium on 03/25/2014 was 143, BUN 42, creatinine 1.88.  All of these are improved.    He has also been episodically found to have low pulse rates as low as 32.  I did an EKG on him in the facility.  There was also one done in the emergency room in April.  Both of these show sinus rhythm with PACs.   His EKG in the facility here showed a pulse rate of 63.  There is no oppressive evidence of AV block, bundle branch block, or ischemia, a reasonably normal-looking cardiogram.     REVIEW OF SYSTEMS:   GENERAL:  The patient does not complain of any particular issues.   CHEST/RESPIRATORY:  No cough or shortness of breath.   CARDIAC:   No chest pain.   GI:  No nausea or vomiting.     PHYSICAL EXAMINATION:   VITAL SIGNS:   PULSE:  The pulse rate today is 40, although he is completely asymptomatic.   BLOOD PRESSURE:  Stable at 130/85.   CHEST/RESPIRATORY:  Clear air entry bilaterally.   CARDIOVASCULAR:  CARDIAC:   Heart sounds are normal.  There are no murmurs.    GASTROINTESTINAL:  LIVER/SPLEEN/KIDNEYS:  No liver, no spleen.  No tenderness.    ASSESSMENT/PLAN:  Bradycardia.  I do not see this as something that is currently symptomatic, although the exact issue here is unclear.   I will repeat another EKG on him.  As mentioned in my previous notes, Aricept was discontinued although I cannot see that any of his other medications should be associated  with this.  He is asymptomatic.    Hypernatremia.  I adjusted his dose of DDAVP two weeks ago and this has improved his chronic hypernatremia.  (DI)  Chronic renal insufficiency.   His creatinine at 1.88 is better than last check.  I will follow this episodically.

## 2014-04-02 ENCOUNTER — Non-Acute Institutional Stay (SKILLED_NURSING_FACILITY): Payer: Medicare Other | Admitting: Internal Medicine

## 2014-04-02 DIAGNOSIS — I2510 Atherosclerotic heart disease of native coronary artery without angina pectoris: Secondary | ICD-10-CM

## 2014-04-02 DIAGNOSIS — I498 Other specified cardiac arrhythmias: Secondary | ICD-10-CM

## 2014-04-02 DIAGNOSIS — R001 Bradycardia, unspecified: Secondary | ICD-10-CM

## 2014-04-04 NOTE — Progress Notes (Addendum)
Patient ID: Joseph Ponce, male   DOB: [birth year], 80 y.o.   MRN: [id number]                  PROGRESS NOTE  DATE:  04/02/2014    FAC[name]    LEVEL OF CARE:   SNF   Acute Visit   CHIEF COMPLAINT:  Sinus bradycardia.    HISTORY OF PRESENT ILLNESS:  Once again, I was called last week to a report of otherwise asymptomatic sinus bradycardia with a heart rate in the 30s, specifically 32, at which time the patient's blood pressure was stable and he was complaining of no symptoms.  We have done another EKG on him which shows sinus bradycardia with a ventricular rate of 51.  There is nothing else about this EKG that looks particularly ominous.  In particular, there is no evidence of AV block or intraventricular block.  The patient does not complain about any chest pain, syncope or presyncope.  I note in [facility] that he does carry a diagnosis of coronary artery disease.  I reviewed his EKGs in [facility].  These appear to show left ventricular hypertrophy, but again, no evidence of AV block or intraventricular block.     Once again, today he complains of absolutely nothing.  He does not complain of chest pain, lightheadedness, syncope or presyncope.    CURRENT MEDICATIONS:  Medication list is reviewed.     Amlodipine 10 mg a day.    ASA 81 q.d.    Plavix 75 q.d.    Namenda XR 20 mg daily.    Trusopt and Alphagan ophthalmic.    Calcium.    Gabapentin.    Nitroglycerin patch 0.2 daily.    DDAVP 2 sprays b.i.d.    PHYSICAL EXAMINATION:   VITAL SIGNS:   PULSE:  His pulse rate is 52.  There may be some sinus irregularity.   BLOOD PRESSURE:  Stable at 140/80.   CHEST/RESPIRATORY:  Clear air entry bilaterally.   CARDIOVASCULAR:  CARDIAC:  Other than the bradycardia, no gallops and no murmurs.     ASSESSMENT/PLAN:  Sinus bradycardia.   As far as I am aware, this is completely asymptomatic.  However, he has already made one trip to the emergency room because  of this and I have been called now two times.  I wonder whether this is the beginning of a sinus node dysfunction.  He is not on any pharmacologic agents.   Ordinarily, I might consider sending him to a cardiologist for some form of prolonged monitoring.   However, in this otherwise asymptomatic man, it is difficult to justify this.  Although he does have a history of coronary artery disease in the record, he complains of virtually nothing.  I will check a TSH level with my next blood work on him.  Other than this, I do not see anything that could be contributing to this.  Once I review this will need to have a discussion with his POA wrt Cardiology/prolonged monitoring ????

## 2014-04-29 ENCOUNTER — Other Ambulatory Visit: Payer: Self-pay | Admitting: *Deleted

## 2014-04-29 ENCOUNTER — Non-Acute Institutional Stay (SKILLED_NURSING_FACILITY): Payer: Medicare Other | Admitting: Nurse Practitioner

## 2014-04-29 DIAGNOSIS — R001 Bradycardia, unspecified: Secondary | ICD-10-CM

## 2014-04-29 DIAGNOSIS — F0390 Unspecified dementia without behavioral disturbance: Secondary | ICD-10-CM

## 2014-04-29 DIAGNOSIS — I10 Essential (primary) hypertension: Secondary | ICD-10-CM

## 2014-04-29 DIAGNOSIS — I498 Other specified cardiac arrhythmias: Secondary | ICD-10-CM

## 2014-04-29 DIAGNOSIS — I2510 Atherosclerotic heart disease of native coronary artery without angina pectoris: Secondary | ICD-10-CM

## 2014-04-29 DIAGNOSIS — G894 Chronic pain syndrome: Secondary | ICD-10-CM

## 2014-04-29 MED ORDER — HYDROCODONE-ACETAMINOPHEN 5-325 MG PO TABS
ORAL_TABLET | ORAL | Status: DC
Start: 2014-04-29 — End: 2014-05-04

## 2014-04-29 NOTE — Telephone Encounter (Signed)
[facility] 

## 2014-04-29 NOTE — Progress Notes (Signed)
Patient ID: Joseph Ponce, male   DOB: 1924-01-13, 79 y.o.   MRN: [id number]    Nursing Home Location:  [facility]   Place of Service:  SNF  PCP: Cyndee Brightly, MD  No Known Allergies  Chief Complaint  Patient presents with  . Medical Management of Chronic Issues    HPI:  80 year old male who is a LTR of [facility] with a pmh of dementia, OP, FTT, CVA, anemia, hyperlipidemia, PVD and htn. Pt is being seen today for routine follow up on chronic conditions. Staff reports increase lethargy in the mornings. Pt easily aroused but does not want to get oob. Pt currently getting scheduled norco QID due to pain in his legs. Pt reports ongoing pain however he is falling asleep during the exam  Review of Systems:  Review of Systems  Constitutional: Negative for fever, chills and malaise/fatigue.  Eyes: Negative.   Respiratory: Negative for cough and shortness of breath.   Cardiovascular: Negative for chest pain and leg swelling.  Gastrointestinal: Negative for heartburn, abdominal pain, diarrhea and constipation.  Genitourinary: Negative for dysuria, urgency and frequency.  Musculoskeletal: Positive for myalgias (in right leg). Negative for joint pain.  Skin: Negative.   Neurological: Negative for dizziness, tremors, weakness and headaches.  Psychiatric/Behavioral: Positive for memory loss. Negative for depression. The patient is not nervous/anxious and does not have insomnia.      Past Medical History  Diagnosis Date  . Dementia   . Muscle weakness   . Osteoporosis   . Alzheimer disease   . Failure to thrive in adult   . Dementia   . CVA (cerebral vascular accident)     speech and language d/o deficits  . Weakness   . Hyperlipemia   . Dysphagia   . Anemia   . PVD (peripheral vascular disease)   . COPD (chronic obstructive pulmonary disease)   . Glaucoma (increased eye pressure)   . Hypertension   . S/P BKA (below knee amputation) unilateral 01/21/2006    left  . Renal artery stenosis    Past Surgical History  Procedure Laterality Date  . Leg amputation below knee      left   Social History:   reports that he has quit smoking. He does not have any smokeless tobacco history on file. He reports that he does not drink alcohol or use illicit drugs.  No family history on file.  Medications: Patient's Medications  New Prescriptions   No medications on file  Previous Medications   AMINO ACIDS-PROTEIN HYDROLYS (FEEDING SUPPLEMENT, PRO-STAT SUGAR FREE 64,) LIQD    Take 30 mLs by mouth 2 (two) times daily.   AMLODIPINE (NORVASC) 10 MG TABLET    Take 10 mg by mouth daily.   ASPIRIN 81 MG CHEWABLE TABLET    Chew 81 mg by mouth daily.   BRIMONIDINE (ALPHAGAN P) 0.1 % SOLN    Place 1 drop into both eyes 2 (two) times daily.   CALCIUM-VITAMIN D (OSCAL WITH D) 500-200 MG-UNIT PER TABLET    Take 1 tablet by mouth 2 (two) times daily.   CEPHALEXIN (KEFLEX) 500 MG CAPSULE    Take 1 capsule (500 mg total) by mouth 4 (four) times daily.   CLOPIDOGREL (PLAVIX) 75 MG TABLET    Take 75 mg by mouth daily.   DENOSUMAB (PROLIA) 60 MG/ML SOLN INJECTION    Inject 60 mg into the skin every 6 (six) months.    DESMOPRESSIN (DDAVP) 0.01 % NASAL SOLUTION  Place 10 mcg into the nose 3 (three) times daily.   DORZOLAMIDE (TRUSOPT) 2 % OPHTHALMIC SOLUTION    Place 1 drop into both eyes 2 (two) times daily.   FEEDING SUPPLEMENT, RESOURCE BREEZE, (RESOURCE BREEZE) LIQD    Take 1 Container by mouth 2 (two) times daily between meals.   GABAPENTIN (NEURONTIN) 100 MG CAPSULE    Take 100 mg by mouth 2 (two) times daily.   HYDROCODONE-ACETAMINOPHEN (NORCO/VICODIN) 5-325 MG PER TABLET    Take 1 tablet by mouth 4 (four) times daily as needed for moderate pain.   HYDROXYPROPYL METHYLCELLULOSE (ISOPTO TEARS) 2.5 % OPHTHALMIC SOLUTION    Place 1 drop into both eyes 3 (three) times daily.   MAGNESIUM HYDROXIDE (MILK OF MAGNESIA) 400 MG/5ML SUSPENSION    Take 30 mLs by mouth daily as  needed. For constipation   MEMANTINE HCL ER (NAMENDA XR) 28 MG CP24    Take 28 mg by mouth daily.   MULTIPLE VITAMINS-MINERALS (CERTA-VITE PO)    Take 1 tablet by mouth daily.   NITROGLYCERIN (NITRODUR - DOSED IN MG/24 HR) 0.2 MG/HR    Place 1 patch onto the skin daily.   POLYETHYLENE GLYCOL (MIRALAX / GLYCOLAX) PACKET    Take 17 g by mouth daily.   TRAVOPROST, BAK FREE, (TRAVATAN) 0.004 % SOLN OPHTHALMIC SOLUTION    Place 1 drop into both eyes at bedtime.  Modified Medications   No medications on file  Discontinued Medications   DONEPEZIL (ARICEPT) 10 MG TABLET    Take 10 mg by mouth at bedtime.   TRAZODONE (DESYREL) 50 MG TABLET    Take 25 mg by mouth at bedtime.     Physical Exam:  Filed Vitals:   04/29/14 1129  BP: 149/71  Pulse: 70  Temp: 97.8 F (36.6 C)  Resp: 18    Physical Exam  Vitals reviewed. Constitutional: He is well-developed, well-nourished, and in no distress.  HENT:  Mouth/Throat: Oropharynx is clear and moist. No oropharyngeal exudate.  Eyes: Conjunctivae and EOM are normal. Pupils are equal, round, and reactive to light.  Neck: Normal range of motion. Neck supple.  Cardiovascular: Normal rate, regular rhythm and normal heart sounds.   Pulmonary/Chest: Effort normal and breath sounds normal.  Abdominal: Soft. Bowel sounds are normal. He exhibits no distension.  Musculoskeletal: He exhibits no edema and no tenderness.  Left BKA; self propels in [facility], currently in bed  Neurological: He is alert.  Skin: Skin is warm and dry. He is not diaphoretic.  Psychiatric: Affect normal.     Labs reviewed: Basic Metabolic Panel:  Recent Labs  01/30/14 2324 01/30/14 2329  NA 147 151*  K 5.7* 5.7*  CL 114* 113*  CO2 26  --   GLUCOSE 87 88  BUN 46* 45*  CREATININE 1.80* 1.90*  CALCIUM 9.4  --    Liver Function Tests:  Recent Labs  01/30/14 2324  AST 17  ALT 13  ALKPHOS 80  BILITOT <0.2*  PROT 6.5  ALBUMIN 3.0*   No results found for this basename:  LIPASE, AMYLASE,  in the last 8760 hours No results found for this basename: AMMONIA,  in the last 8760 hours CBC:  Recent Labs  01/30/14 2324 01/30/14 2329  WBC 6.3  --   NEUTROABS 3.4  --   HGB 10.6* 11.6*  HCT 32.9* 34.0*  MCV 87.0  --   PLT 198  --    CBC with Diff  Result: 02/16/2014 5:47 PM ( Status: F )  WBC 5.2 4.0-10.5 K/uL SLN  RBC 3.94 L 4.22-5.81 MIL/uL SLN  Hemoglobin 10.6 L 13.0-17.0 g/dL SLN  Hematocrit 33.1 L 39.0-52.0 % SLN  MCV 84.0 78.0-100.0 fL SLN  MCH 26.9 26.0-34.0 pg SLN  MCHC 32.0 30.0-36.0 g/dL SLN  RDW 16.0 H 11.5-15.5 % SLN  Platelet Count 192 150-400 K/uL SLN  Granulocyte % 69 43-77 % SLN  Absolute Gran 3.6 1.7-7.7 K/uL SLN  Lymph % 20 12-46 % SLN  Absolute Lymph 1.0 0.7-4.0 K/uL SLN  Mono % 7 3-12 % SLN  Absolute Mono 0.4 0.1-1.0 K/uL SLN  Eos % 3 0-5 % SLN  Absolute Eos 0.2 0.0-0.7 K/uL SLN  Baso % 1 0-1 % SLN  Absolute Baso 0.1 0.0-0.1 K/uL SLN  Smear Review Criteria for review not met SLN  Basic Metabolic Panel  Result: 12/22/863 7:15 PM ( Status: F )  Sodium 150 H 135-145 mEq/L SLN  Potassium 4.1 3.5-5.3 mEq/L SLN  Chloride 114 H 96-112 mEq/L SLN  CO2 28 19-32 mEq/L SLN  Glucose 147 H 70-99 mg/dL SLN  BUN 41 H 6-23 mg/dL SLN  Creatinine 1.55 H 0.50-1.35 mg/dL SLN  Calcium 9.6   Assessment/Plan 1. Bradycardia, sinus -has been seen by Dr Dellia Nims and been sent to the ED multiple times in May regarding this, pt remains symptomatic, attempted to call RP but number did not go through, will have staff work on getting number   2. Coronary artery disease involving native coronary artery of native heart without angina pectoris -has been stable, no reports of chest pain  -conts on plavix  3. Dementia without behavioral disturbance -stable  4. Chronic pain syndrome With complaints of pain but increase lethargy which has been on-going, will decrease norco to BID with q 6 hours PRN  -conts on neurontin  5. Hypertension Stable

## 2014-05-01 ENCOUNTER — Inpatient Hospital Stay (HOSPITAL_COMMUNITY)
Admission: EM | Admit: 2014-05-01 | Discharge: 2014-05-04 | DRG: 871 | Disposition: A | Discharge status: Skilled Nursing Facility | Payer: Medicare Other | Attending: Internal Medicine | Admitting: Internal Medicine

## 2014-05-01 ENCOUNTER — Emergency Department (HOSPITAL_COMMUNITY): Payer: Medicare Other

## 2014-05-01 ENCOUNTER — Encounter (HOSPITAL_COMMUNITY): Payer: Self-pay | Admitting: Emergency Medicine

## 2014-05-01 DIAGNOSIS — F0280 Dementia in other diseases classified elsewhere without behavioral disturbance: Secondary | ICD-10-CM | POA: Diagnosis present

## 2014-05-01 DIAGNOSIS — E860 Dehydration: Secondary | ICD-10-CM

## 2014-05-01 DIAGNOSIS — G9340 Encephalopathy, unspecified: Secondary | ICD-10-CM | POA: Diagnosis present

## 2014-05-01 DIAGNOSIS — J189 Pneumonia, unspecified organism: Secondary | ICD-10-CM

## 2014-05-01 DIAGNOSIS — G309 Alzheimer's disease, unspecified: Secondary | ICD-10-CM | POA: Diagnosis present

## 2014-05-01 DIAGNOSIS — I10 Essential (primary) hypertension: Secondary | ICD-10-CM

## 2014-05-01 DIAGNOSIS — Z7901 Long term (current) use of anticoagulants: Secondary | ICD-10-CM

## 2014-05-01 DIAGNOSIS — I701 Atherosclerosis of renal artery: Secondary | ICD-10-CM

## 2014-05-01 DIAGNOSIS — I2510 Atherosclerotic heart disease of native coronary artery without angina pectoris: Secondary | ICD-10-CM

## 2014-05-01 DIAGNOSIS — F0390 Unspecified dementia without behavioral disturbance: Secondary | ICD-10-CM

## 2014-05-01 DIAGNOSIS — E785 Hyperlipidemia, unspecified: Secondary | ICD-10-CM

## 2014-05-01 DIAGNOSIS — M810 Age-related osteoporosis without current pathological fracture: Secondary | ICD-10-CM | POA: Diagnosis present

## 2014-05-01 DIAGNOSIS — S88119A Complete traumatic amputation at level between knee and ankle, unspecified lower leg, initial encounter: Secondary | ICD-10-CM

## 2014-05-01 DIAGNOSIS — I739 Peripheral vascular disease, unspecified: Secondary | ICD-10-CM

## 2014-05-01 DIAGNOSIS — R404 Transient alteration of awareness: Secondary | ICD-10-CM

## 2014-05-01 DIAGNOSIS — J449 Chronic obstructive pulmonary disease, unspecified: Secondary | ICD-10-CM | POA: Diagnosis present

## 2014-05-01 DIAGNOSIS — G894 Chronic pain syndrome: Secondary | ICD-10-CM

## 2014-05-01 DIAGNOSIS — K5900 Constipation, unspecified: Secondary | ICD-10-CM | POA: Diagnosis present

## 2014-05-01 DIAGNOSIS — G4700 Insomnia, unspecified: Secondary | ICD-10-CM

## 2014-05-01 DIAGNOSIS — N189 Chronic kidney disease, unspecified: Secondary | ICD-10-CM | POA: Diagnosis present

## 2014-05-01 DIAGNOSIS — J4489 Other specified chronic obstructive pulmonary disease: Secondary | ICD-10-CM | POA: Diagnosis present

## 2014-05-01 DIAGNOSIS — M79651 Pain in right thigh: Secondary | ICD-10-CM

## 2014-05-01 DIAGNOSIS — J42 Unspecified chronic bronchitis: Secondary | ICD-10-CM

## 2014-05-01 DIAGNOSIS — E870 Hyperosmolality and hypernatremia: Secondary | ICD-10-CM

## 2014-05-01 DIAGNOSIS — H409 Unspecified glaucoma: Secondary | ICD-10-CM | POA: Diagnosis present

## 2014-05-01 DIAGNOSIS — R0902 Hypoxemia: Secondary | ICD-10-CM

## 2014-05-01 DIAGNOSIS — R531 Weakness: Secondary | ICD-10-CM

## 2014-05-01 DIAGNOSIS — R627 Adult failure to thrive: Secondary | ICD-10-CM | POA: Diagnosis present

## 2014-05-01 DIAGNOSIS — I129 Hypertensive chronic kidney disease with stage 1 through stage 4 chronic kidney disease, or unspecified chronic kidney disease: Secondary | ICD-10-CM | POA: Diagnosis present

## 2014-05-01 DIAGNOSIS — Z7982 Long term (current) use of aspirin: Secondary | ICD-10-CM

## 2014-05-01 DIAGNOSIS — Z89519 Acquired absence of unspecified leg below knee: Secondary | ICD-10-CM

## 2014-05-01 DIAGNOSIS — R001 Bradycardia, unspecified: Secondary | ICD-10-CM

## 2014-05-01 DIAGNOSIS — R1310 Dysphagia, unspecified: Secondary | ICD-10-CM

## 2014-05-01 DIAGNOSIS — Z8673 Personal history of transient ischemic attack (TIA), and cerebral infarction without residual deficits: Secondary | ICD-10-CM

## 2014-05-01 DIAGNOSIS — Z87891 Personal history of nicotine dependence: Secondary | ICD-10-CM

## 2014-05-01 DIAGNOSIS — D649 Anemia, unspecified: Secondary | ICD-10-CM

## 2014-05-01 DIAGNOSIS — A419 Sepsis, unspecified organism: Principal | ICD-10-CM | POA: Diagnosis present

## 2014-05-01 LAB — URINALYSIS, ROUTINE W REFLEX MICROSCOPIC
Bilirubin Urine: NEGATIVE
GLUCOSE, UA: NEGATIVE mg/dL
Ketones, ur: NEGATIVE mg/dL
Nitrite: POSITIVE — AB
PH: 8.5 — AB (ref 5.0–8.0)
PROTEIN: 100 mg/dL — AB
Specific Gravity, Urine: 1.016 (ref 1.005–1.030)
Urobilinogen, UA: 0.2 mg/dL (ref 0.0–1.0)

## 2014-05-01 LAB — COMPREHENSIVE METABOLIC PANEL
ALBUMIN: 2.3 g/dL — AB (ref 3.5–5.2)
ALT: 39 U/L (ref 0–53)
AST: 50 U/L — ABNORMAL HIGH (ref 0–37)
Alkaline Phosphatase: 80 U/L (ref 39–117)
BUN: 55 mg/dL — ABNORMAL HIGH (ref 6–23)
CO2: 25 mEq/L (ref 19–32)
Calcium: 9.5 mg/dL (ref 8.4–10.5)
Chloride: 116 mEq/L — ABNORMAL HIGH (ref 96–112)
Creatinine, Ser: 2.86 mg/dL — ABNORMAL HIGH (ref 0.50–1.35)
GFR calc Af Amer: 21 mL/min — ABNORMAL LOW (ref >90)
GFR calc non Af Amer: 18 mL/min — ABNORMAL LOW (ref >90)
Glucose, Bld: 120 mg/dL — ABNORMAL HIGH (ref 70–99)
Potassium: 4.6 mEq/L (ref 3.7–5.3)
SODIUM: 153 meq/L — AB (ref 137–147)
TOTAL PROTEIN: 6.3 g/dL (ref 6.0–8.3)
Total Bilirubin: <0.2 mg/dL — ABNORMAL LOW (ref 0.3–1.2)

## 2014-05-01 LAB — CBC WITH DIFFERENTIAL/PLATELET
Basophils Absolute: 0 10*3/uL (ref 0.0–0.1)
Basophils Relative: 0 % (ref 0–1)
EOS PCT: 0 % (ref 0–5)
Eosinophils Absolute: 0 10*3/uL (ref 0.0–0.7)
HCT: 20.3 % — ABNORMAL LOW (ref 39.0–52.0)
Hemoglobin: 6.4 g/dL — CL (ref 13.0–17.0)
LYMPHS PCT: 12 % (ref 12–46)
Lymphs Abs: 1.8 10*3/uL (ref 0.7–4.0)
MCH: 27.1 pg (ref 26.0–34.0)
MCHC: 31.5 g/dL (ref 30.0–36.0)
MCV: 86 fL (ref 78.0–100.0)
Monocytes Absolute: 2 10*3/uL — ABNORMAL HIGH (ref 0.1–1.0)
Monocytes Relative: 13 % — ABNORMAL HIGH (ref 3–12)
NEUTROS PCT: 75 % (ref 43–77)
Neutro Abs: 11.2 10*3/uL — ABNORMAL HIGH (ref 1.7–7.7)
PLATELETS: 247 10*3/uL (ref 150–400)
RBC: 2.36 MIL/uL — ABNORMAL LOW (ref 4.22–5.81)
RDW: 15.9 % — ABNORMAL HIGH (ref 11.5–15.5)
WBC: 15 10*3/uL — AB (ref 4.0–10.5)

## 2014-05-01 LAB — URINE MICROSCOPIC-ADD ON

## 2014-05-01 LAB — POC OCCULT BLOOD, ED: Fecal Occult Bld: NEGATIVE

## 2014-05-01 LAB — PREPARE RBC (CROSSMATCH)

## 2014-05-01 LAB — I-STAT CG4 LACTIC ACID, ED: Lactic Acid, Venous: 1.16 mmol/L (ref 0.5–2.2)

## 2014-05-01 MED ORDER — DEXTROSE 5 % IV SOLN
2.0000 g | Freq: Two times a day (BID) | INTRAVENOUS | Status: DC
  Filled 2014-05-01: qty 2

## 2014-05-01 MED ORDER — SODIUM CHLORIDE 0.9 % IV BOLUS (SEPSIS)
500.0000 mL | Freq: Once | INTRAVENOUS | Status: AC
  Administered 2014-05-01: 500 mL via INTRAVENOUS

## 2014-05-01 MED ORDER — SODIUM CHLORIDE 0.9 % IV SOLN
INTRAVENOUS | Status: DC
  Administered 2014-05-01: 23:00:00 via INTRAVENOUS

## 2014-05-01 MED ORDER — VANCOMYCIN HCL IN DEXTROSE 1-5 GM/200ML-% IV SOLN
1000.0000 mg | Freq: Once | INTRAVENOUS | Status: AC
  Administered 2014-05-01: 1000 mg via INTRAVENOUS
  Filled 2014-05-01: qty 200

## 2014-05-01 MED ORDER — DEXTROSE 5 % IV SOLN
2.0000 g | Freq: Two times a day (BID) | INTRAVENOUS | Status: DC
  Administered 2014-05-01: 2 g via INTRAVENOUS
  Filled 2014-05-01: qty 2

## 2014-05-01 MED ORDER — HEPARIN SODIUM (PORCINE) 5000 UNIT/ML IJ SOLN
5000.0000 [IU] | Freq: Three times a day (TID) | INTRAMUSCULAR | Status: DC
  Administered 2014-05-01: 5000 [IU] via SUBCUTANEOUS
  Filled 2014-05-01 (×2): qty 1

## 2014-05-01 MED ORDER — ACETAMINOPHEN 650 MG RE SUPP
650.0000 mg | Freq: Once | RECTAL | Status: AC
  Administered 2014-05-01: 650 mg via RECTAL
  Filled 2014-05-01: qty 1

## 2014-05-01 MED ORDER — VANCOMYCIN HCL IN DEXTROSE 1-5 GM/200ML-% IV SOLN
1000.0000 mg | INTRAVENOUS | Status: DC
  Filled 2014-05-01: qty 200

## 2014-05-01 MED ORDER — CEFEPIME HCL 1 G IJ SOLR
1.0000 g | INTRAMUSCULAR | Status: DC
  Administered 2014-05-02 – 2014-05-03 (×2): 1 g via INTRAVENOUS
  Filled 2014-05-01 (×4): qty 1

## 2014-05-01 NOTE — ED Notes (Signed)
When entering the room, pt pulling at pulse ox cord. Pt able to state his name; will respond to name; pt moving bilateral arms without difficulty.  Bilateral IVs wrapped with kerlex to prevent pt from pulling on tubing

## 2014-05-01 NOTE — Progress Notes (Signed)
ANTIBIOTIC CONSULT NOTE - INITIAL  Pharmacy Consult for vancomycin Indication: pneumonia  No Known Allergies  Patient Measurements: Height: 6' (182.9 cm) Weight: 160 lb 4.4 oz (72.7 kg) IBW/kg (Calculated) : 77.6 Adjusted Body Weight:   Vital Signs: Temp: 97.5 F (36.4 C) (06/24 2109) Temp src: Oral (06/24 2109) BP: 164/84 mmHg (06/24 2109) Pulse Rate: 73 (06/24 2109) Intake/Output from previous day:   Intake/Output from this shift: Total I/O In: 750 [I.V.:500; IV Piggyback:250] Out: 225 [Urine:225]  Labs:  Recent Labs  05/01/14 1713  WBC 15.0*  HGB 6.4*  PLT 247  CREATININE 2.86*   Estimated Creatinine Clearance: 18.4 ml/min (by C-G formula based on Cr of 2.86). No results found for this basename: VANCOTROUGH, VANCOPEAK, VANCORANDOM, GENTTROUGH, GENTPEAK, GENTRANDOM, TOBRATROUGH, TOBRAPEAK, TOBRARND, AMIKACINPEAK, AMIKACINTROU, AMIKACIN,  in the last 72 hours   Microbiology: No results found for this or any previous visit (from the past 720 hour(s)).  Medical History: Past Medical History  Diagnosis Date  . Dementia   . Muscle weakness   . Osteoporosis   . Alzheimer disease   . Failure to thrive in adult   . Dementia   . CVA (cerebral vascular accident)     speech and language d/o deficits  . Weakness   . Hyperlipemia   . Dysphagia   . Anemia   . PVD (peripheral vascular disease)   . COPD (chronic obstructive pulmonary disease)   . Glaucoma (increased eye pressure)   . Hypertension   . S/P BKA (below knee amputation) unilateral 01/21/2006    left  . Renal artery stenosis     Medications:  Scheduled:  . [START ON 05/02/2014] ceFEPime (MAXIPIME) IV  1 g Intravenous Q24H  . heparin  5,000 Units Subcutaneous 3 times per day   Infusions:  . sodium chloride     Assessment: 80 yo male with PNA will be started on vancomycin and cefepime.  Patient got vancomycin 1g iv x1 at 1750 and cefepime 2g iv x1 at 1837 on 05/01/14.  Patient's SCr is 2.86 (CrCl  ~18)  Goal of Therapy:  Vancomycin trough level 15-20 mcg/ml  Plan:  1) Cefepime 1g iv q24h, next dose at 1800 on 05/02/14 2) Vancomycin 1g iv q48h, next dose at 1730 on 05/03/14 3) Monitor renal function and adjust dose accordringly 4) F/u culture and sensitivity and plan on antibiotic before checking vancomycin trough  So, Joseph Ponce 05/01/2014,9:41 PM

## 2014-05-01 NOTE — ED Notes (Signed)
Pt placed on 2L O2 via [place] for decreased sats 87%; Dr.Harrison aware

## 2014-05-01 NOTE — ED Notes (Signed)
[place] to [place] at [facility]; pt at baseline can "carry on conversation with staff;" reports she noticed pt's fever was elevated at 101.4 and pt was "completely unresponsive."

## 2014-05-01 NOTE — ED Notes (Addendum)
Pt to ED via EMS from [facility] c/o AMS x  1 week and decreased LOC. CXR completed at facility showing "left base infiltrate." [facility] reported increased temp of 102 prior to arrival. EMS BP 106/72; HR is 60s; 350mL NS given enroute. Pt at baseline has "dementia"

## 2014-05-01 NOTE — H&P (Signed)
[facility] History and Physical  Patient: Joseph Ponce  ZOX:[id number]  DOB: Mar 11, 1924  DOS: the patient was seen and examined on 05/01/2014 PCP: [name], MD  Chief Complaint: Altered mental status  HPI: Joseph Ponce is a 80 y.o. male with Past medical history of dementia, CVA, failure to thrive, anemia, peripheral vascular disease, hypertension, left BKA, chronic hypernatremia. Patient presented from a nursing home. He was brought in because of altered mental status. At his baseline the patient is able to carry on a conversation. Since last one week he has been having progressive slowing and less complicated. He has been having generalized weakness. Today there was decreased consciousness as well. A chest x-ray was done which showed left-sided infiltrate and the patient was brought here as he was also having fevers too. At the time of my evaluation the patient has improved significantly from his prior arrival. He has been able to carry on a conversation denies any active pain in his chest or abdomen. Complains of some pain in his back.. Is not able to provide any prior history has been ongoing since last few days. Denies any nausea or vomiting.  He feels constipated and does not have any focal neurological deficit as per him. He has chronic contracture on the right side.  The patient is coming from SNF. And at his baseline dependent for most of his ADL.  Review of Systems: as mentioned in the history of present illness.  A Comprehensive review of the other systems is negative.  Past Medical History  Diagnosis Date  . Dementia   . Muscle weakness   . Osteoporosis   . Alzheimer disease   . Failure to thrive in adult   . Dementia   . CVA (cerebral vascular accident)     speech and language d/o deficits  . Weakness   . Hyperlipemia   . Dysphagia   . Anemia   . PVD (peripheral vascular disease)   . COPD (chronic obstructive pulmonary disease)   . Glaucoma (increased  eye pressure)   . Hypertension   . S/P BKA (below knee amputation) unilateral 01/21/2006    left  . Renal artery stenosis    Past Surgical History  Procedure Laterality Date  . Leg amputation below knee      left   Social History:  reports that he has quit smoking. He does not have any smokeless tobacco history on file. He reports that he does not drink alcohol or use illicit drugs.  No Known Allergies  History reviewed. No pertinent family history.  Prior to Admission medications   Medication Sig Start Date End Date Taking? Authorizing Burleigh Brockmann  Amino Acids-Protein Hydrolys (FEEDING SUPPLEMENT, PRO-STAT SUGAR FREE 64,) LIQD Take 30 mLs by mouth 2 (two) times daily.    Historical Riese Hellard, MD  amLODipine (NORVASC) 10 MG tablet Take 10 mg by mouth daily.    Historical Francyne Arreaga, MD  aspirin 81 MG chewable tablet Chew 81 mg by mouth daily.    Historical Laine Giovanetti, MD  brimonidine (ALPHAGAN P) 0.1 % SOLN Place 1 drop into both eyes 2 (two) times daily.    Historical Zachrey Deutscher, MD  calcium-vitamin D (OSCAL WITH D) 500-200 MG-UNIT per tablet Take 1 tablet by mouth 2 (two) times daily.    Historical Stephane Junkins, MD  cephALEXin (KEFLEX) 500 MG capsule Take 1 capsule (500 mg total) by mouth 4 (four) times daily. 03/01/14   Raeford Razor, MD  clopidogrel (PLAVIX) 75 MG tablet Take 75 mg by mouth daily.  Historical Jovanne Riggenbach, MD  denosumab (PROLIA) 60 MG/ML SOLN injection Inject 60 mg into the skin every 6 (six) months.     Historical Gordy Goar, MD  desmopressin (DDAVP) 0.01 % nasal solution Place 10 mcg into the nose 3 (three) times daily.    Historical Keontre Defino, MD  dorzolamide (TRUSOPT) 2 % ophthalmic solution Place 1 drop into both eyes 2 (two) times daily.    Historical Achsah Mcquade, MD  feeding supplement, RESOURCE BREEZE, (RESOURCE BREEZE) LIQD Take 1 Container by mouth 2 (two) times daily between meals.    Historical Kenedi Cilia, MD  gabapentin (NEURONTIN) 100 MG capsule Take 100 mg by mouth 2 (two)  times daily.    Historical Jaquaya Coyle, MD  HYDROcodone-acetaminophen (NORCO/VICODIN) 5-325 MG per tablet Take one tablet by mouth twice daily for pain; Take one tablet by mouth every 6 hours as needed for pain 04/29/14   Tiffany L Reed, DO  hydroxypropyl methylcellulose (ISOPTO TEARS) 2.5 % ophthalmic solution Place 1 drop into both eyes 3 (three) times daily.    Historical Trystin Terhune, MD  magnesium hydroxide (MILK OF MAGNESIA) 400 MG/5ML suspension Take 30 mLs by mouth daily as needed. For constipation    Historical Analya Louissaint, MD  Memantine HCl ER (NAMENDA XR) 28 MG CP24 Take 28 mg by mouth daily.    Historical Fleet Higham, MD  Multiple Vitamins-Minerals (CERTA-VITE PO) Take 1 tablet by mouth daily.    Historical Rasheedah Reis, MD  nitroGLYCERIN (NITRODUR - DOSED IN MG/24 HR) 0.2 mg/hr Place 1 patch onto the skin daily.    Historical Masaji Billups, MD  polyethylene glycol (MIRALAX / GLYCOLAX) packet Take 17 g by mouth daily.    Historical Xela Oregel, MD  Travoprost, BAK Free, (TRAVATAN) 0.004 % SOLN ophthalmic solution Place 1 drop into both eyes at bedtime.    Historical Davison Ohms, MD    Physical Exam: Filed Vitals:   05/01/14 2000 05/01/14 2030 05/01/14 2042 05/01/14 2109  BP: 144/66 162/66 162/66 164/84  Pulse: 70 70 67 73  Temp:    97.5 F (36.4 C)  TempSrc:    Oral  Resp: 14 12 12 15   Height:    6' (1.829 m)  Weight:    72.7 kg (160 lb 4.4 oz)  SpO2: 97% 100% 100% 99%    General: Alert, Awake and Oriented to Time, Place and Person. Appear in mild distress Eyes: PERRL ENT: Oral Mucosa clear moist. Neck: no JVD Cardiovascular: S1 and S2 Present, no Murmur, Peripheral Pulses Present Respiratory: Bilateral Air entry equal and Decreased, left-sided Crackles, no wheezes Abdomen: Bowel Sound Present, Soft and Non tender Skin: No Rash Extremities: No Pedal edema, no calf tenderness Neurologic: Grossly no focal neuro deficit.  Labs on Admission:  CBC:  Recent Labs Lab 05/01/14 1713  WBC 15.0*   NEUTROABS 11.2*  HGB 6.4*  HCT 20.3*  MCV 86.0  PLT 247    CMP     Component Value Date/Time   NA 153* 05/01/2014 1713   K 4.6 05/01/2014 1713   CL 116* 05/01/2014 1713   CO2 25 05/01/2014 1713   GLUCOSE 120* 05/01/2014 1713   BUN 55* 05/01/2014 1713   CREATININE 2.86* 05/01/2014 1713   CALCIUM 9.5 05/01/2014 1713   PROT 6.3 05/01/2014 1713   ALBUMIN 2.3* 05/01/2014 1713   AST 50* 05/01/2014 1713   ALT 39 05/01/2014 1713   ALKPHOS 80 05/01/2014 1713   BILITOT <0.2* 05/01/2014 1713   GFRNONAA 18* 05/01/2014 1713   GFRAA 21* 05/01/2014 1713    No results found  for this basename: LIPASE, AMYLASE,  in the last 168 hours No results found for this basename: AMMONIA,  in the last 168 hours  No results found for this basename: CKTOTAL, CKMB, CKMBINDEX, TROPONINI,  in the last 168 hours BNP (last 3 results) No results found for this basename: PROBNP,  in the last 8760 hours  Radiological Exams on Admission: Ct Head Wo Contrast  05/01/2014   CLINICAL DATA:  Altered mental status with decreased level consciousness.  EXAM: CT HEAD WITHOUT CONTRAST  TECHNIQUE: Contiguous axial images were obtained from the base of the skull through the vertex without intravenous contrast.  COMPARISON:  CT 01/30/2014  FINDINGS: No acute intracranial hemorrhage. No focal mass lesion. No CT evidence of acute infarction. No midline shift or mass effect. No hydrocephalus. Basilar cisterns are patent.  There is severe cortical atrophy with a frontal lobe predominance not changed from prior. There is ventricular dilatation proportional to the cortical atrophy. No periventricular subcortical white matter hypodensities.  Paranasal sinuses and  mastoid air cells are clear.  IMPRESSION: 1. No acute intracranial findings.  No change from prior. 2. Extensive cortical atrophy. 3. Extensive periventricular and subcortical white matter microvascular disease.   Electronically Signed   By: Genevive Bi M.D.   On: 05/01/2014 19:19   Dg  Chest Port 1 View  05/01/2014   CLINICAL DATA:  Pneumonia.  EXAM: PORTABLE CHEST - 1 VIEW  COMPARISON:  01/31/2014  FINDINGS: There is a new patchy infiltrate in the left lower lobe. Heart size is normal. There is tortuosity of thoracic aorta. There is chronic accentuation of the interstitial markings at the right lung base. Pulmonary vascularity is normal. No effusions. No acute osseous abnormality. Multiple old healed left posterior lateral rib fractures.  IMPRESSION: Acute infiltrate in the left lower lobe.   Electronically Signed   By: Geanie Cooley M.D.   On: 05/01/2014 18:20    Assessment/Plan Principal Problem:   HCAP (healthcare-associated pneumonia) Active Problems:   Altered mental status   Dementia without behavioral disturbance   Anemia   S/P BKA (below knee amputation) unilateral   Hypertension   Dysphagia   Hyperlipemia   Chronic pain syndrome   1. HCAP (healthcare-associated pneumonia) Patient presented with complaints of decreased level of consciousness and fever. He is found to have a left-sided basal infiltrate with anemia, leukocytosis, mild dehydration from mild AKI. At present the patient was readmitted to the hospital. I would continue him on IV fluids. I will give him IV vancomycin and cefepime for broad-spectrum coverage. Blood cultures sputum cultures and urine antigens will be checked. Urine culture will also be checked for possible UTI GERD At present he appears hemodynamically stable. That is possible aspiration therefore I will keep the patient n.p.o. and obtain speech evaluation in the morning. Holding medications this morning.  2. Anemia The patient's hemoglobin 6.5. No prior history of anticoagulation. Nursing home denies any active bleeding or any black or bowel movements. Hemoccult is negative. Patient is receiving one unit of blood at present. We'll obtain the other workup after his blood is completed. Recheck hemoglobin. Monitor Hemoccult. Holding  aspirin and Plavix until active bleeding is rule out.  3. Chronic pain. Patient is unremarkable in the nursing home twice a day scheduled and every 6 hours as needed. At present I would hold it due to his presentation of acute mental status change and use IV morphine if he complains of severe pain as needed.  4. Dementia At present stable  5. Chronic hypernatremia Chronic kidney disease Patient has chronic hypernatremia as well as chronic kidney disease. Current presentation has mild worsening of his serum sodium and serum creatinine. I would hydrate him with IV fluids as current presentation appears secondary to perinatology. Recheck sodium  DVT Prophylaxis: mechanical compression device Nutrition: N.p.o.  Code Status: Full as per discussion by the ED physician with patient's daughter  Disposition: Admitted to inpatient in step-down unit.  Author: Lynden Oxford, MD [facility] Pager: [phone] 05/01/2014, 9:32 PM    If 7PM-7AM, please contact night-coverage [email or web address] Password [email or web address]  **Disclaimer: This note may have been dictated with voice recognition software. Similar sounding words can inadvertently be transcribed and this note may contain transcription errors which may not have been corrected upon publication of note.**

## 2014-05-01 NOTE — ED Notes (Addendum)
Per [facility], pt with decreased LOC x several days; recently seen in ED on 6/22. At baseline, pt a/o to person only. CXR at facility showed left lower lobe infiltrate. Pt was placed on 2L O2 today for oxygen sats 89-90%. Temp there was 101.4; tylenol 650mg  given at 2pm. Pt coughing up and swallowing phlegm. Able to squeeze hands when asked

## 2014-05-01 NOTE — ED Notes (Signed)
Pt moved from hallway to room; pt already in gown, placed on monitor, continuous pulse oximetry, blood pressure cuff and oxygen Sam[place] (3L); EKG and rectal temperature performed

## 2014-05-01 NOTE — ED Provider Notes (Signed)
CSN: [id number]     Arrival date & time 05/01/14  1701 History   First MD Initiated Contact with Patient 05/01/14 1707     Chief Complaint  Patient presents with  . Altered Mental Status     (Consider location/radiation/quality/duration/timing/severity/associated sxs/prior Treatment) Patient is a 80 y.o. male presenting with altered mental status. The history is provided by the patient.  Altered Mental Status Presenting symptoms: lethargy   Severity:  Moderate Most recent episode:  More than 2 days ago Episode history:  Continuous Timing:  Constant Progression:  Worsening Chronicity:  New Context comment:  Fever Associated symptoms: fever   Associated symptoms: no abdominal pain, no headaches, no nausea and no vomiting     Past Medical History  Diagnosis Date  . Dementia   . Muscle weakness   . Osteoporosis   . Alzheimer disease   . Failure to thrive in adult   . Dementia   . CVA (cerebral vascular accident)     speech and language d/o deficits  . Weakness   . Hyperlipemia   . Dysphagia   . Anemia   . PVD (peripheral vascular disease)   . COPD (chronic obstructive pulmonary disease)   . Glaucoma (increased eye pressure)   . Hypertension   . S/P BKA (below knee amputation) unilateral 01/21/2006    left  . Renal artery stenosis    Past Surgical History  Procedure Laterality Date  . Leg amputation below knee      left   History reviewed. No pertinent family history. History  Substance Use Topics  . Smoking status: Former Games developer  . Smokeless tobacco: Not on file  . Alcohol Use: No    Review of Systems  Constitutional: Positive for fever.  HENT: Negative for drooling and rhinorrhea.   Eyes: Negative for pain.  Respiratory: Positive for cough. Negative for shortness of breath.   Cardiovascular: Negative for chest pain and leg swelling.  Gastrointestinal: Negative for nausea, vomiting, abdominal pain and diarrhea.  Genitourinary: Negative for dysuria and  hematuria.  Musculoskeletal: Negative for gait problem and neck pain.  Skin: Negative for color change.  Neurological: Negative for numbness and headaches.  Hematological: Negative for adenopathy.  Psychiatric/Behavioral: Negative for behavioral problems.  All other systems reviewed and are negative.     Allergies  Review of patient's allergies indicates no known allergies.  Home Medications   Prior to Admission medications   Medication Sig Start Date End Date Taking? Authorizing Provider  Amino Acids-Protein Hydrolys (FEEDING SUPPLEMENT, PRO-STAT SUGAR FREE 64,) LIQD Take 30 mLs by mouth 2 (two) times daily.    Historical Provider, MD  amLODipine (NORVASC) 10 MG tablet Take 10 mg by mouth daily.    Historical Provider, MD  aspirin 81 MG chewable tablet Chew 81 mg by mouth daily.    Historical Provider, MD  brimonidine (ALPHAGAN P) 0.1 % SOLN Place 1 drop into both eyes 2 (two) times daily.    Historical Provider, MD  calcium-vitamin D (OSCAL WITH D) 500-200 MG-UNIT per tablet Take 1 tablet by mouth 2 (two) times daily.    Historical Provider, MD  cephALEXin (KEFLEX) 500 MG capsule Take 1 capsule (500 mg total) by mouth 4 (four) times daily. 03/01/14   Raeford Razor, MD  clopidogrel (PLAVIX) 75 MG tablet Take 75 mg by mouth daily.    Historical Provider, MD  denosumab (PROLIA) 60 MG/ML SOLN injection Inject 60 mg into the skin every 6 (six) months.     Historical Provider,  MD  desmopressin (DDAVP) 0.01 % nasal solution Place 10 mcg into the nose 3 (three) times daily.    Historical Provider, MD  dorzolamide (TRUSOPT) 2 % ophthalmic solution Place 1 drop into both eyes 2 (two) times daily.    Historical Provider, MD  feeding supplement, RESOURCE BREEZE, (RESOURCE BREEZE) LIQD Take 1 Container by mouth 2 (two) times daily between meals.    Historical Provider, MD  gabapentin (NEURONTIN) 100 MG capsule Take 100 mg by mouth 2 (two) times daily.    Historical Provider, MD   HYDROcodone-acetaminophen (NORCO/VICODIN) 5-325 MG per tablet Take one tablet by mouth twice daily for pain; Take one tablet by mouth every 6 hours as needed for pain 04/29/14   Tiffany L Reed, DO  hydroxypropyl methylcellulose (ISOPTO TEARS) 2.5 % ophthalmic solution Place 1 drop into both eyes 3 (three) times daily.    Historical Provider, MD  magnesium hydroxide (MILK OF MAGNESIA) 400 MG/5ML suspension Take 30 mLs by mouth daily as needed. For constipation    Historical Provider, MD  Memantine HCl ER (NAMENDA XR) 28 MG CP24 Take 28 mg by mouth daily.    Historical Provider, MD  Multiple Vitamins-Minerals (CERTA-VITE PO) Take 1 tablet by mouth daily.    Historical Provider, MD  nitroGLYCERIN (NITRODUR - DOSED IN MG/24 HR) 0.2 mg/hr Place 1 patch onto the skin daily.    Historical Provider, MD  polyethylene glycol (MIRALAX / GLYCOLAX) packet Take 17 g by mouth daily.    Historical Provider, MD  Travoprost, BAK Free, (TRAVATAN) 0.004 % SOLN ophthalmic solution Place 1 drop into both eyes at bedtime.    Historical Provider, MD   BP 124/57  Pulse 63  Temp(Src) 101.3 F (38.5 C) (Rectal)  Resp 14  SpO2 97% Physical Exam  Nursing note and vitals reviewed. Constitutional: He is oriented to person, place, and time. He appears well-developed and well-nourished. He appears lethargic.  HENT:  Head: Normocephalic and atraumatic.  Right Ear: External ear normal.  Left Ear: External ear normal.  Nose: Nose normal.  Mouth/Throat: Oropharynx is clear and moist. No oropharyngeal exudate.  Eyes: Conjunctivae and EOM are normal.  Pinpoint pupil on the right. 5 mm pupil on the left. Thought to be baseline per staff at his facility.   Neck: Normal range of motion. Neck supple.  Cardiovascular: Normal rate, regular rhythm, normal heart sounds and intact distal pulses.  Exam reveals no gallop and no friction rub.   No murmur heard. Pulmonary/Chest: Effort normal and breath sounds normal. No respiratory  distress. He has no wheezes.  Abdominal: Soft. Bowel sounds are normal. He exhibits no distension. There is no tenderness. There is no rebound and no guarding.  Genitourinary: Penis normal.  Musculoskeletal: Normal range of motion. He exhibits no edema and no tenderness.  No sacral wounds noted.  Neurological: He is oriented to person, place, and time. He appears lethargic. GCS eye subscore is 2. GCS verbal subscore is 1. GCS motor subscore is 6.  Moving limbs spontaneously. Was able to follow some very basic commands.  Skin: Skin is warm and dry.  Psychiatric: He has a normal mood and affect. His behavior is normal.    ED Course  Procedures (including critical care time) Labs Review Labs Reviewed  CBC WITH DIFFERENTIAL - Abnormal; Notable for the following:    WBC 15.0 (*)    RBC 2.36 (*)    Hemoglobin 6.4 (*)    HCT 20.3 (*)    RDW 15.9 (*)  Monocytes Relative 13 (*)    Neutro Abs 11.2 (*)    Monocytes Absolute 2.0 (*)    All other components within normal limits  COMPREHENSIVE METABOLIC PANEL - Abnormal; Notable for the following:    Sodium 153 (*)    Chloride 116 (*)    Glucose, Bld 120 (*)    BUN 55 (*)    Creatinine, Ser 2.86 (*)    Albumin 2.3 (*)    AST 50 (*)    Total Bilirubin <0.2 (*)    GFR calc non Af Amer 18 (*)    GFR calc Af Amer 21 (*)    All other components within normal limits  URINALYSIS, ROUTINE W REFLEX MICROSCOPIC - Abnormal; Notable for the following:    APPearance CLOUDY (*)    pH 8.5 (*)    Hgb urine dipstick LARGE (*)    Protein, ur 100 (*)    Nitrite POSITIVE (*)    Leukocytes, UA LARGE (*)    All other components within normal limits  URINE MICROSCOPIC-ADD ON - Abnormal; Notable for the following:    Bacteria, UA MANY (*)    All other components within normal limits  COMPREHENSIVE METABOLIC PANEL - Abnormal; Notable for the following:    Sodium 153 (*)    Chloride 117 (*)    Glucose, Bld 115 (*)    BUN 49 (*)    Creatinine, Ser 2.40  (*)    Albumin 2.4 (*)    GFR calc non Af Amer 23 (*)    GFR calc Af Amer 26 (*)    All other components within normal limits  CBC WITH DIFFERENTIAL - Abnormal; Notable for the following:    WBC 15.2 (*)    Hemoglobin 11.8 (*)    HCT 37.2 (*)    RDW 15.7 (*)    Neutro Abs 11.6 (*)    Monocytes Absolute 1.8 (*)    All other components within normal limits  HAPTOGLOBIN - Abnormal; Notable for the following:    Haptoglobin 335 (*)    All other components within normal limits  PROTIME-INR - Abnormal; Notable for the following:    Prothrombin Time 17.3 (*)    All other components within normal limits  APTT - Abnormal; Notable for the following:    aPTT 39 (*)    All other components within normal limits  MRSA PCR SCREENING  CULTURE, BLOOD (ROUTINE X 2)  CULTURE, BLOOD (ROUTINE X 2)  URINE CULTURE  GRAM STAIN  CULTURE, EXPECTORATED SPUTUM-ASSESSMENT  STREP PNEUMONIAE URINARY ANTIGEN  RETICULOCYTES  LACTATE DEHYDROGENASE  LEGIONELLA ANTIGEN, URINE  I-STAT CG4 LACTIC ACID, ED  POC OCCULT BLOOD, ED  PREPARE RBC (CROSSMATCH)  TYPE AND SCREEN    Imaging Review Ct Head Wo Contrast  05/01/2014   CLINICAL DATA:  Altered mental status with decreased level consciousness.  EXAM: CT HEAD WITHOUT CONTRAST  TECHNIQUE: Contiguous axial images were obtained from the base of the skull through the vertex without intravenous contrast.  COMPARISON:  CT 01/30/2014  FINDINGS: No acute intracranial hemorrhage. No focal mass lesion. No CT evidence of acute infarction. No midline shift or mass effect. No hydrocephalus. Basilar cisterns are patent.  There is severe cortical atrophy with a frontal lobe predominance not changed from prior. There is ventricular dilatation proportional to the cortical atrophy. No periventricular subcortical white matter hypodensities.  Paranasal sinuses and  mastoid air cells are clear.  IMPRESSION: 1. No acute intracranial findings.  No change from prior. 2.  Extensive cortical  atrophy. 3. Extensive periventricular and subcortical white matter microvascular disease.   Electronically Signed   By: Genevive Bi M.D.   On: 05/01/2014 19:19   Dg Chest Port 1 View  05/01/2014   CLINICAL DATA:  Pneumonia.  EXAM: PORTABLE CHEST - 1 VIEW  COMPARISON:  01/31/2014  FINDINGS: There is a new patchy infiltrate in the left lower lobe. Heart size is normal. There is tortuosity of thoracic aorta. There is chronic accentuation of the interstitial markings at the right lung base. Pulmonary vascularity is normal. No effusions. No acute osseous abnormality. Multiple old healed left posterior lateral rib fractures.  IMPRESSION: Acute infiltrate in the left lower lobe.   Electronically Signed   By: Geanie Cooley M.D.   On: 05/01/2014 18:20     EKG Interpretation   Date/Time:  Wednesday May 01 2014 17:15:11 EDT Ventricular Rate:  61 PR Interval:  185 QRS Duration: 90 QT Interval:  425 QTC Calculation: 428 R Axis:   49 Text Interpretation:  Pacemaker spikes or artifacts Sinus rhythm Probable  left atrial enlargement Left ventricular hypertrophy Confirmed by HARRISON   MD, FORREST ([id number]) on 05/01/2014 5:29:03 PM     CRITICAL CARE Performed by: Purvis Sheffield, S Total critical care time: 30 min Critical care time was exclusive of separately billable procedures and treating other patients. Critical care was necessary to treat or prevent imminent or life-threatening deterioration. Critical care was time spent personally by me on the following activities: development of treatment plan with patient and/or surrogate as well as nursing, discussions with consultants, evaluation of patient's response to treatment, examination of patient, obtaining history from patient or surrogate, ordering and performing treatments and interventions, ordering and review of laboratory studies, ordering and review of radiographic studies, pulse oximetry and re-evaluation of patient's condition.  MDM    Final diagnoses:  Anemia, unspecified  HCAP (healthcare-associated pneumonia)  Hypoxia  Sepsis, due to unspecified organism    5:37 PM 80 y.o. male who presents from [facility] rehabilitation with report of altered mental status for one week. They report dec level of consciousness. He was found to have a left lower lobe infiltrate on chest x-ray which was done recently. He has a history of dementia and per report is typically only alert and oriented to himself. He has a GCS of 9 currently on exam and appears very drowsy. He is febrile but his vital signs are otherwise unremarkable. Will get labs and imaging.  I discussed his anemia w/ his daughter [name]. She authorized blood transfusion via telephone.   Pt will be admitted to stepdown by the hospitalist. Critical care billed in this pt w/ Sepsis d/t HCAP. Also with hypoxia and Anemia requiring blood transfusion. The pt had a GCS of 9 and required close observation to monitor his airway.     Junius Argyle, MD 05/02/14 [phone]

## 2014-05-01 NOTE — ED Notes (Signed)
Dr Harrison at bedside

## 2014-05-01 NOTE — ED Notes (Signed)
Oxygen d/c per Dr.Harrison for room air O2 sat

## 2014-05-02 DIAGNOSIS — F0390 Unspecified dementia without behavioral disturbance: Secondary | ICD-10-CM

## 2014-05-02 LAB — COMPREHENSIVE METABOLIC PANEL
ALK PHOS: 90 U/L (ref 39–117)
ALT: 35 U/L (ref 0–53)
AST: 34 U/L (ref 0–37)
Albumin: 2.4 g/dL — ABNORMAL LOW (ref 3.5–5.2)
BILIRUBIN TOTAL: 0.8 mg/dL (ref 0.3–1.2)
BUN: 49 mg/dL — ABNORMAL HIGH (ref 6–23)
CALCIUM: 9.8 mg/dL (ref 8.4–10.5)
CHLORIDE: 117 meq/L — AB (ref 96–112)
CO2: 19 mEq/L (ref 19–32)
Creatinine, Ser: 2.4 mg/dL — ABNORMAL HIGH (ref 0.50–1.35)
GFR calc Af Amer: 26 mL/min — ABNORMAL LOW (ref >90)
GFR, EST NON AFRICAN AMERICAN: 23 mL/min — AB (ref >90)
GLUCOSE: 115 mg/dL — AB (ref 70–99)
POTASSIUM: 4.5 meq/L (ref 3.7–5.3)
SODIUM: 153 meq/L — AB (ref 137–147)
Total Protein: 7.3 g/dL (ref 6.0–8.3)

## 2014-05-02 LAB — URINE CULTURE: Colony Count: 70000

## 2014-05-02 LAB — CBC WITH DIFFERENTIAL/PLATELET
BASOS ABS: 0 10*3/uL (ref 0.0–0.1)
BASOS PCT: 0 % (ref 0–1)
Eosinophils Absolute: 0 10*3/uL (ref 0.0–0.7)
Eosinophils Relative: 0 % (ref 0–5)
HCT: 37.2 % — ABNORMAL LOW (ref 39.0–52.0)
HEMOGLOBIN: 11.8 g/dL — AB (ref 13.0–17.0)
Lymphocytes Relative: 12 % (ref 12–46)
Lymphs Abs: 1.8 10*3/uL (ref 0.7–4.0)
MCH: 27.3 pg (ref 26.0–34.0)
MCHC: 31.7 g/dL (ref 30.0–36.0)
MCV: 85.9 fL (ref 78.0–100.0)
MONO ABS: 1.8 10*3/uL — AB (ref 0.1–1.0)
Monocytes Relative: 12 % (ref 3–12)
NEUTROS ABS: 11.6 10*3/uL — AB (ref 1.7–7.7)
Neutrophils Relative %: 76 % (ref 43–77)
Platelets: 212 10*3/uL (ref 150–400)
RBC: 4.33 MIL/uL (ref 4.22–5.81)
RDW: 15.7 % — AB (ref 11.5–15.5)
WBC: 15.2 10*3/uL — ABNORMAL HIGH (ref 4.0–10.5)

## 2014-05-02 LAB — LACTATE DEHYDROGENASE: LDH: 196 U/L (ref 94–250)

## 2014-05-02 LAB — RETICULOCYTES
RBC.: 4.3 MIL/uL (ref 4.22–5.81)
Retic Count, Absolute: 30.1 10*3/uL (ref 19.0–186.0)
Retic Ct Pct: 0.7 % (ref 0.4–3.1)

## 2014-05-02 LAB — PROTIME-INR
INR: 1.41 (ref 0.00–1.49)
PROTHROMBIN TIME: 17.3 s — AB (ref 11.6–15.2)

## 2014-05-02 LAB — MRSA PCR SCREENING: MRSA by PCR: NEGATIVE

## 2014-05-02 LAB — STREP PNEUMONIAE URINARY ANTIGEN: Strep Pneumo Urinary Antigen: NEGATIVE

## 2014-05-02 LAB — APTT: aPTT: 39 seconds — ABNORMAL HIGH (ref 24–37)

## 2014-05-02 LAB — HAPTOGLOBIN: HAPTOGLOBIN: 335 mg/dL — AB (ref 45–215)

## 2014-05-02 MED ORDER — AMLODIPINE BESYLATE 10 MG PO TABS
10.0000 mg | ORAL_TABLET | Freq: Every day | ORAL | Status: DC
  Administered 2014-05-02 – 2014-05-04 (×3): 10 mg via ORAL
  Filled 2014-05-02 (×3): qty 1

## 2014-05-02 MED ORDER — DESMOPRESSIN ACE RHINAL TUBE 0.01 % NA SOLN: 10.0000 ug | Freq: Three times a day (TID) | NASAL | Status: DC

## 2014-05-02 MED ORDER — ASPIRIN 81 MG PO CHEW
81.0000 mg | CHEWABLE_TABLET | Freq: Every day | ORAL | Status: DC
  Administered 2014-05-02 – 2014-05-04 (×3): 81 mg via ORAL
  Filled 2014-05-02 (×3): qty 1

## 2014-05-02 MED ORDER — DESMOPRESSIN ACE SPRAY REFRIG 0.01 % NA SOLN
1.0000 | Freq: Three times a day (TID) | NASAL | Status: DC
  Administered 2014-05-02 – 2014-05-04 (×7): 1 via NASAL
  Filled 2014-05-02 (×2): qty 5

## 2014-05-02 MED ORDER — DORZOLAMIDE HCL 2 % OP SOLN
1.0000 [drp] | Freq: Two times a day (BID) | OPHTHALMIC | Status: DC
  Administered 2014-05-02 – 2014-05-04 (×5): 1 [drp] via OPHTHALMIC
  Filled 2014-05-02: qty 10

## 2014-05-02 MED ORDER — HYPROMELLOSE (GONIOSCOPIC) 2.5 % OP SOLN
1.0000 [drp] | Freq: Three times a day (TID) | OPHTHALMIC | Status: DC
  Filled 2014-05-02 (×2): qty 15

## 2014-05-02 MED ORDER — POLYETHYLENE GLYCOL 3350 17 G PO PACK
17.0000 g | PACK | Freq: Every day | ORAL | Status: DC
  Administered 2014-05-03 – 2014-05-04 (×2): 17 g via ORAL
  Filled 2014-05-02 (×3): qty 1

## 2014-05-02 MED ORDER — POLYVINYL ALCOHOL 1.4 % OP SOLN
1.0000 [drp] | Freq: Three times a day (TID) | OPHTHALMIC | Status: DC
  Administered 2014-05-02 – 2014-05-04 (×6): 1 [drp] via OPHTHALMIC
  Filled 2014-05-02: qty 15

## 2014-05-02 MED ORDER — CLOPIDOGREL BISULFATE 75 MG PO TABS
75.0000 mg | ORAL_TABLET | Freq: Every day | ORAL | Status: DC
  Administered 2014-05-02 – 2014-05-04 (×3): 75 mg via ORAL
  Filled 2014-05-02 (×3): qty 1

## 2014-05-02 MED ORDER — DENOSUMAB 60 MG/ML ~~LOC~~ SOLN: 60.0000 mg | SUBCUTANEOUS | Status: DC

## 2014-05-02 MED ORDER — DEXTROSE 5 % IV SOLN
INTRAVENOUS | Status: DC
  Administered 2014-05-02: 100 mL via INTRAVENOUS
  Administered 2014-05-02 – 2014-05-04 (×4): via INTRAVENOUS

## 2014-05-02 MED ORDER — BRIMONIDINE TARTRATE 0.15 % OP SOLN
1.0000 [drp] | Freq: Three times a day (TID) | OPHTHALMIC | Status: DC
Start: 2014-05-02 — End: 2014-05-02
  Filled 2014-05-02 (×2): qty 5

## 2014-05-02 MED ORDER — MAGNESIUM HYDROXIDE 400 MG/5ML PO SUSP: 30.0000 mL | Freq: Every day | ORAL | Status: DC | PRN

## 2014-05-02 MED ORDER — PRO-STAT SUGAR FREE PO LIQD
30.0000 mL | Freq: Two times a day (BID) | ORAL | Status: DC
Start: 2014-05-02 — End: 2014-05-04
  Administered 2014-05-02 – 2014-05-04 (×4): 30 mL via ORAL
  Filled 2014-05-02 (×4): qty 30

## 2014-05-02 MED ORDER — LATANOPROST 0.005 % OP SOLN
1.0000 [drp] | Freq: Every day | OPHTHALMIC | Status: DC
  Administered 2014-05-02 – 2014-05-03 (×2): 1 [drp] via OPHTHALMIC
  Filled 2014-05-02: qty 2.5

## 2014-05-02 MED ORDER — BRIMONIDINE TARTRATE 0.2 % OP SOLN
1.0000 [drp] | Freq: Three times a day (TID) | OPHTHALMIC | Status: DC
  Administered 2014-05-02 – 2014-05-04 (×6): 1 [drp] via OPHTHALMIC
  Filled 2014-05-02: qty 5

## 2014-05-02 MED ORDER — BIOTENE DRY MOUTH MT LIQD
15.0000 mL | Freq: Two times a day (BID) | OROMUCOSAL | Status: DC
  Administered 2014-05-02 – 2014-05-04 (×5): 15 mL via OROMUCOSAL

## 2014-05-02 NOTE — Progress Notes (Signed)
Report given to [place], RN on 5W.  Updated on patient history, current status, and plan of care.  Patient is stable and able to transfer at this time.

## 2014-05-02 NOTE — Evaluation (Signed)
Clinical/Bedside Swallow Evaluation Patient Details  Name: Joseph Ponce Sobocinski MRN: [id number] Date of Birth: 11-04-1924  Today's Date: 05/02/2014 Time: [id number] SLP Time Calculation (min): 25 min  Past Medical History:  Past Medical History  Diagnosis Date  . Dementia   . Muscle weakness   . Osteoporosis   . Alzheimer disease   . Failure to thrive in adult   . Dementia   . CVA (cerebral vascular accident)     speech and language d/o deficits  . Weakness   . Hyperlipemia   . Dysphagia   . Anemia   . PVD (peripheral vascular disease)   . COPD (chronic obstructive pulmonary disease)   . Glaucoma (increased eye pressure)   . Hypertension   . S/P BKA (below knee amputation) unilateral 01/21/2006    left  . Renal artery stenosis    Past Surgical History:  Past Surgical History  Procedure Laterality Date  . Leg amputation below knee      left   HPI:  Joseph Ponce Youman is a 80 y.o. male with Past medical history of dementia, CVA, failure to thrive, anemia, peripheral vascular disease, hypertension, left BKA, chronic hypernatremia. Patient presented from a nursing home. He was brought in because of altered mental status. At his baseline the patient is able to carry on a conversation. Since last one week he has been having progressive slowing and less complicated. He has been having generalized weakness. Today there was decreased consciousness as well. A chest x-ray was done which showed left-sided infiltrate and the patient was brought here as he was also having fevers too. Denies any nausea or vomiting. Speech bedside swallow eval ordered to aid in ruling out aspiration PNA.    Assessment / Plan / Recommendation Clinical Impression  Pt demonstrated overt s/s of aspiration following straw sip of thin liquid only; no signs following small sips from cup. Prolonged oral phase across consistencies with possible delay in swallow initiation; no oral residue apparent following swallow. Pt requested not  to try regular solid. Noted hx of dysphagia 2/ thin liquid from 2013 [facility] Recommend initiating this diet- dysphagia 2, thin liquids, no straws; meds whole in puree or crushed if difficulties are presented with whole. Given difficulty that pt had with straw sips, there appears to be a moderate risk of aspiration if pt is not supervised during meals- needs verbal and tactile cueing to take small sips at a time. ST will continue to follow to assess diet tolerance.     Aspiration Risk  Moderate    Diet Recommendation Dysphagia 2 (Fine chop);Thin liquid   Liquid Administration via: Cup;No straw Medication Administration: Whole meds with puree Supervision: Staff to assist with self feeding;Full supervision/cueing for compensatory strategies Compensations: Slow rate;Small sips/bites;Check for pocketing (needs max cues for small sips) Postural Changes and/or Swallow Maneuvers: Seated upright 90 degrees;Upright 30-60 min after meal    Other  Recommendations Oral Care Recommendations: Oral care BID   Follow Up Recommendations  Skilled Nursing facility    Frequency and Duration min 2x/week  2 weeks   Pertinent Vitals/Pain n/a    SLP Swallow Goals     Swallow Study Prior Functional Status       General HPI: Joseph Ponce Kuper is a 80 y.o. male with Past medical history of dementia, CVA, failure to thrive, anemia, peripheral vascular disease, hypertension, left BKA, chronic hypernatremia. Patient presented from a nursing home. He was brought in because of altered mental status. At his baseline  the patient is able to carry on a conversation. Since last one week he has been having progressive slowing and less complicated. He has been having generalized weakness. Today there was decreased consciousness as well. A chest x-ray was done which showed left-sided infiltrate and the patient was brought here as he was also having fevers too. Denies any nausea or vomiting. Speech  bedside swallow eval ordered to aid in ruling out aspiration PNA.  Type of Study: Bedside swallow evaluation Previous Swallow Assessment: [facility]BS in 2013- recommended dys 2/ thin liquid diet Diet Prior to this Study: NPO Temperature Spikes Noted: Yes Respiratory Status: Room air History of Recent Intubation: No Behavior/Cognition: Alert;Cooperative;Requires cueing;Decreased sustained attention Oral Cavity - Dentition: Edentulous Self-Feeding Abilities: Needs assist Patient Positioning: Upright in bed Baseline Vocal Quality: Low vocal intensity Volitional Cough: Strong Volitional Swallow: Unable to elicit    Oral/Motor/Sensory Function Overall Oral Motor/Sensory Function: Appears within functional limits for tasks assessed Labial ROM: Within Functional Limits Labial Symmetry: Within Functional Limits Labial Strength: Within Functional Limits Lingual ROM: Within Functional Limits Lingual Symmetry: Within Functional Limits Lingual Strength: Within Functional Limits   Ice Chips Ice chips: Not tested   Thin Liquid Thin Liquid: Impaired Presentation: Cup;Straw Oral Phase Functional Implications: Prolonged oral transit Pharyngeal  Phase Impairments: Suspected delayed Swallow;Cough - Immediate (cough following large straw sip)    Nectar Thick Nectar Thick Liquid: Not tested   Honey Thick Honey Thick Liquid: Not tested   Puree Puree: Within functional limits Presentation: Spoon   Solid   GO    Solid: Impaired Presentation: Spoon Oral Phase Impairments: Other (comment) (prolonged oral phase)       Ole[place]ak, Amy K 05/02/2014,3:39 PM

## 2014-05-02 NOTE — Progress Notes (Signed)
Utilization review completed.  

## 2014-05-02 NOTE — Progress Notes (Signed)
Attempted to call report to 5W.  RN was unavailable and asked to return call shortly.  Patient is stable at this time.  Will continue to monitor.

## 2014-05-02 NOTE — Progress Notes (Signed)
Attempted to call report to RN on 5W.  No answer.  Patient is stable at this time.  Will continue to monitor.

## 2014-05-02 NOTE — Progress Notes (Signed)
Joseph ReedyJohn Ponce [id number]  Transfer Data: 05/02/2014 11:08 AM  Attending Provider: Marinda ElkAbraham Feliz Ortiz, MD  Joseph Kellie ShropshireGAVIN, MD  Code Status: Full  Joseph ReedyJohn Ponce is a 80 y.o. male patient transferred from 2c  -No acute distress noted.  -No complaints of shortness of breath.  -No complaints of chest pain.  Cardiac Monitoring:  Box # 8 in place.    Blood pressure 164/48, pulse 76, temperature 97.4 F (36.3 C), temperature source Oral, resp. rate 21, height 6' (1.829 m), weight 72.7 kg (160 lb 4.4 oz), SpO2 95.00%.  ?  IV Fluids: IV in place, occlusive dsg intact without redness, IV cath forearm right, condition patent and no redness  D5W.  Allergies: Review of patient's allergies indicates no known allergies.  Past Medical History:  has a past medical history of Dementia; Muscle weakness; Osteoporosis; Alzheimer disease; Failure to thrive in adult; Dementia; CVA (cerebral vascular accident); Weakness; Hyperlipemia; Dysphagia; Anemia; PVD (peripheral vascular disease); COPD (chronic obstructive pulmonary disease); Glaucoma (increased eye pressure); Hypertension; S/P BKA (below knee amputation) unilateral (01/21/2006); and Renal artery stenosis.  Past Surgical History:  has past surgical history that includes Leg amputation below knee.  Social History:  reports that he has quit smoking. He does not have any smokeless tobacco history on file. He reports that he does not drink alcohol or use illicit drugs.  Skin: intact   Patient/Family orientated to room. Information packet given to patient/family. Admission inpatient armband information verified with patient/family to include name and date of birth and placed on patient arm. Side rails up x 2, fall assessment and education completed with patient/family. Patient/family able to verbalize understanding of risk associated with falls and verbalized understanding to call for assistance before getting out of bed. Call light within reach. Patient/family  able to voice and demonstrate understanding of unit orientation instructions.  Will continue to evaluate and treat per MD orders.

## 2014-05-02 NOTE — Progress Notes (Addendum)
[facility] PROGRESS NOTE   Assessment/Plan: HCAP (healthcare-associated pneumonia) - has defervesce on Vanc and cefepime started on 6.24.2015. - VS stable. UC pending. Cont IV fluid. - transfer to regular bed.  Chronic pain syndrome: - Hold narcotics.  Normocytic  Anemia: - Repeated Hbg 11.8, after 1 unit of PRBC. He has always been 11-10. That hbg of 6 was an erroneous value. - No prior history of anticoagulation. Nursing home denies any active bleeding or any black or bowel movements.  - Hemoccult is negative. anemia panel pending. - Resume aspirin and Plavix .  Acute encephalopathy/ Dementia without behavioral disturbance: - unclear of baseline. - ? Due to infectious etiology. - he does have a history of dementia. - able to answer yes an no question and follow commands.  Chronic pain.  - Patient is unremarkable in the nursing home twice a day scheduled and every 6 hours as needed. Cont hold it due to his presentation of acute mental status change   Chronic hypernatremia/Chronic kidney disease  - Change IV fluid to D5W. - Check b-met, hold diuretics.  Code Status: full Family Communication: none  Disposition Plan: inpatient, transfer to med-surg.   Consultants:  none  Procedures:  CXR CT head 6.25.2015: No acute intracranial findings. No change from prior.  Extensive cortical atrophy.  Extensive periventricular and subcortical white matter microvascular disease.    Antibiotics:  vanc and cefepime 6.24.2015  HPI/Subjective: No complains only answer yes and no questions  Objective: Filed Vitals:   05/02/14 0045 05/02/14 0056 05/02/14 0115 05/02/14 0400  BP: 139/61   126/47  Pulse: 57     Temp: 98 F (36.7 C) 98.2 F (36.8 C) 98.2 F (36.8 C) 98.2 F (36.8 C)  TempSrc: Oral Oral Oral Oral  Resp: 19   20  Height:      Weight:    72.7 kg (160 lb 4.4 oz)  SpO2: 100%       Intake/Output Summary (Last 24 hours) at 05/02/14 0733 Last data  filed at 05/02/14 0600  Gross per 24 hour  Intake 1720.83 ml  Output    225 ml  Net 1495.83 ml   Filed Weights   05/01/14 2109 05/02/14 0400  Weight: 72.7 kg (160 lb 4.4 oz) 72.7 kg (160 lb 4.4 oz)    Exam:  General: , in no acute distress.  HEENT: No bruits, no goiter.  Heart: Regular rate and rhythm. Lungs: Good air movement, clear Abdomen: Soft, nontender, nondistended, positive bowel sounds.     Data Reviewed: Basic Metabolic Panel:  Recent Labs Lab 05/01/14 1713  NA 153*  K 4.6  CL 116*  CO2 25  GLUCOSE 120*  BUN 55*  CREATININE 2.86*  CALCIUM 9.5   Liver Function Tests:  Recent Labs Lab 05/01/14 1713  AST 50*  ALT 39  ALKPHOS 80  BILITOT <0.2*  PROT 6.3  ALBUMIN 2.3*   No results found for this basename: LIPASE, AMYLASE,  in the last 168 hours No results found for this basename: AMMONIA,  in the last 168 hours CBC:  Recent Labs Lab 05/01/14 1713 05/02/14 0530  WBC 15.0* 15.2*  NEUTROABS 11.2* 11.6*  HGB 6.4* 11.8*  HCT 20.3* 37.2*  MCV 86.0 85.9  PLT 247 212   Cardiac Enzymes: No results found for this basename: CKTOTAL, CKMB, CKMBINDEX, TROPONINI,  in the last 168 hours BNP (last 3 results) No results found for this basename: PROBNP,  in the last 8760 hours CBG: No results found  for this basename: GLUCAP,  in the last 168 hours  Recent Results (from the past 240 hour(s))  MRSA PCR SCREENING     Status: None   Collection Time    05/01/14 10:18 PM      Result Value Ref Range Status   MRSA by PCR NEGATIVE  NEGATIVE Final   Comment:            The GeneXpert MRSA Assay (FDA     approved for NASAL specimens     only), is one component of a     comprehensive MRSA colonization     surveillance program. It is not     intended to diagnose MRSA     infection nor to guide or     monitor treatment for     MRSA infections.     Studies: Ct Head Wo Contrast  05/01/2014   CLINICAL DATA:  Altered mental status with decreased level  consciousness.  EXAM: CT HEAD WITHOUT CONTRAST  TECHNIQUE: Contiguous axial images were obtained from the base of the skull through the vertex without intravenous contrast.  COMPARISON:  CT 01/30/2014  FINDINGS: No acute intracranial hemorrhage. No focal mass lesion. No CT evidence of acute infarction. No midline shift or mass effect. No hydrocephalus. Basilar cisterns are patent.  There is severe cortical atrophy with a frontal lobe predominance not changed from prior. There is ventricular dilatation proportional to the cortical atrophy. No periventricular subcortical white matter hypodensities.  Paranasal sinuses and  mastoid air cells are clear.  IMPRESSION: 1. No acute intracranial findings.  No change from prior. 2. Extensive cortical atrophy. 3. Extensive periventricular and subcortical white matter microvascular disease.   Electronically Signed   By: Suzy Bouchard M.D.   On: 05/01/2014 19:19   Dg Chest Port 1 View  05/01/2014   CLINICAL DATA:  Pneumonia.  EXAM: PORTABLE CHEST - 1 VIEW  COMPARISON:  01/31/2014  FINDINGS: There is a new patchy infiltrate in the left lower lobe. Heart size is normal. There is tortuosity of thoracic aorta. There is chronic accentuation of the interstitial markings at the right lung base. Pulmonary vascularity is normal. No effusions. No acute osseous abnormality. Multiple old healed left posterior lateral rib fractures.  IMPRESSION: Acute infiltrate in the left lower lobe.   Electronically Signed   By: Rozetta Nunnery M.D.   On: 05/01/2014 18:20    Scheduled Meds: . antiseptic oral rinse  15 mL Mouth Rinse BID  . ceFEPime (MAXIPIME) IV  1 g Intravenous Q24H  . [START ON 05/03/2014] vancomycin  1,000 mg Intravenous Q48H   Continuous Infusions: . sodium chloride 100 mL/hr at 05/01/14 2255     Charlynne Cousins  [facility] Pager [phone]. If 8PM-8AM, please contact night-coverage at [email or web address], password [facility] 05/02/2014, 7:33 AM  LOS: 1 day       **Disclaimer: This note may have been dictated with voice recognition software. Similar sounding words can inadvertently be transcribed and this note may contain transcription errors which may not have been corrected upon publication of note.**

## 2014-05-02 NOTE — Progress Notes (Signed)
Pt showing ventricular tri. on tele. MD Feliz aware. Per telephone, to d/c monitor. MD also aware of increased BP's. Will continue to monitor.

## 2014-05-02 NOTE — Progress Notes (Signed)
INITIAL NUTRITION ASSESSMENT  DOCUMENTATION CODES Per approved criteria  -Not Applicable   INTERVENTION: Consider Speech Path consult for swallow evaluation prior to PO diet advancement RD to follow for nutrition care plan, add supplements accordingly  NUTRITION DIAGNOSIS: Inadequate oral intake related to inability to eat as evidenced by NPO status  Goal: Pt to meet >/= 90% of their estimated nutrition needs   Monitor:  PO diet advancement & intake, weight, labs, I/O's  Reason for Assessment: Malnutrition Screening Tool Report  80 y.o. male  Admitting Dx: HCAP (healthcare-associated pneumonia)  ASSESSMENT: 80 y.o. male with PMH of dementia, CVA, failure to thrive, anemia, peripheral vascular disease, hypertension, left BKA, chronic hypernatremia; presented with altered mental status; chest X-ray showed left-sided infiltrate.  RD unable to obtain nutrition hx from patient; no family at bedside; [facility] pt takes Prostat liquid protein BID and Resource Breeze BID between meals; per wt readings, pt has had a 4% weight loss since April 2015, however, not significant for time frame.  Noted + hx of dysphagia; may benefit from swallow re-evaluation prior to PO diet advancement.  RD unable to complete Nutrition Focused Physical Exam at this time.  Height: Ht Readings from Last 1 Encounters:  05/01/14 6' (1.829 m)    Weight: Wt Readings from Last 1 Encounters:  05/02/14 160 lb 4.4 oz (72.7 kg)    Ideal Body Weight: 167 lb -- adjusted for BKA  % Ideal Body Weight: 95%  Wt Readings from Last 10 Encounters:  05/02/14 160 lb 4.4 oz (72.7 kg)  03/04/14 167 lb (75.751 kg)  01/28/14 161 lb (73.029 kg)  08/31/13 170 lb (77.111 kg)  08/03/13 177 lb (80.287 kg)  05/18/13 178 lb (80.74 kg)  04/27/13 173 lb (78.472 kg)  05/18/13 176 lb (79.833 kg)  03/23/13 176 lb (79.833 kg)  01/26/13 169 lb (76.658 kg)    Usual Body Weight: 167 lb --  April 2015  % Usual Body Weight: 95%  BMI:  22.9 kg/m2 -- adjusted for BKA  Estimated Nutritional Needs: Kcal: 1600-1800 Protein: 80-90 gm Fluid: 1.6-1.8 L  Skin: Stage I pressure ulcer to sacrum   Diet Order: NPO  EDUCATION NEEDS: -No education needs identified at this time   Intake/Output Summary (Last 24 hours) at 05/02/14 1131 Last data filed at 05/02/14 0803  Gross per 24 hour  Intake 1925.83 ml  Output    225 ml  Net 1700.83 ml    Labs:   Recent Labs Lab 05/01/14 1713 05/02/14 0530  NA 153* 153*  K 4.6 4.5  CL 116* 117*  CO2 25 19  BUN 55* 49*  CREATININE 2.86* 2.40*  CALCIUM 9.5 9.8  GLUCOSE 120* 115*    Scheduled Meds: . amLODipine  10 mg Oral Daily  . antiseptic oral rinse  15 mL Mouth Rinse BID  . aspirin  81 mg Oral Daily  . brimonidine  1 drop Both Eyes TID  . ceFEPime (MAXIPIME) IV  1 g Intravenous Q24H  . clopidogrel  75 mg Oral Daily  . desmopressin  1 spray Nasal TID  . dorzolamide  1 drop Both Eyes BID  . feeding supplement (PRO-STAT SUGAR FREE 64)  30 mL Oral BID  . hydroxypropyl methylcellulose  1 drop Both Eyes TID  . latanoprost  1 drop Both Eyes QHS  . polyethylene glycol  17 g Oral Daily  . [START ON 05/03/2014] vancomycin  1,000 mg Intravenous Q48H    Continuous Infusions: . dextrose  100 mL/hr at 05/02/14 [id number]    Past Medical History  Diagnosis Date  . Dementia   . Muscle weakness   . Osteoporosis   . Alzheimer disease   . Failure to thrive in adult   . Dementia   . CVA (cerebral vascular accident)     speech and language d/o deficits  . Weakness   . Hyperlipemia   . Dysphagia   . Anemia   . PVD (peripheral vascular disease)   . COPD (chronic obstructive pulmonary disease)   . Glaucoma (increased eye pressure)   . Hypertension   . S/P BKA (below knee amputation) unilateral 01/21/2006    left  . Renal artery stenosis     Past Surgical History  Procedure Laterality Date  . Leg amputation below knee       left    Maureen ChattersKatie Lamberton, RD, LDN Pager #: [phone] After-Hours Pager #: [phone]

## 2014-05-03 DIAGNOSIS — A419 Sepsis, unspecified organism: Secondary | ICD-10-CM | POA: Diagnosis present

## 2014-05-03 LAB — LEGIONELLA ANTIGEN, URINE: Legionella Antigen, Urine: NEGATIVE

## 2014-05-03 LAB — BASIC METABOLIC PANEL
BUN: 44 mg/dL — ABNORMAL HIGH (ref 6–23)
CO2: 25 mEq/L (ref 19–32)
Calcium: 9.4 mg/dL (ref 8.4–10.5)
Chloride: 112 mEq/L (ref 96–112)
Creatinine, Ser: 2.03 mg/dL — ABNORMAL HIGH (ref 0.50–1.35)
GFR calc non Af Amer: 28 mL/min — ABNORMAL LOW (ref >90)
GFR, EST AFRICAN AMERICAN: 32 mL/min — AB (ref >90)
GLUCOSE: 137 mg/dL — AB (ref 70–99)
POTASSIUM: 3.7 meq/L (ref 3.7–5.3)
SODIUM: 150 meq/L — AB (ref 137–147)

## 2014-05-03 MED ORDER — VANCOMYCIN HCL IN DEXTROSE 750-5 MG/150ML-% IV SOLN
750.0000 mg | INTRAVENOUS | Status: DC
Start: 2014-05-03 — End: 2014-05-04
  Administered 2014-05-03: 750 mg via INTRAVENOUS
  Filled 2014-05-03 (×2): qty 150

## 2014-05-03 NOTE — Clinical Documentation Improvement (Signed)
  Query 1 of 2 Clinical Indicators:  "Sepsis d/t HCAP" documented in ED physician H&P  Rectal temp in ED - 101.3  WBC 15.0 with mild left shift on admission   Possible Clinical Conditions:   - Sepsis Present on Admission 2/2 HCAP   - Other Condition   - Unable to Clinically Determine   Query 2 of 2  Clinical Indicators:  Stage 1 pressure ulcer on sacrum per nursing note and Doc Flowsheets   "no sacral wounds noted" documented in ED Physican Note   Please document in the progress notes and discharge summary, if you agree with the nursing assessment of the Stage 1 sacral pressure ulcer.  Please include if the pressure was Present on Admission.   Thank You,  Jerral Ralphathy R Stevens ,RN BSN CCDS Certified Clinical Documentation Specialist:  [phone] [facility]- Health Information Management

## 2014-05-03 NOTE — Progress Notes (Signed)
ANTIBIOTIC CONSULT NOTE - FOLLOW UP  Pharmacy Consult for Vancomycin Indication: pneumonia  No Known Allergies  Patient Measurements: Height: 6' (182.9 cm) Weight: 160 lb 4.4 oz (72.7 kg) IBW/kg (Calculated) : 77.6 Adjusted Body Weight:   Vital Signs: Temp: 99.5 F (37.5 C) (06/26 0509) Temp src: Oral (06/26 0509) BP: 170/62 mmHg (06/26 0949) Pulse Rate: 62 (06/26 0509) Intake/Output from previous day: 06/25 0701 - 06/26 0700 In: 2420 [P.O.:30; I.V.:2340; IV Piggyback:50] Out: 400 [Urine:400] Intake/Output from this shift: Total I/O In: 220 [I.V.:220] Out: 200 [Urine:200]  Labs:  Recent Labs  05/01/14 1713 05/02/14 0530 05/03/14 0705  WBC 15.0* 15.2*  --   HGB 6.4* 11.8*  --   PLT 247 212  --   CREATININE 2.86* 2.40* 2.03*   Estimated Creatinine Clearance: 25.9 ml/min (by C-G formula based on Cr of 2.03). No results found for this basename: VANCOTROUGH, VANCOPEAK, VANCORANDOM, GENTTROUGH, GENTPEAK, GENTRANDOM, TOBRATROUGH, TOBRAPEAK, TOBRARND, AMIKACINPEAK, AMIKACINTROU, AMIKACIN,  in the last 72 hours   Microbiology: Recent Results (from the past 720 hour(s))  CULTURE, BLOOD (ROUTINE X 2)     Status: None   Collection Time    05/01/14  5:15 PM      Result Value Ref Range Status   Specimen Description BLOOD RIGHT FOREARM   Final   Special Requests BOTTLES DRAWN AEROBIC AND ANAEROBIC 5CC   Final   Culture  Setup Time     Final   Value: 05/02/2014 01:04     Performed at [facility]   Culture     Final   Value:        BLOOD CULTURE RECEIVED NO GROWTH TO DATE CULTURE WILL BE HELD FOR 5 DAYS BEFORE ISSUING A FINAL NEGATIVE REPORT     Performed at [facility]   Report Status PENDING   Incomplete  CULTURE, BLOOD (ROUTINE X 2)     Status: None   Collection Time    05/01/14  5:35 PM      Result Value Ref Range Status   Specimen Description BLOOD RIGHT HAND   Final   Special Requests BOTTLES DRAWN AEROBIC AND ANAEROBIC 5CC   Final   Culture   Setup Time     Final   Value: 05/02/2014 01:03     Performed at [facility]   Culture     Final   Value:        BLOOD CULTURE RECEIVED NO GROWTH TO DATE CULTURE WILL BE HELD FOR 5 DAYS BEFORE ISSUING A FINAL NEGATIVE REPORT     Performed at [facility]   Report Status PENDING   Incomplete  URINE CULTURE     Status: None   Collection Time    05/01/14  8:06 PM      Result Value Ref Range Status   Specimen Description URINE, CATHETERIZED   Final   Special Requests NONE   Final   Culture  Setup Time     Final   Value: 05/02/2014 01:10     Performed at [facility]   Colony Count     Final   Value: 70,000 COLONIES/ML     Performed at [facility]   Culture     Final   Value: Multiple bacterial morphotypes present, none predominant. Suggest appropriate recollection if clinically indicated.     Performed at [facility]   Report Status 05/02/2014 FINAL   Final  MRSA PCR SCREENING     Status: None  Collection Time    05/01/14 10:18 PM      Result Value Ref Range Status   MRSA by PCR NEGATIVE  NEGATIVE Final   Comment:            The GeneXpert MRSA Assay (FDA     approved for NASAL specimens     only), is one component of a     comprehensive MRSA colonization     surveillance program. It is not     intended to diagnose MRSA     infection nor to guide or     monitor treatment for     MRSA infections.    Anti-infectives   Start     Dose/Rate Route Frequency Ordered Stop   05/03/14 1730  vancomycin (VANCOCIN) IVPB 1000 mg/200 mL premix     1,000 mg 200 mL/hr over 60 Minutes Intravenous Every 48 hours 05/01/14 2145     05/02/14 1800  ceFEPIme (MAXIPIME) 1 g in dextrose 5 % 50 mL IVPB     1 g 100 mL/hr over 30 Minutes Intravenous Every 24 hours 05/01/14 2132 05/10/14 1759   05/02/14 0600  ceFEPIme (MAXIPIME) 2 g in dextrose 5 % 50 mL IVPB  Status:  Discontinued     2 g 100 mL/hr over 30 Minutes Intravenous Every 12 hours 05/01/14 2106  05/01/14 2136   05/01/14 2200  ceFEPIme (MAXIPIME) 2 g in dextrose 5 % 50 mL IVPB  Status:  Discontinued     2 g 100 mL/hr over 30 Minutes Intravenous Every 12 hours 05/01/14 1737 05/01/14 2106   05/01/14 1745  vancomycin (VANCOCIN) IVPB 1000 mg/200 mL premix     1,000 mg 200 mL/hr over 60 Minutes Intravenous  Once 05/01/14 1737 05/01/14 1911      Assessment: 80yo male with pneumonia and sepsis on Vancomycin and Cefepime day#3.  Cr has been improving.  Blood cx are NTD  Goal of Therapy:  Vancomycin trough level 15-20 mcg/ml  Plan:  1-  Change Vancomycin to 750mg  IV q24 2-  Continue Cefepime 1g IV q24 3-  F/U blood cx 4-  Cont to follow renal fxn, clinical status 5-  SS trough when appropriate  Marisue HumbleKendra Hiatt, PharmD Clinical Pharmacist  System- [facility]

## 2014-05-03 NOTE — Progress Notes (Signed)
Speech Language Pathology Treatment: Dysphagia  Patient Details Name: Joseph ReedyJohn Ponce MRN: [id number] DOB: 1924-02-09 Today's Date: 05/03/2014 Time: [id number] SLP Time Calculation (min): 16 min  Assessment / Plan / Recommendation Clinical Impression  Pt demonstrated no overt s/s of aspiration at bedside today; appears to be tolerating dysphagia 2/ thin liquid diet well. Subjectively more alert today and required less cueing. Recommend continuing with this diet (dys 2/ thin liquids) and continue full supervision to cue pt to take small sips. Continue with no straws. ST will continue to follow for diet tolerance/ advancement.    HPI HPI: Joseph Ponce is a 80 y.o. male with Past medical history of dementia, CVA, failure to thrive, anemia, peripheral vascular disease, hypertension, left BKA, chronic hypernatremia. Patient presented from a nursing home. He was brought in because of altered mental status. At his baseline the patient is able to carry on a conversation. Since last one week he has been having progressive slowing and less complicated. He has been having generalized weakness. Today there was decreased consciousness as well. A chest x-ray was done which showed left-sided infiltrate and the patient was brought here as he was also having fevers too. Denies any nausea or vomiting. Speech bedside swallow eval ordered to aid in ruling out aspiration PNA.    Pertinent Vitals n/a  SLP Plan  Continue with current plan of care    Recommendations Diet recommendations: Dysphagia 2 (fine chop);Thin liquid Liquids provided via: Cup;No straw Medication Administration: Whole meds with puree Supervision: Staff to assist with self feeding;Full supervision/cueing for compensatory strategies Compensations: Slow rate;Small sips/bites;Check for pocketing Postural Changes and/or Swallow Maneuvers: Seated upright 90 degrees;Upright 30-60 min after meal              Oral Care Recommendations: Oral care  BID Follow up Recommendations: Skilled Nursing facility Plan: Continue with current plan of care    GO     Metro KungOleksiak, Joseph K, MA, CCC-SLP 05/03/2014, 1:26 PM

## 2014-05-03 NOTE — Care Management Note (Signed)
    Page 1 of 1   05/03/2014     5:50:47 PM CARE MANAGEMENT NOTE 05/03/2014  Patient:  Joseph Ponce,Joseph Ponce   Account Number:  [id number]  Date Initiated:  05/03/2014  Documentation initiated by:  Letha CapeAYLOR,DEBORAH  Subjective/Objective Assessment:   dx pna  [facility]     Action/Plan:   Anticipated DC Date:  05/05/2014   Anticipated DC Plan:  SKILLED NURSING FACILITY  In-house referral  Clinical Social Worker         Choice offered to / List presented to:             Status of service:  In process, will continue to follow Medicare Important Message given?  YES (If response is "NO", the following Medicare IM given date fields will be blank) Date Medicare IM given:  05/03/2014 Date Additional Medicare IM given:    Discharge Disposition:    Per UR Regulation:    If discussed at Long Length of Stay Meetings, dates discussed:    Comments:

## 2014-05-04 LAB — BASIC METABOLIC PANEL
BUN: 35 mg/dL — AB (ref 6–23)
CHLORIDE: 107 meq/L (ref 96–112)
CO2: 25 mEq/L (ref 19–32)
Calcium: 8.7 mg/dL (ref 8.4–10.5)
Creatinine, Ser: 1.77 mg/dL — ABNORMAL HIGH (ref 0.50–1.35)
GFR calc non Af Amer: 33 mL/min — ABNORMAL LOW (ref >90)
GFR, EST AFRICAN AMERICAN: 38 mL/min — AB (ref >90)
Glucose, Bld: 136 mg/dL — ABNORMAL HIGH (ref 70–99)
POTASSIUM: 3.7 meq/L (ref 3.7–5.3)
Sodium: 145 mEq/L (ref 137–147)

## 2014-05-04 MED ORDER — HYDROCODONE-ACETAMINOPHEN 5-325 MG PO TABS: ORAL_TABLET | ORAL | Status: DC

## 2014-05-04 MED ORDER — LEVOFLOXACIN 750 MG PO TABS: 750.0000 mg | ORAL_TABLET | Freq: Every day | ORAL | Status: DC

## 2014-05-04 NOTE — Progress Notes (Signed)
Notified Crystal, SW that patient is discharge.

## 2014-05-04 NOTE — Clinical Social Work Note (Signed)
CSW made aware by RN patient being d/c to [facility]. CSW contacted facility and confirmed patient return. CSW met with patient who was pleasantly confused. CSW contacted patient's daughter Consuello Masse and made her aware of patient's d/c. Patient's daughter is agreeable to d/c. CSW faxed d/c summary to facility. CSW made RN aware of report and room number. CSW prepared d/c packet and placed in patient's shadow chart. CSW to arrange transportation via [place]. No further needs. CSW signing off.  [place], [place] Weekend Clinical Social Worker [phone]

## 2014-05-04 NOTE — Progress Notes (Deleted)
Physician Discharge Summary  Joseph Ponce WJX:[id number] DOB: Oct 03, 1924 DOA: 05/01/2014  PCP: [name], MD  Admit date: 05/01/2014 Discharge date: 05/04/2014  Time spent: 35 minutes  Recommendations for Outpatient Follow-up:  1. Follow up with PCP in 2 weeks.  Discharge Diagnoses:  Principal Problem:   HCAP (healthcare-associated pneumonia) Active Problems:   Acute encephalopathy   Dementia without behavioral disturbance   Anemia   S/P BKA (below knee amputation) unilateral   Hypertension   Dysphagia   Hyperlipemia   Chronic pain syndrome   Sepsis   Discharge Condition: stable  Diet recommendation: Dys 2  Filed Weights   05/01/14 2109 05/02/14 0400  Weight: 72.7 kg (160 lb 4.4 oz) 72.7 kg (160 lb 4.4 oz)    History of present illness:  80 y.o. male with Past medical history of dementia, CVA, failure to thrive, anemia, peripheral vascular disease, hypertension, left BKA, chronic hypernatremia.  Patient presented from a nursing home. He was brought in because of altered mental status. At his baseline the patient is able to carry on a conversation. Since last one week he has been having progressive slowing and less complicated. He has been having generalized weakness. Today there was decreased consciousness as well. A chest x-ray was done which showed left-sided infiltrate and the patient was brought here as he was also having fevers too.  At the time of my evaluation the patient has improved significantly from his prior arrival. He has been able to carry on a conversation denies any active pain in his chest or abdomen. Complains of some pain in his back.. Is not able to provide any prior history has been ongoing since last few days. Denies any nausea or vomiting.  He feels constipated and does not have any focal neurological deficit as per him. He has chronic contracture on the right side.   Hospital Course:  HCAP (healthcare-associated pneumonia); - started on  empiric antibiotics. - Has defervesce on Vanc and cefepime started on 6.24.2015.  - VS stable. UC negative, de-escalate to Levaquin for 5 additional days.  Chronic pain syndrome:  - Held narcotics as he was confused. - resume as an outpatient.   Normocytic Anemia:  - Repeated Hbg 11.8, after 1 unit of PRBC. He has always been 11-10. That hbg of 6 was an erroneous value.  - No prior history of anticoagulation. Nursing home denies any active bleeding or any black or bowel movements.  - Hemoccult is negative.  - Cont aspirin and Plavix .   Acute encephalopathy/ Dementia without behavioral disturbance:  - unclear of baseline.  - ? Due to infectious etiology and narcotics.  Chronic hypernatremia/Chronic kidney disease  - Start NS IV fluid changed to D5W.  - resolved..   Procedures:  CT head  Consultations:  none  Discharge Exam: Filed Vitals:   05/04/14 0418  BP: 172/70  Pulse: 51  Temp: 96 F (35.6 C)  Resp: 16    General: A&O x3 Cardiovascular: RRR Respiratory: good air movement CTA B/L  Discharge Instructions You were cared for by a hospitalist during your hospital stay. If you have any questions about your discharge medications or the care you received while you were in the hospital after you are discharged, you can call the unit and asked to speak with the hospitalist on call if the hospitalist that took care of you is not available. Once you are discharged, your primary care physician will handle any further medical issues. Please note that NO REFILLS for any discharge  medications will be authorized once you are discharged, as it is imperative that you return to your primary care physician (or establish a relationship with a primary care physician if you do not have one) for your aftercare needs so that they can reassess your need for medications and monitor your lab values.      Discharge Instructions   Diet - low sodium heart healthy    Complete by:  As directed       Diet - low sodium heart healthy    Complete by:  As directed      Increase activity slowly    Complete by:  As directed      Increase activity slowly    Complete by:  As directed             Medication List         amLODipine 10 MG tablet  Commonly known as:  NORVASC  Take 10 mg by mouth daily.     aspirin 81 MG chewable tablet  Chew 81 mg by mouth daily.     brimonidine 0.1 % Soln  Commonly known as:  ALPHAGAN P  Place 1 drop into both eyes 2 (two) times daily.     calcium-vitamin D 500-200 MG-UNIT per tablet  Commonly known as:  OSCAL WITH D  Take 1 tablet by mouth 2 (two) times daily.     cephALEXin 500 MG capsule  Commonly known as:  KEFLEX  Take 1 capsule (500 mg total) by mouth 4 (four) times daily.     CERTA-VITE PO  Take 1 tablet by mouth daily.     clopidogrel 75 MG tablet  Commonly known as:  PLAVIX  Take 75 mg by mouth daily.     denosumab 60 MG/ML Soln injection  Commonly known as:  PROLIA  Inject 60 mg into the skin every 6 (six) months.     desmopressin 0.01 % nasal solution  Commonly known as:  DDAVP  Place 10 mcg into the nose 3 (three) times daily.     dorzolamide 2 % ophthalmic solution  Commonly known as:  TRUSOPT  Place 1 drop into both eyes 2 (two) times daily.     feeding supplement (PRO-STAT SUGAR FREE 64) Liqd  Take 30 mLs by mouth 2 (two) times daily.     feeding supplement (RESOURCE BREEZE) Liqd  Take 1 Container by mouth 2 (two) times daily between meals.     gabapentin 100 MG capsule  Commonly known as:  NEURONTIN  Take 100 mg by mouth 2 (two) times daily.     HYDROcodone-acetaminophen 5-325 MG per tablet  Commonly known as:  NORCO/VICODIN  Take one tablet by mouth twice daily for pain; Take one tablet by mouth every 6 hours as needed for pain     hydroxypropyl methylcellulose 2.5 % ophthalmic solution  Commonly known as:  ISOPTO TEARS  Place 1 drop into both eyes 3 (three) times daily.     levofloxacin 750 MG  tablet  Commonly known as:  LEVAQUIN  Take 1 tablet (750 mg total) by mouth daily.     magnesium hydroxide 400 MG/5ML suspension  Commonly known as:  MILK OF MAGNESIA  Take 30 mLs by mouth daily as needed. For constipation     NAMENDA XR 28 MG Cp24  Generic drug:  Memantine HCl ER  Take 28 mg by mouth daily.     nitroGLYCERIN 0.2 mg/hr patch  Commonly known as:  NITRODUR - Dosed in  mg/24 hr  Place 1 patch onto the skin daily.     polyethylene glycol packet  Commonly known as:  MIRALAX / GLYCOLAX  Take 17 g by mouth daily.     Travoprost (BAK Free) 0.004 % Soln ophthalmic solution  Commonly known as:  TRAVATAN  Place 1 drop into both eyes at bedtime.       No Known Allergies Follow-up Information   Follow up with [name], MD In 2 weeks. (hospital follow up)    Specialty:  Internal Medicine   Contact information:   [address] [place] [address] [phone]        The results of significant diagnostics from this hospitalization (including imaging, microbiology, ancillary and laboratory) are listed below for reference.    Significant Diagnostic Studies: Ct Head Wo Contrast  05/01/2014   CLINICAL DATA:  Altered mental status with decreased level consciousness.  EXAM: CT HEAD WITHOUT CONTRAST  TECHNIQUE: Contiguous axial images were obtained from the base of the skull through the vertex without intravenous contrast.  COMPARISON:  CT 01/30/2014  FINDINGS: No acute intracranial hemorrhage. No focal mass lesion. No CT evidence of acute infarction. No midline shift or mass effect. No hydrocephalus. Basilar cisterns are patent.  There is severe cortical atrophy with a frontal lobe predominance not changed from prior. There is ventricular dilatation proportional to the cortical atrophy. No periventricular subcortical white matter hypodensities.  Paranasal sinuses and  mastoid air cells are clear.  IMPRESSION: 1. No acute intracranial findings.  No change from  prior. 2. Extensive cortical atrophy. 3. Extensive periventricular and subcortical white matter microvascular disease.   Electronically Signed   By: Genevive Bi M.D.   On: 05/01/2014 19:19   Dg Chest Port 1 View  05/01/2014   CLINICAL DATA:  Pneumonia.  EXAM: PORTABLE CHEST - 1 VIEW  COMPARISON:  01/31/2014  FINDINGS: There is a new patchy infiltrate in the left lower lobe. Heart size is normal. There is tortuosity of thoracic aorta. There is chronic accentuation of the interstitial markings at the right lung base. Pulmonary vascularity is normal. No effusions. No acute osseous abnormality. Multiple old healed left posterior lateral rib fractures.  IMPRESSION: Acute infiltrate in the left lower lobe.   Electronically Signed   By: Geanie Cooley M.D.   On: 05/01/2014 18:20    Microbiology: Recent Results (from the past 240 hour(s))  CULTURE, BLOOD (ROUTINE X 2)     Status: None   Collection Time    05/01/14  5:15 PM      Result Value Ref Range Status   Specimen Description BLOOD RIGHT FOREARM   Final   Special Requests BOTTLES DRAWN AEROBIC AND ANAEROBIC 5CC   Final   Culture  Setup Time     Final   Value: 05/02/2014 01:04     Performed at [facility]   Culture     Final   Value:        BLOOD CULTURE RECEIVED NO GROWTH TO DATE CULTURE WILL BE HELD FOR 5 DAYS BEFORE ISSUING A FINAL NEGATIVE REPORT     Performed at [facility]   Report Status PENDING   Incomplete  CULTURE, BLOOD (ROUTINE X 2)     Status: None   Collection Time    05/01/14  5:35 PM      Result Value Ref Range Status   Specimen Description BLOOD RIGHT HAND   Final   Special Requests BOTTLES DRAWN AEROBIC AND ANAEROBIC 5CC  Final   Culture  Setup Time     Final   Value: 05/02/2014 01:03     Performed at [facility]   Culture     Final   Value:        BLOOD CULTURE RECEIVED NO GROWTH TO DATE CULTURE WILL BE HELD FOR 5 DAYS BEFORE ISSUING A FINAL NEGATIVE REPORT     Performed at [facility]   Report Status PENDING   Incomplete  URINE CULTURE     Status: None   Collection Time    05/01/14  8:06 PM      Result Value Ref Range Status   Specimen Description URINE, CATHETERIZED   Final   Special Requests NONE   Final   Culture  Setup Time     Final   Value: 05/02/2014 01:10     Performed at [facility] Count     Final   Value: 70,000 COLONIES/ML     Performed at [facility]   Culture     Final   Value: Multiple bacterial morphotypes present, none predominant. Suggest appropriate recollection if clinically indicated.     Performed at [facility]   Report Status 05/02/2014 FINAL   Final  MRSA PCR SCREENING     Status: None   Collection Time    05/01/14 10:18 PM      Result Value Ref Range Status   MRSA by PCR NEGATIVE  NEGATIVE Final   Comment:            The GeneXpert MRSA Assay (FDA     approved for NASAL specimens     only), is one component of a     comprehensive MRSA colonization     surveillance program. It is not     intended to diagnose MRSA     infection nor to guide or     monitor treatment for     MRSA infections.     Labs: Basic Metabolic Panel:  Recent Labs Lab 05/01/14 1713 05/02/14 0530 05/03/14 0705 05/04/14 0443  NA 153* 153* 150* 145  K 4.6 4.5 3.7 3.7  CL 116* 117* 112 107  CO2 25 19 25 25   GLUCOSE 120* 115* 137* 136*  BUN 55* 49* 44* 35*  CREATININE 2.86* 2.40* 2.03* 1.77*  CALCIUM 9.5 9.8 9.4 8.7   Liver Function Tests:  Recent Labs Lab 05/01/14 1713 05/02/14 0530  AST 50* 34  ALT 39 35  ALKPHOS 80 90  BILITOT <0.2* 0.8  PROT 6.3 7.3  ALBUMIN 2.3* 2.4*   No results found for this basename: LIPASE, AMYLASE,  in the last 168 hours No results found for this basename: AMMONIA,  in the last 168 hours CBC:  Recent Labs Lab 05/01/14 1713 05/02/14 0530  WBC 15.0* 15.2*  NEUTROABS 11.2* 11.6*  HGB 6.4* 11.8*  HCT 20.3* 37.2*  MCV 86.0 85.9  PLT 247 212   Cardiac  Enzymes: No results found for this basename: CKTOTAL, CKMB, CKMBINDEX, TROPONINI,  in the last 168 hours BNP: BNP (last 3 results) No results found for this basename: PROBNP,  in the last 8760 hours CBG: No results found for this basename: GLUCAP,  in the last 168 hours     Signed:  Marinda Elk  [facility] 05/04/2014, 12:29 PM

## 2014-05-04 NOTE — Progress Notes (Signed)
Report called to [facility], [facility]. Awaiting [facility] to transfer patient out.

## 2014-05-05 LAB — TYPE AND SCREEN
ABO/RH(D): O POS
Antibody Screen: NEGATIVE
Unit division: 0
Unit division: 0

## 2014-05-05 NOTE — Discharge Summary (Addendum)
Physician Discharge Summary  Joseph Ponce [id number]:[id number] DOB: [birth year] DOA: 05/01/2014  PCP: Joseph Sleeper, MD  Admit date: 05/01/2014 Discharge date: 05/05/2014  Time spent: 35 minutes  Recommendations for Outpatient Follow-up:  1. Follow up with PCP in 2 weeks.  Discharge Diagnoses:  Principal Problem:   HCAP (healthcare-associated pneumonia) Active Problems:   Acute encephalopathy   Dementia without behavioral disturbance   Anemia   S/P BKA (below knee amputation) unilateral   Hypertension   Dysphagia   Hyperlipemia   Chronic pain syndrome   Sepsis   Discharge Condition: stable  Diet recommendation: Dys 2  Filed Weights   05/01/14 2109 05/02/14 0400  Weight: 72.7 kg (160 lb 4.4 oz) 72.7 kg (160 lb 4.4 oz)    History of present illness:  80 y.o. male with Past medical history of dementia, CVA, failure to thrive, anemia, peripheral vascular disease, hypertension, left BKA, chronic hypernatremia.  Patient presented from a nursing home. He was brought in because of altered mental status. At his baseline the patient is able to carry on a conversation. Since last one week he has been having progressive slowing and less complicated. He has been having generalized weakness. Today there was decreased consciousness as well. A chest x-ray was done which showed left-sided infiltrate and the patient was brought here as he was also having fevers too.  At the time of my evaluation the patient has improved significantly from his prior arrival. He has been able to carry on a conversation denies any active pain in his chest or abdomen. Complains of some pain in his back.. Is not able to provide any prior history has been ongoing since last few days. Denies any nausea or vomiting.  He feels constipated and does not have any focal neurological deficit as per him. He has chronic contracture on the right side.   Hospital Course:  HCAP (healthcare-associated pneumonia); - started on  empiric antibiotics. - Has defervesce on Vanc and cefepime started on 6.24.2015.  - VS stable. UC negative, de-escalate to Levaquin for 5 additional days.  Chronic pain syndrome:  - Held narcotics as he was confused. - resume as an outpatient.   Normocytic Anemia:  - Repeated Hbg 11.8, after 1 unit of PRBC. He has always been 11-10. That hbg of 6 was an erroneous value.  - No prior history of anticoagulation. Nursing home denies any active bleeding or any black or bowel movements.  - Hemoccult is negative.  - Cont aspirin and Plavix .   Acute encephalopathy/ Dementia without behavioral disturbance:  - unclear of baseline.  - ? Due to infectious etiology and narcotics.  Chronic hypernatremia/Chronic kidney disease  - Start NS IV fluid changed to D5W.  - resolved..   Procedures:  CT head  Consultations:  none  Discharge Exam: Filed Vitals:   05/04/14 1510  BP: 147/70  Pulse: 71  Temp:   Resp:     General: A&O x3 Cardiovascular: RRR Respiratory: good air movement CTA B/L  Discharge Instructions You were cared for by a hospitalist during your hospital stay. If you have any questions about your discharge medications or the care you received while you were in the hospital after you are discharged, you can call the unit and asked to speak with the hospitalist on call if the hospitalist that took care of you is not available. Once you are discharged, your primary care physician will handle any further medical issues. Please note that NO REFILLS for any discharge medications will be  authorized once you are discharged, as it is imperative that you return to your primary care physician (or establish a relationship with a primary care physician if you do not have one) for your aftercare needs so that they can reassess your need for medications and monitor your lab values.      Discharge Instructions   Diet - low sodium heart healthy    Complete by:  As directed      Diet - low  sodium heart healthy    Complete by:  As directed      Increase activity slowly    Complete by:  As directed      Increase activity slowly    Complete by:  As directed             Medication List         amLODipine 10 MG tablet  Commonly known as:  NORVASC  Take 10 mg by mouth daily.     aspirin 81 MG chewable tablet  Chew 81 mg by mouth daily.     brimonidine 0.1 % Soln  Commonly known as:  ALPHAGAN P  Place 1 drop into both eyes 2 (two) times daily.     calcium-vitamin D 500-200 MG-UNIT per tablet  Commonly known as:  OSCAL WITH D  Take 1 tablet by mouth 2 (two) times daily.     cephALEXin 500 MG capsule  Commonly known as:  KEFLEX  Take 1 capsule (500 mg total) by mouth 4 (four) times daily.     CERTA-VITE PO  Take 1 tablet by mouth daily.     clopidogrel 75 MG tablet  Commonly known as:  PLAVIX  Take 75 mg by mouth daily.     denosumab 60 MG/ML Soln injection  Commonly known as:  PROLIA  Inject 60 mg into the skin every 6 (six) months.     desmopressin 0.01 % nasal solution  Commonly known as:  DDAVP  Place 10 mcg into the nose 3 (three) times daily.     dorzolamide 2 % ophthalmic solution  Commonly known as:  TRUSOPT  Place 1 drop into both eyes 2 (two) times daily.     feeding supplement (PRO-STAT SUGAR FREE 64) Liqd  Take 30 mLs by mouth 2 (two) times daily.     feeding supplement (RESOURCE BREEZE) Liqd  Take 1 Container by mouth 2 (two) times daily between meals.     gabapentin 100 MG capsule  Commonly known as:  NEURONTIN  Take 100 mg by mouth 2 (two) times daily.     HYDROcodone-acetaminophen 5-325 MG per tablet  Commonly known as:  NORCO/VICODIN  Take one tablet by mouth twice daily for pain; Take one tablet by mouth every 6 hours as needed for pain     hydroxypropyl methylcellulose 2.5 % ophthalmic solution  Commonly known as:  ISOPTO TEARS  Place 1 drop into both eyes 3 (three) times daily.     levofloxacin 750 MG tablet  Commonly  known as:  LEVAQUIN  Take 1 tablet (750 mg total) by mouth daily.     magnesium hydroxide 400 MG/5ML suspension  Commonly known as:  MILK OF MAGNESIA  Take 30 mLs by mouth daily as needed. For constipation     NAMENDA XR 28 MG Cp24  Generic drug:  Memantine HCl ER  Take 28 mg by mouth daily.     nitroGLYCERIN 0.2 mg/hr patch  Commonly known as:  NITRODUR - Dosed in mg/24 hr  Place 1 patch onto the skin daily.     polyethylene glycol packet  Commonly known as:  MIRALAX / GLYCOLAX  Take 17 g by mouth daily.     Travoprost (BAK Free) 0.004 % Soln ophthalmic solution  Commonly known as:  TRAVATAN  Place 1 drop into both eyes at bedtime.       No Known Allergies Follow-up Information   Follow up with Joseph Sleeper, MD In 2 weeks. (hospital follow up)    Specialty:  Internal Medicine   Contact information:   [address] [place] [address] [phone]        The results of significant diagnostics from this hospitalization (including imaging, microbiology, ancillary and laboratory) are listed below for reference.    Significant Diagnostic Studies: Ct Head Wo Contrast  05/01/2014   CLINICAL DATA:  Altered mental status with decreased level consciousness.  EXAM: CT HEAD WITHOUT CONTRAST  TECHNIQUE: Contiguous axial images were obtained from the base of the skull through the vertex without intravenous contrast.  COMPARISON:  CT 01/30/2014  FINDINGS: No acute intracranial hemorrhage. No focal mass lesion. No CT evidence of acute infarction. No midline shift or mass effect. No hydrocephalus. Basilar cisterns are patent.  There is severe cortical atrophy with a frontal lobe predominance not changed from prior. There is ventricular dilatation proportional to the cortical atrophy. No periventricular subcortical white matter hypodensities.  Paranasal sinuses and  mastoid air cells are clear.  IMPRESSION: 1. No acute intracranial findings.  No change from prior. 2. Extensive  cortical atrophy. 3. Extensive periventricular and subcortical white matter microvascular disease.   Electronically Signed   By: Genevive Bi M.D.   On: 05/01/2014 19:19   Dg Chest Port 1 View  05/01/2014   CLINICAL DATA:  Pneumonia.  EXAM: PORTABLE CHEST - 1 VIEW  COMPARISON:  01/31/2014  FINDINGS: There is a new patchy infiltrate in the left lower lobe. Heart size is normal. There is tortuosity of thoracic aorta. There is chronic accentuation of the interstitial markings at the right lung base. Pulmonary vascularity is normal. No effusions. No acute osseous abnormality. Multiple old healed left posterior lateral rib fractures.  IMPRESSION: Acute infiltrate in the left lower lobe.   Electronically Signed   By: Geanie Cooley M.D.   On: 05/01/2014 18:20    Microbiology: Recent Results (from the past 240 hour(s))  CULTURE, BLOOD (ROUTINE X 2)     Status: None   Collection Time    05/01/14  5:15 PM      Result Value Ref Range Status   Specimen Description BLOOD RIGHT FOREARM   Final   Special Requests BOTTLES DRAWN AEROBIC AND ANAEROBIC 5CC   Final   Culture  Setup Time     Final   Value: 05/02/2014 01:04     Performed at [facility]   Culture     Final   Value:        BLOOD CULTURE RECEIVED NO GROWTH TO DATE CULTURE WILL BE HELD FOR 5 DAYS BEFORE ISSUING A FINAL NEGATIVE REPORT     Performed at [facility]   Report Status PENDING   Incomplete  CULTURE, BLOOD (ROUTINE X 2)     Status: None   Collection Time    05/01/14  5:35 PM      Result Value Ref Range Status   Specimen Description BLOOD RIGHT HAND   Final   Special Requests BOTTLES DRAWN AEROBIC AND ANAEROBIC 5CC   Final  Culture  Setup Time     Final   Value: 05/02/2014 01:03     Performed at [facility]   Culture     Final   Value:        BLOOD CULTURE RECEIVED NO GROWTH TO DATE CULTURE WILL BE HELD FOR 5 DAYS BEFORE ISSUING A FINAL NEGATIVE REPORT     Performed at [facility]   Report  Status PENDING   Incomplete  URINE CULTURE     Status: None   Collection Time    05/01/14  8:06 PM      Result Value Ref Range Status   Specimen Description URINE, CATHETERIZED   Final   Special Requests NONE   Final   Culture  Setup Time     Final   Value: 05/02/2014 01:10     Performed at [facility] Count     Final   Value: 70,000 COLONIES/ML     Performed at [facility]   Culture     Final   Value: Multiple bacterial morphotypes present, none predominant. Suggest appropriate recollection if clinically indicated.     Performed at [facility]   Report Status 05/02/2014 FINAL   Final  MRSA PCR SCREENING     Status: None   Collection Time    05/01/14 10:18 PM      Result Value Ref Range Status   MRSA by PCR NEGATIVE  NEGATIVE Final   Comment:            The GeneXpert MRSA Assay (FDA     approved for NASAL specimens     only), is one component of a     comprehensive MRSA colonization     surveillance program. It is not     intended to diagnose MRSA     infection nor to guide or     monitor treatment for     MRSA infections.     Labs: Basic Metabolic Panel:  Recent Labs Lab 05/01/14 1713 05/02/14 0530 05/03/14 0705 05/04/14 0443  NA 153* 153* 150* 145  K 4.6 4.5 3.7 3.7  CL 116* 117* 112 107  CO2 25 19 25 25   GLUCOSE 120* 115* 137* 136*  BUN 55* 49* 44* 35*  CREATININE 2.86* 2.40* 2.03* 1.77*  CALCIUM 9.5 9.8 9.4 8.7   Liver Function Tests:  Recent Labs Lab 05/01/14 1713 05/02/14 0530  AST 50* 34  ALT 39 35  ALKPHOS 80 90  BILITOT <0.2* 0.8  PROT 6.3 7.3  ALBUMIN 2.3* 2.4*   No results found for this basename: LIPASE, AMYLASE,  in the last 168 hours No results found for this basename: AMMONIA,  in the last 168 hours CBC:  Recent Labs Lab 05/01/14 1713 05/02/14 0530  WBC 15.0* 15.2*  NEUTROABS 11.2* 11.6*  HGB 6.4* 11.8*  HCT 20.3* 37.2*  MCV 86.0 85.9  PLT 247 212   Cardiac Enzymes: No results found for  this basename: CKTOTAL, CKMB, CKMBINDEX, TROPONINI,  in the last 168 hours BNP: BNP (last 3 results) No results found for this basename: PROBNP,  in the last 8760 hours CBG: No results found for this basename: GLUCAP,  in the last 168 hours     Signed:  Marinda Elk  [facility] 05/05/2014, 10:08 AM

## 2014-05-07 ENCOUNTER — Non-Acute Institutional Stay (SKILLED_NURSING_FACILITY): Payer: Medicare Other | Admitting: Internal Medicine

## 2014-05-07 DIAGNOSIS — N189 Chronic kidney disease, unspecified: Secondary | ICD-10-CM

## 2014-05-07 DIAGNOSIS — J189 Pneumonia, unspecified organism: Secondary | ICD-10-CM

## 2014-05-07 DIAGNOSIS — E43 Unspecified severe protein-calorie malnutrition: Secondary | ICD-10-CM

## 2014-05-07 DIAGNOSIS — N179 Acute kidney failure, unspecified: Secondary | ICD-10-CM

## 2014-05-08 LAB — CULTURE, BLOOD (ROUTINE X 2)
CULTURE: NO GROWTH
Culture: NO GROWTH

## 2014-05-14 NOTE — Progress Notes (Addendum)
Patient ID: Joseph ReedyJohn Ponce, male   DOB: 05-Jul-1924, 80 y.o.   MRN: [id number]                  HISTORY & PHYSICAL  DATE:  05/07/2014    [facility]    LEVEL OF CARE:   SNF   CHIEF COMPLAINT:  Readmission to the facility, post stay at [facility], 05/01/2014 through 05/05/2014.    HISTORY OF PRESENT ILLNESS:  This is a longstanding resident of this facility who has been in the building since 2005.  He has a history of probably vascular dementia, failure to thrive.  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    The patient presented because of altered mental status.   He was not able to carry on a conversation and had become less and less aware.   Apparently this had been going on for a week, although I do not think anybody in our service knew about this.  In any case, a chest x-ray showed a left-sided infiltrate and it was noted that the patient was febrile.  He was treated for healthcare-acquired pneumonia, on vancomycin and cefepime.  He was de-escalated to Levaquin for an additional five days.    It was also noted that he was hypernatremic with a sodium up to 153.  He received IV fluid and the problem resolved, although this has been a recurrent problem.    PAST MEDICAL HISTORY/PROBLEM LIST:    Status post BKA.    Acute-on-chronic renal failure.    Renal artery stenosis.    Peripheral vascular disease.    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    Hyperlipidemia.    Dementia.    COPD.    Chronic pain syndrome.     Sinus bradycardia.    Anemia.    CURRENT MEDICATIONS:  Discharge medications include:      Amlodipine 10 q.d.    ASA 81 q.d.    Alphagan ophthalmic 1 drop into both eyes b.i.d.    Os-Cal with D 500/200 b.i.d.    Keflex 500 four times a day.    Plavix 75 q.d.    Prolia 60 mg into the skin every six months.    Trusopt 1 drop into both eyes two times daily.    DDAVP 10 mcg into  the nose three times daily.    Neurontin 100 b.i.d.    Namenda XR 28 mg daily.    Nitroglycerin 0.2 mg/hr patch, 1 patch into the skin daily.    Travatan 1 drop into both eyes at bedtime.    SOCIAL HISTORY: HOUSING:  The patient is a longstanding resident of this facility.   CODE STATUS:   There are no advanced directives on the chart.    REVIEW OF SYSTEMS:   MUSCULOSKELETAL:  The patient is complaining of right leg pain.   CHEST/RESPIRATORY:  No cough.  No sputum.   CARDIAC:   No chest pain.   GI:  No nausea, vomiting or diarrhea.    PHYSICAL EXAMINATION:   VITAL SIGNS:   PULSE:  63.   RESPIRATIONS:  16 and unlabored.   O2 SATURATIONS:  95% on room air.   GENERAL APPEARANCE:  The patient is awake, conversational.   HEENT:   MOUTH/THROAT:   He is edentulous.  Mucous membranes seem moist.   CHEST/RESPIRATORY:  Shallow, but otherwise clear air entry bilaterally.   CARDIOVASCULAR:  CARDIAC:   Heart sounds  are normal.  There are no murmurs.   GASTROINTESTINAL:  LIVER/SPLEEN/KIDNEYS:  No liver, no spleen.  No tenderness.   GENITOURINARY:  BLADDER:   No suprapubic or costovertebral angle tenderness.   MUSCULOSKELETAL:   EXTREMITIES:   LEFT LOWER EXTREMITY:  He has had a left BKA.   RIGHT LOWER EXTREMITY:  He has, no doubt, significant PAD in the right leg.  Whether this is accounting for some of his pain or not, I am uncertain.    ASSESSMENT/PLAN:  Presenting with altered LOC.  Found to have pneumonia, successfully treated.    Acute renal failure.  Responding to IV fluid.  His initial lab showed a sodium of 153, a BUN of 56, a creatinine of 2.86.  His baseline lab work would appear to be roughly 31 and 1.68, respectively.  Last checked on 03/25/2014 at 42 and 1.88, respectively.  At discharge this time, his sodium was 145, BUN of 35, creatinine of 1.77.    Severe protein calorie malnutrition.  His albumin was 2.3 in the hospital, repeated at 2.4.    This would reflect severe  protein calorie malnutrition.    Bradycardia.   This is asymptomatic.   His heart rate occasionally gets down into the 40s.  Most of the time, it is in the 60s.  His blood pressure has always remained stable.  I have not sent him to Cardiology for this.    I will check his lab work next week, including his basic metabolic panel and a CBC.  I wonder if we are in a failure to thrive syndrome.

## 2014-05-15 ENCOUNTER — Non-Acute Institutional Stay (SKILLED_NURSING_FACILITY): Payer: Medicare Other | Admitting: Internal Medicine

## 2014-05-15 DIAGNOSIS — N183 Chronic kidney disease, stage 3 unspecified: Secondary | ICD-10-CM

## 2014-05-15 DIAGNOSIS — R634 Abnormal weight loss: Secondary | ICD-10-CM

## 2014-05-16 ENCOUNTER — Other Ambulatory Visit (HOSPITAL_BASED_OUTPATIENT_CLINIC_OR_DEPARTMENT_OTHER): Payer: Self-pay | Admitting: Internal Medicine

## 2014-05-16 DIAGNOSIS — R634 Abnormal weight loss: Secondary | ICD-10-CM

## 2014-05-20 ENCOUNTER — Ambulatory Visit (HOSPITAL_COMMUNITY)
Admission: RE | Admit: 2014-05-20 | Discharge: 2014-05-20 | Disposition: A | Discharge status: Home / Self Care | Payer: Medicare Other | Source: Ambulatory Visit | Attending: Internal Medicine | Admitting: Internal Medicine

## 2014-05-20 ENCOUNTER — Other Ambulatory Visit (HOSPITAL_BASED_OUTPATIENT_CLINIC_OR_DEPARTMENT_OTHER): Payer: Self-pay | Admitting: Internal Medicine

## 2014-05-20 DIAGNOSIS — F0390 Unspecified dementia without behavioral disturbance: Secondary | ICD-10-CM | POA: Insufficient documentation

## 2014-05-20 DIAGNOSIS — R634 Abnormal weight loss: Secondary | ICD-10-CM | POA: Insufficient documentation

## 2014-05-20 DIAGNOSIS — R5381 Other malaise: Secondary | ICD-10-CM | POA: Insufficient documentation

## 2014-05-20 DIAGNOSIS — R109 Unspecified abdominal pain: Secondary | ICD-10-CM | POA: Insufficient documentation

## 2014-05-20 DIAGNOSIS — R5383 Other fatigue: Secondary | ICD-10-CM

## 2014-05-20 DIAGNOSIS — K224 Dyskinesia of esophagus: Secondary | ICD-10-CM | POA: Insufficient documentation

## 2014-05-21 NOTE — Progress Notes (Addendum)
Patient ID: Joseph Ponce, male   DOB: Dec 11, 1923, 80 y.o.   MRN: [id number]                PROGRESS NOTE  DATE:  05/15/2014     [name]    LEVEL OF CARE:   SNF   Acute Visit   CHIEF COMPLAINT:  Weight loss, ?early satiety.    HISTORY OF PRESENT ILLNESS:  Mr. Petteway is a longstanding resident of this facility.  He has been in the building since 2005.    His predominant morbidity is that of vascular dementia.  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  Has a diagnosis of chronic hypernatremia, on DDAVP.    He was recently in the hospital with altered mental status, felt to be secondary to pneumonia plus hypernatremia and acute renal failure.  His creatinine got as high as 2.86.   His baseline creatinine appears to be in the 1.8 range.  He was rehydrated with hypotonic fluid, given antibiotics, and he was returned to the facility.    He appears to be stable from a medical point of view.    Lab work from two days ago showed a sodium of 143, a BUN of 34, and a creatinine of 1.9.  His albumin was 3.  Hemoglobin 10.    It was brought to my attention that he is eating poorly.  One of the therapists actually stated that he talks about being full after a very few bites of food.  In fact, he states, "I'm full up to my neck".  His albumin was 2.3 in the hospital, yet it was actually 3 on lab work from two days ago.  However, even an albumin of 3 is indicative of moderate protein calorie malnutrition.    REVIEW OF SYSTEMS:   CHEST/RESPIRATORY:  He is not complaining of shortness of breath.   CARDIAC:   No complaints of chest pain or exertional chest symptoms.    GI:  He states to me that he just is not very hungry.  I am not really picking up on the early satiety problem.  He does not clearly state that he has dysphagia or odynophagia.  Nevertheless, I have reviewed his weights in the facility.  It would appear that he has lost approximately 8 pounds since February.  His most recent  weight today is 163 pounds.  He was as high as 177 pounds in September 2014.    PHYSICAL EXAMINATION:   GENERAL APPEARANCE:  The patient is awake and conversational.   CHEST/RESPIRATORY:  Clear air entry bilaterally.   CARDIOVASCULAR:  CARDIAC:   Heart sounds are normal.  He appears to be euvolemic.   GASTROINTESTINAL:  ABDOMEN:   Nontender.  Bowel sounds are positive.  There is no succussion splash.   LIVER/SPLEEN/KIDNEYS:  No liver, no spleen.    ASSESSMENT/PLAN:  Weight loss.   Indeed, there appears to have been a progressive weight loss over the last year of 22 pounds, 8 pounds since February of this year.  He  apparently eats very poorly.  There is some suggestion he has early satiety.  My first thought here would be a gastric esophagram.   I am weighing this against sending him to GI for an endoscopy.  I am going to try to order the former, although somebody may need to go with him to help him get through this test.    Chronic renal failure.   This was acute in the  hospital.  His lab work today of 34/1.9 appears to be at his baseline.

## 2014-06-14 ENCOUNTER — Non-Acute Institutional Stay (SKILLED_NURSING_FACILITY): Payer: Medicare Other | Admitting: Internal Medicine

## 2014-06-14 ENCOUNTER — Encounter: Payer: Self-pay | Admitting: Internal Medicine

## 2014-06-14 DIAGNOSIS — Z89512 Acquired absence of left leg below knee: Secondary | ICD-10-CM

## 2014-06-14 DIAGNOSIS — I10 Essential (primary) hypertension: Secondary | ICD-10-CM

## 2014-06-14 DIAGNOSIS — F0390 Unspecified dementia without behavioral disturbance: Secondary | ICD-10-CM

## 2014-06-14 DIAGNOSIS — I739 Peripheral vascular disease, unspecified: Secondary | ICD-10-CM

## 2014-06-14 DIAGNOSIS — M79651 Pain in right thigh: Secondary | ICD-10-CM

## 2014-06-14 DIAGNOSIS — R1310 Dysphagia, unspecified: Secondary | ICD-10-CM

## 2014-06-14 DIAGNOSIS — M79609 Pain in unspecified limb: Secondary | ICD-10-CM

## 2014-06-14 DIAGNOSIS — S88119A Complete traumatic amputation at level between knee and ankle, unspecified lower leg, initial encounter: Secondary | ICD-10-CM

## 2014-06-14 DIAGNOSIS — I498 Other specified cardiac arrhythmias: Secondary | ICD-10-CM

## 2014-06-14 DIAGNOSIS — R001 Bradycardia, unspecified: Secondary | ICD-10-CM

## 2014-06-14 NOTE — Progress Notes (Signed)
Patient ID: Joseph Ponce, male   DOB: [birth year], 80 y.o.   MRN: [id number]   this is a routine-acute visit.  Level of care skilled.  [place].   Chief Complaint   Patient presents with   .  Medical Management of Chronic Issues--complaints of right thigh pain   HPI:  80 year old male who is a LTR of [facility] with a pmh of dementia, OP, FTT, CVA, anemia, hyperlipidemia, PVD and htn. Pt is being seen today for routine follow up on chronic conditions Appears to be at his baseline-Dr. Dellia Nims did see him about a month ago for complaints of feeling early satiety while eating-upper GI study was done which showed no gastric outlet obstruction did show some intermittent esophageal dysmotility but no esophageal narrowing--apparently it was a limited study.  He had lost some weight although this appears to have stabilized in the past month he does not really complain of this today.  He was hospitalized earlier this summer and diagnosed with pneumonia which was treated with antibiotics he also had acute on chronic renal failure which was treated with fluids-baseline creatinine is 1.8 we will update this  He has complained of some right thigh pain apparently this at times affects his ability to work with therapy-he does have a history of leg pain and is on Norco 03/10/2024 one tablet twice a day as well as Neurontin.  He does have a history of a left below-the-knee amputation  Is has been noted to have bradycardia at times however he has been asymptomatic and this has been monitored--pulse today is in the 50s Review of Systems:  Review of Systems  Constitutional: Negative for fever, chills and malaise/fatigue.  Eyes: Negative.  Respiratory: Negative for cough and shortness of breath.  Cardiovascular: Negative for chest pain and leg swelling.  Gastrointestinal: Negative for heartburn, abdominal pain, diarrhea and constipation--did have issues with early satiety--but does not really  complain of that today.  Genitourinary: Negative for dysuria, urgency and frequency.  Musculoskeletal: Positive for myalgias (in right leg).   Skin: Negative.  Neurological: Negative for dizziness, tremors, weakness and headaches.  Psychiatric/Behavioral: Positive for memory loss. Negative for depression. The patient is not nervous/anxious and does not have insomnia.  Past Medical History   Diagnosis  Date   .  Dementia    .  Muscle weakness    .  Osteoporosis    .  Alzheimer disease    .  Failure to thrive in adult    .  Dementia    .  CVA (cerebral vascular accident)      speech and language d/o deficits   .  Weakness    .  Hyperlipemia    .  Dysphagia    .  Anemia    .  PVD (peripheral vascular disease)    .  COPD (chronic obstructive pulmonary disease)    .  Glaucoma (increased eye pressure)    .  Hypertension    .  S/P BKA (below knee amputation) unilateral  01/21/2006     left   .  Renal artery stenosis     Past Surgical History   Procedure  Laterality  Date   .  Leg amputation below knee       left   Social History:  reports that he has quit smoking. He does not have any smokeless tobacco history on file. He reports that he does not drink alcohol or use illicit drugs.  No family history on  file.  Medications: Have been reviewed per [facility]                                                                 Physical Exam T-98.1 pulse 62 respirations 18 blood pressure variable systolics 903 to [id number] recently 120/70-174/66 in this range pulses have been in the 50-80s range-weight is 160 this has been stable the past month after what appears to be some weight loss previously  Constitutional: He is well-developed, well-nourished, and in no distress.  HENT:  Mouth/Throat: Oropharynx is clear and moist. No oropharyngeal exudate.  Eyes: Conjunctivae and EOM are normal. Pupils are equal, round, and reactive to light.  Neck: Normal range of motion. Neck supple.   Cardiovascular: Normal rate, regular rhythm and normal heart sounds.  Pulmonary/Chest: Effort normal and breath sounds normal.  Abdominal: Soft. Bowel sounds are normal. He exhibits no distension.  Musculoskeletal: He exhibits no edema and no tenderness.  Left BKA; self propels in WC,  Right thigh area has some minimal tenderness to palpation it is soft although he feels there is an area of firmness here superior to the knee there is no erythema-he does have  A. Reduced difficult to palpate pedal pulse -he has a well-healed knee scar.   Neurological: He is alert.  Skin: Skin is warm and dry. He is not diaphoretic.  Psychiatric: Affect normal.  Labs reviewed:  04/26/2014.  Sodium 144 potassium 4.8 BUN 45 creatinine 1.82.  TSH-0.877.  03/06/2014.  WBC 5.0 hemoglobin 10.7 platelets 184.    Basic Metabolic Panel:  Recent Labs   01/30/14 2324  01/30/14 2329   NA  147  151*   K  5.7*  5.7*   CL  114*  113*   CO2  26  --   GLUCOSE  87  88   BUN  46*  45*   CREATININE  1.80*  1.90*   CALCIUM  9.4  --   Liver Function Tests:  Recent Labs   01/30/14 2324   AST  17   ALT  13   ALKPHOS  80   BILITOT  <0.2*   PROT  6.5   ALBUMIN  3.0*   No results found for this basename: LIPASE, AMYLASE, in the last 8760 hours  No results found for this basename: AMMONIA, in the last 8760 hours  CBC:  Recent Labs   01/30/14 2324  01/30/14 2329   WBC  6.3  --   NEUTROABS  3.4  --   HGB  10.6*  11.6*   HCT  32.9*  34.0*   MCV  87.0  --   PLT  198  --   CBC with Diff  Result: 02/16/2014 5:47 PM ( Status: F )  WBC 5.2 4.0-10.5 K/uL SLN  RBC 3.94 L 4.22-5.81 MIL/uL SLN  Hemoglobin 10.6 L 13.0-17.0 g/dL SLN  Hematocrit 33.1 L 39.0-52.0 % SLN  MCV 84.0 78.0-100.0 fL SLN  MCH 26.9 26.0-34.0 pg SLN  MCHC 32.0 30.0-36.0 g/dL SLN  RDW 16.0 H 11.5-15.5 % SLN  Platelet Count 192 150-400 K/uL SLN  Granulocyte % 69 43-77 % SLN  Absolute Gran 3.6 1.7-7.7 K/uL SLN  Lymph % 20 12-46 % SLN    Absolute Lymph 1.0 0.7-4.0 K/uL SLN  Mono % 7 3-12 % SLN  Absolute Mono 0.4 0.1-1.0 K/uL SLN  Eos % 3 0-5 % SLN  Absolute Eos 0.2 0.0-0.7 K/uL SLN  Baso % 1 0-1 % SLN  Absolute Baso 0.1 0.0-0.1 K/uL SLN  Smear Review Criteria for review not met SLN  Basic Metabolic Panel  Result: 3/79/4446 7:15 PM ( Status: F )  Sodium 150 H 135-145 mEq/L SLN  Potassium 4.1 3.5-5.3 mEq/L SLN  Chloride 114 H 96-112 mEq/L SLN  CO2 28 19-32 mEq/L SLN  Glucose 147 H 70-99 mg/dL SLN  BUN 41 H 6-23 mg/dL SLN  Creatinine 1.55 H 0.50-1.35 mg/dL SLN  Calcium 9.6  Assessment/Plan  1. Bradycardia, sinus  -has been seen by Dr Dellia Nims and been sent to the ED multiple times in May regarding this, pt remains symptomatic,--per Dr. Janalyn Rouse assessment we'll continue to monitor   2. Coronary artery disease involving native coronary artery of native heart without angina pectoris  -has been stable, no reports of chest pain  -conts on plavix and aspirin 3. Dementia without behavioral disturbance  -stable--on Namenda  4. Chronic pain syndrome  With complaints of pain--aware apparently there has been lethargy in the past as she is on Norco twice a day at this point does not appear to be in any acute discomfort she is also on Neurontin.   5. Hypertension  Stable with some variability as noted above continue to monitor he is receiving Norvasc  #6 chronic renal insufficiency-to be at his baseline Will need to update his lab however.  #7 anemia suspect chronic disease we'll update CBC.  #8-history of increased right thigh discomfort-again he is receiving Norco for pain which appears to help-Will x-ray the area-also will get venous and arterial Dopplers secondary to concerns for peripheral vascular disease and rule out a clot since he feels there is some firmness at times  #9  glaucoma he continues on topical eyedrops.  #10 osteoporosis he is on Os-Cal with vitamin D.   #11 weight loss-this appears to have  stabilized -he did not really complain of dysphagia today -this will have to be monitored certainly      [id number].

## 2014-07-29 ENCOUNTER — Non-Acute Institutional Stay (SKILLED_NURSING_FACILITY): Payer: Medicare Other | Admitting: Nurse Practitioner

## 2014-07-29 DIAGNOSIS — I498 Other specified cardiac arrhythmias: Secondary | ICD-10-CM

## 2014-07-29 DIAGNOSIS — D649 Anemia, unspecified: Secondary | ICD-10-CM

## 2014-07-29 DIAGNOSIS — R001 Bradycardia, unspecified: Secondary | ICD-10-CM

## 2014-07-29 DIAGNOSIS — I2510 Atherosclerotic heart disease of native coronary artery without angina pectoris: Secondary | ICD-10-CM

## 2014-07-29 DIAGNOSIS — G894 Chronic pain syndrome: Secondary | ICD-10-CM

## 2014-07-29 DIAGNOSIS — K5900 Constipation, unspecified: Secondary | ICD-10-CM

## 2014-07-29 NOTE — Progress Notes (Signed)
Patient ID: Torsten Weniger, male   DOB: 1924/05/14, 80 y.o.   MRN: [id number]    Nursing Home Location:  [place] of Service: SNF ([id number])SNF  PCP: Cyndee Brightly, MD  No Known Allergies  Chief Complaint  Patient presents with  . Medical Management of Chronic Issues    HPI:  80 year old male who is a LTR of [facility] with a pmh of dementia, OP, FTT, CVA, anemia, hyperlipidemia, PVD and htn. Pt is being seen today for routine follow up on chronic conditions. Pt has done well in the last month without any acute issues. Staff without concerns. Pt is without complaints at this time.   Review of Systems:  Review of Systems  Constitutional: Negative for fever, chills and malaise/fatigue.  Eyes: Negative.   Respiratory: Negative for cough and shortness of breath.   Cardiovascular: Negative for chest pain and leg swelling.  Gastrointestinal: Negative for heartburn, abdominal pain, diarrhea and constipation.  Genitourinary: Negative for dysuria, urgency and frequency.  Musculoskeletal: Positive for myalgias (in right leg). Negative for joint pain.  Skin: Negative.   Neurological: Negative for dizziness, tremors, weakness and headaches.  Psychiatric/Behavioral: Positive for memory loss. Negative for depression. The patient is not nervous/anxious and does not have insomnia.      Past Medical History  Diagnosis Date  . Dementia   . Muscle weakness   . Osteoporosis   . Alzheimer disease   . Failure to thrive in adult   . Dementia   . CVA (cerebral vascular accident)     speech and language d/o deficits  . Weakness   . Hyperlipemia   . Dysphagia   . Anemia   . PVD (peripheral vascular disease)   . COPD (chronic obstructive pulmonary disease)   . Glaucoma (increased eye pressure)   . Hypertension   . S/P BKA (below knee amputation) unilateral 01/21/2006    left  . Renal artery stenosis    Past Surgical History  Procedure Laterality Date  . Leg  amputation below knee      left   Social History:   reports that he has quit smoking. He does not have any smokeless tobacco history on file. He reports that he does not drink alcohol or use illicit drugs.  No family history on file.  Medications: Patient's Medications  New Prescriptions   No medications on file  Previous Medications   AMINO ACIDS-PROTEIN HYDROLYS (FEEDING SUPPLEMENT, PRO-STAT SUGAR FREE 64,) LIQD    Take 30 mLs by mouth 2 (two) times daily.   AMLODIPINE (NORVASC) 10 MG TABLET    Take 10 mg by mouth daily.   ASPIRIN 81 MG CHEWABLE TABLET    Chew 81 mg by mouth daily.   BRIMONIDINE (ALPHAGAN P) 0.1 % SOLN    Place 1 drop into both eyes 2 (two) times daily.   CALCIUM-VITAMIN D (OSCAL WITH D) 500-200 MG-UNIT PER TABLET    Take 1 tablet by mouth 2 (two) times daily.   CLOPIDOGREL (PLAVIX) 75 MG TABLET    Take 75 mg by mouth daily.   DENOSUMAB (PROLIA) 60 MG/ML SOLN INJECTION    Inject 60 mg into the skin every 6 (six) months.    DESMOPRESSIN (DDAVP) 0.01 % NASAL SOLUTION    Place 10 mcg into the nose 3 (three) times daily.   DORZOLAMIDE (TRUSOPT) 2 % OPHTHALMIC SOLUTION    Place 1 drop into both eyes 2 (two) times daily.   FEEDING SUPPLEMENT, RESOURCE  BREEZE, (RESOURCE BREEZE) LIQD    Take 1 Container by mouth 2 (two) times daily between meals.   GABAPENTIN (NEURONTIN) 100 MG CAPSULE    Take 100 mg by mouth 2 (two) times daily.   HYDROCODONE-ACETAMINOPHEN (NORCO/VICODIN) 5-325 MG PER TABLET    Take one tablet by mouth twice daily for pain; Take one tablet by mouth every 6 hours as needed for pain   HYDROXYPROPYL METHYLCELLULOSE (ISOPTO TEARS) 2.5 % OPHTHALMIC SOLUTION    Place 1 drop into both eyes 3 (three) times daily.   MAGNESIUM HYDROXIDE (MILK OF MAGNESIA) 400 MG/5ML SUSPENSION    Take 30 mLs by mouth daily as needed. For constipation   MEMANTINE HCL ER (NAMENDA XR) 28 MG CP24    Take 28 mg by mouth daily.   MULTIPLE VITAMINS-MINERALS (CERTA-VITE PO)    Take 1 tablet by  mouth daily.   NITROGLYCERIN (NITRODUR - DOSED IN MG/24 HR) 0.2 MG/HR    Place 1 patch onto the skin daily.   POLYETHYLENE GLYCOL (MIRALAX / GLYCOLAX) PACKET    Take 17 g by mouth daily.   TRAVOPROST, BAK FREE, (TRAVATAN) 0.004 % SOLN OPHTHALMIC SOLUTION    Place 1 drop into both eyes at bedtime.  Modified Medications   No medications on file  Discontinued Medications   No medications on file     Physical Exam:  Filed Vitals:   07/29/14 1549  BP: 144/62  Pulse: 63  Temp: 97.3 F (36.3 C)  Resp: 18  Weight: 164 lb (74.39 kg)    Physical Exam  Vitals reviewed. Constitutional: He is well-developed, well-nourished, and in no distress.  HENT:  Mouth/Throat: Oropharynx is clear and moist. No oropharyngeal exudate.  Eyes: Conjunctivae and EOM are normal. Pupils are equal, round, and reactive to light.  Neck: Normal range of motion. Neck supple.  Cardiovascular: Normal rate, regular rhythm and normal heart sounds.   Pulmonary/Chest: Effort normal and breath sounds normal.  Abdominal: Soft. Bowel sounds are normal. He exhibits no distension.  Musculoskeletal: He exhibits no edema and no tenderness.  Left BKA; self propels in [facility], currently in bed  Neurological: He is alert.  Skin: Skin is warm and dry. He is not diaphoretic.  Psychiatric: Affect normal.     Labs reviewed: Basic Metabolic Panel:  Recent Labs  05/02/14 0530 05/03/14 0705 05/04/14 0443  NA 153* 150* 145  K 4.5 3.7 3.7  CL 117* 112 107  CO2 _0 GLUCOSE 115* 137* 136*  BUN 49* 44* 35*  CREATININE 2.40* 2.03* 1.77*  CALCIUM 9.8 9.4 8.7   Liver Function Tests:  Recent Labs  01/30/14 2324 05/01/14 1713 05/02/14 0530  AST 17 50* 34  ALT 13 39 35  ALKPHOS 80 80 90  BILITOT <0.2* <0.2* 0.8  PROT 6.5 6.3 7.3  ALBUMIN 3.0* 2.3* 2.4*   No results found for this basename: LIPASE, AMYLASE,  in the last 8760 hours No results found for this basename: AMMONIA,  in the last 8760 hours CBC:  Recent  Labs  01/30/14 2324 01/30/14 2329 05/01/14 1713 05/02/14 0530  WBC 6.3  --  15.0* 15.2*  NEUTROABS 3.4  --  11.2* 11.6*  HGB 10.6* 11.6* 6.4* 11.8*  HCT 32.9* 34.0* 20.3* 37.2*  MCV 87.0  --  86.0 85.9  PLT 198  --  247 212   CBC with Diff  Result: 02/16/2014 5:47 PM ( Status: F )  WBC 5.2 4.0-10.5 K/uL SLN  RBC 3.94 L 4.22-5.81 MIL/uL  SLN  Hemoglobin 10.6 L 13.0-17.0 g/dL SLN  Hematocrit 33.1 L 39.0-52.0 % SLN  MCV 84.0 78.0-100.0 fL SLN  MCH 26.9 26.0-34.0 pg SLN  MCHC 32.0 30.0-36.0 g/dL SLN  RDW 16.0 H 11.5-15.5 % SLN  Platelet Count 192 150-400 K/uL SLN  Granulocyte % 69 43-77 % SLN  Absolute Gran 3.6 1.7-7.7 K/uL SLN  Lymph % 20 12-46 % SLN  Absolute Lymph 1.0 0.7-4.0 K/uL SLN  Mono % 7 3-12 % SLN  Absolute Mono 0.4 0.1-1.0 K/uL SLN  Eos % 3 0-5 % SLN  Absolute Eos 0.2 0.0-0.7 K/uL SLN  Baso % 1 0-1 % SLN  Absolute Baso 0.1 0.0-0.1 K/uL SLN  Smear Review Criteria for review not met SLN  Basic Metabolic Panel  Result: 2/99/3716 7:15 PM ( Status: F )  Sodium 150 H 135-145 mEq/L SLN  Potassium 4.1 3.5-5.3 mEq/L SLN  Chloride 114 H 96-112 mEq/L SLN  CO2 28 19-32 mEq/L SLN  Glucose 147 H 70-99 mg/dL SLN  BUN 41 H 6-23 mg/dL SLN  Creatinine 1.55 H 0.50-1.35 mg/dL SLN  Calcium 9.6  CBC NO Diff (Complete Blood Count)    Result: 06/21/2014 10:16 AM   ( Status: F )     C WBC 5.1     4.0-10.5 K/uL SLN   RBC 3.74   L 4.22-5.81 MIL/uL SLN   Hemoglobin 9.7   L 13.0-17.0 g/dL SLN   Hematocrit 31.1   L 39.0-52.0 % SLN   MCV 83.2     78.0-100.0 fL SLN   MCH 25.9   L 26.0-34.0 pg SLN   MCHC 31.2     30.0-36.0 g/dL SLN   RDW 16.3   H 11.5-15.5 % SLN   Platelet Count 194     150-400 K/uL SLN   Comprehensive Metabolic Panel    Result: 06/21/2014 11:17 AM   ( Status: F )       Sodium 145     135-145 mEq/L SLN   Potassium 4.4     3.5-5.3 mEq/L SLN   Chloride 112     96-112 mEq/L SLN   CO2 31     19-32 mEq/L SLN   Glucose 82     70-99 mg/dL SLN   BUN 39   H 6-23 mg/dL SLN     Creatinine 1.73   H 0.50-1.35 mg/dL SLN   Bilirubin, Total 0.3     0.2-1.2 mg/dL SLN   Alkaline Phosphatase 54     39-117 U/L SLN   AST/SGOT 12     0-37 U/L SLN   ALT/SGPT <8     0-53 U/L SLN   Total Protein 5.5   L 6.0-8.3 g/dL SLN   Albumin 2.9   L 3.5-5.2 g/dL SLN   Calcium 9.0   Assessment/Plan  1. Coronary artery disease involving native coronary artery of native heart without angina pectoris No noted chest pain, conts plavix  2. Unspecified constipation -without current complaints  3. Chronic pain syndrome On norco twice daily with PRN, pain seems to be well controlled at this time  4. Anemia, unspecified anemia type stable  5. Bradycardia, sinus -asymptomatic at this time, HR ranging from 50-75s on review over the last month   6. Weight loss - Esophageal dysmotility found on UGI, will start low dose reglan 2.5 mg TID before meals to see if this helps early satiety and weight loss -will cont to monitor weights-- have been stable over the last few months

## 2014-08-09 ENCOUNTER — Other Ambulatory Visit: Payer: Self-pay | Admitting: *Deleted

## 2014-08-09 MED ORDER — HYDROCODONE-ACETAMINOPHEN 5-325 MG PO TABS: ORAL_TABLET | ORAL | Status: DC

## 2014-08-09 NOTE — Telephone Encounter (Signed)
[facility] 

## 2014-08-26 ENCOUNTER — Non-Acute Institutional Stay (SKILLED_NURSING_FACILITY): Payer: Medicare Other | Admitting: Nurse Practitioner

## 2014-08-26 DIAGNOSIS — K5900 Constipation, unspecified: Secondary | ICD-10-CM

## 2014-08-26 DIAGNOSIS — R001 Bradycardia, unspecified: Secondary | ICD-10-CM

## 2014-08-26 DIAGNOSIS — F0390 Unspecified dementia without behavioral disturbance: Secondary | ICD-10-CM

## 2014-08-26 DIAGNOSIS — I10 Essential (primary) hypertension: Secondary | ICD-10-CM

## 2014-08-26 DIAGNOSIS — I2510 Atherosclerotic heart disease of native coronary artery without angina pectoris: Secondary | ICD-10-CM

## 2014-08-26 DIAGNOSIS — R634 Abnormal weight loss: Secondary | ICD-10-CM

## 2014-08-26 NOTE — Progress Notes (Signed)
Patient ID: Joseph Ponce, male   DOB: 19-Aug-1924, 80 y.o.   MRN: [id number]    Nursing Home Location:  [place] of Service: SNF (31)  PCP: Cyndee Brightly, MD  No Known Allergies  Chief Complaint  Patient presents with  . Medical Management of Chronic Issues    HPI:  80 year old male who is a LTR of [facility] with a pmh of dementia, OP, FTT, CVA, anemia, hyperlipidemia, PVD and htn. Pt is being seen today for routine follow up on chronic conditions. Pharmacy recs for decrease of Reglan. Pt reports he is doing well. No nausea vomiting, diarrhea. Has constipation. Right leg pain is better after working with therapy. HR ranging from 52-74.   Review of Systems:  Review of Systems  Constitutional: Negative for fever, chills and malaise/fatigue.  Eyes: Negative.   Respiratory: Negative for cough and shortness of breath.   Cardiovascular: Negative for chest pain and leg swelling.  Gastrointestinal: Positive for constipation. Negative for heartburn, abdominal pain and diarrhea.  Genitourinary: Negative for dysuria, urgency and frequency.  Musculoskeletal: Positive for myalgias (in right leg). Negative for joint pain.  Skin: Negative.   Neurological: Negative for dizziness, tremors, weakness and headaches.  Psychiatric/Behavioral: Positive for memory loss. Negative for depression. The patient is not nervous/anxious and does not have insomnia.      Past Medical History  Diagnosis Date  . Dementia   . Muscle weakness   . Osteoporosis   . Alzheimer disease   . Failure to thrive in adult   . Dementia   . CVA (cerebral vascular accident)     speech and language d/o deficits  . Weakness   . Hyperlipemia   . Dysphagia   . Anemia   . PVD (peripheral vascular disease)   . COPD (chronic obstructive pulmonary disease)   . Glaucoma (increased eye pressure)   . Hypertension   . S/P BKA (below knee amputation) unilateral 01/21/2006    left  . Renal artery  stenosis    Past Surgical History  Procedure Laterality Date  . Leg amputation below knee      left   Social History:   reports that he has quit smoking. He does not have any smokeless tobacco history on file. He reports that he does not drink alcohol or use illicit drugs.  No family history on file.  Medications: Patient's Medications  New Prescriptions   No medications on file  Previous Medications   AMINO ACIDS-PROTEIN HYDROLYS (FEEDING SUPPLEMENT, PRO-STAT SUGAR FREE 64,) LIQD    Take 30 mLs by mouth 2 (two) times daily.   AMLODIPINE (NORVASC) 10 MG TABLET    Take 10 mg by mouth daily.   ASPIRIN 81 MG CHEWABLE TABLET    Chew 81 mg by mouth daily.   BRIMONIDINE (ALPHAGAN P) 0.1 % SOLN    Place 1 drop into both eyes 2 (two) times daily.   CALCIUM-VITAMIN D (OSCAL WITH D) 500-200 MG-UNIT PER TABLET    Take 1 tablet by mouth 2 (two) times daily.   CLOPIDOGREL (PLAVIX) 75 MG TABLET    Take 75 mg by mouth daily.   DENOSUMAB (PROLIA) 60 MG/ML SOLN INJECTION    Inject 60 mg into the skin every 6 (six) months.    DESMOPRESSIN (DDAVP) 0.01 % NASAL SOLUTION    Place 10 mcg into the nose 3 (three) times daily.   DORZOLAMIDE (TRUSOPT) 2 % OPHTHALMIC SOLUTION    Place 1 drop into  both eyes 2 (two) times daily.   FEEDING SUPPLEMENT, RESOURCE BREEZE, (RESOURCE BREEZE) LIQD    Take 1 Container by mouth 2 (two) times daily between meals.   GABAPENTIN (NEURONTIN) 100 MG CAPSULE    Take 100 mg by mouth 2 (two) times daily.   HYDROCODONE-ACETAMINOPHEN (NORCO/VICODIN) 5-325 MG PER TABLET    Take one tablet by mouth twice daily for pain; Take one tablet by mouth every 6 hours as needed for pain   HYDROXYPROPYL METHYLCELLULOSE (ISOPTO TEARS) 2.5 % OPHTHALMIC SOLUTION    Place 1 drop into both eyes 3 (three) times daily.   MAGNESIUM HYDROXIDE (MILK OF MAGNESIA) 400 MG/5ML SUSPENSION    Take 30 mLs by mouth daily as needed. For constipation   MEMANTINE HCL ER (NAMENDA XR) 28 MG CP24    Take 28 mg by mouth  daily.   MULTIPLE VITAMINS-MINERALS (CERTA-VITE PO)    Take 1 tablet by mouth daily.   NITROGLYCERIN (NITRODUR - DOSED IN MG/24 HR) 0.2 MG/HR    Place 1 patch onto the skin daily.   POLYETHYLENE GLYCOL (MIRALAX / GLYCOLAX) PACKET    Take 17 g by mouth daily.   TRAVOPROST, BAK FREE, (TRAVATAN) 0.004 % SOLN OPHTHALMIC SOLUTION    Place 1 drop into both eyes at bedtime.  Modified Medications   No medications on file  Discontinued Medications   No medications on file     Physical Exam:  Filed Vitals:   08/26/14 1533  BP: 160/64  Pulse: 56  Temp: 97.8 F (36.6 C)  Resp: 20  Weight: 165 lb (74.844 kg)    Physical Exam  Vitals reviewed. Constitutional: He is well-developed, well-nourished, and in no distress.  HENT:  Mouth/Throat: Oropharynx is clear and moist. No oropharyngeal exudate.  Eyes: Conjunctivae and EOM are normal. Pupils are equal, round, and reactive to light.  Neck: Normal range of motion. Neck supple.  Cardiovascular: Normal rate, regular rhythm and normal heart sounds.   Pulmonary/Chest: Effort normal and breath sounds normal.  Abdominal: Soft. Bowel sounds are normal. He exhibits no distension.  Musculoskeletal: He exhibits no edema and no tenderness.  Left BKA; self propels in WC, in bed at time of exam  Neurological: He is alert.  Skin: Skin is warm and dry. He is not diaphoretic.  Psychiatric: Affect normal.     Labs reviewed: Basic Metabolic Panel:  Recent Labs  05/02/14 0530 05/03/14 0705 05/04/14 0443  NA 153* 150* 145  K 4.5 3.7 3.7  CL 117* 112 107  CO2 _0 GLUCOSE 115* 137* 136*  BUN 49* 44* 35*  CREATININE 2.40* 2.03* 1.77*  CALCIUM 9.8 9.4 8.7   Liver Function Tests:  Recent Labs  01/30/14 2324 05/01/14 1713 05/02/14 0530  AST 17 50* 34  ALT 13 39 35  ALKPHOS 80 80 90  BILITOT <0.2* <0.2* 0.8  PROT 6.5 6.3 7.3  ALBUMIN 3.0* 2.3* 2.4*   No results found for this basename: LIPASE, AMYLASE,  in the last 8760 hours No  results found for this basename: AMMONIA,  in the last 8760 hours CBC:  Recent Labs  01/30/14 2324 01/30/14 2329 05/01/14 1713 05/02/14 0530  WBC 6.3  --  15.0* 15.2*  NEUTROABS 3.4  --  11.2* 11.6*  HGB 10.6* 11.6* 6.4* 11.8*  HCT 32.9* 34.0* 20.3* 37.2*  MCV 87.0  --  86.0 85.9  PLT 198  --  247 212   CBC with Diff  Result: 02/16/2014 5:47 PM ( Status:  F )  WBC 5.2 4.0-10.5 K/uL SLN  RBC 3.94 L 4.22-5.81 MIL/uL SLN  Hemoglobin 10.6 L 13.0-17.0 g/dL SLN  Hematocrit 33.1 L 39.0-52.0 % SLN  MCV 84.0 78.0-100.0 fL SLN  MCH 26.9 26.0-34.0 pg SLN  MCHC 32.0 30.0-36.0 g/dL SLN  RDW 16.0 H 11.5-15.5 % SLN  Platelet Count 192 150-400 K/uL SLN  Granulocyte % 69 43-77 % SLN  Absolute Gran 3.6 1.7-7.7 K/uL SLN  Lymph % 20 12-46 % SLN  Absolute Lymph 1.0 0.7-4.0 K/uL SLN  Mono % 7 3-12 % SLN  Absolute Mono 0.4 0.1-1.0 K/uL SLN  Eos % 3 0-5 % SLN  Absolute Eos 0.2 0.0-0.7 K/uL SLN  Baso % 1 0-1 % SLN  Absolute Baso 0.1 0.0-0.1 K/uL SLN  Smear Review Criteria for review not met SLN  Basic Metabolic Panel  Result: 6/64/4034 7:15 PM ( Status: F )  Sodium 150 H 135-145 mEq/L SLN  Potassium 4.1 3.5-5.3 mEq/L SLN  Chloride 114 H 96-112 mEq/L SLN  CO2 28 19-32 mEq/L SLN  Glucose 147 H 70-99 mg/dL SLN  BUN 41 H 6-23 mg/dL SLN  Creatinine 1.55 H 0.50-1.35 mg/dL SLN  Calcium 9.6  CBC NO Diff (Complete Blood Count)    Result: 06/21/2014 10:16 AM   ( Status: F )     C WBC 5.1     4.0-10.5 K/uL SLN   RBC 3.74   L 4.22-5.81 MIL/uL SLN   Hemoglobin 9.7   L 13.0-17.0 g/dL SLN   Hematocrit 31.1   L 39.0-52.0 % SLN   MCV 83.2     78.0-100.0 fL SLN   MCH 25.9   L 26.0-34.0 pg SLN   MCHC 31.2     30.0-36.0 g/dL SLN   RDW 16.3   H 11.5-15.5 % SLN   Platelet Count 194     150-400 K/uL SLN   Comprehensive Metabolic Panel    Result: 06/21/2014 11:17 AM   ( Status: F )       Sodium 145     135-145 mEq/L SLN   Potassium 4.4     3.5-5.3 mEq/L SLN   Chloride 112     96-112 mEq/L SLN   CO2 31       19-32 mEq/L SLN   Glucose 82     70-99 mg/dL SLN   BUN 39   H 6-23 mg/dL SLN   Creatinine 1.73   H 0.50-1.35 mg/dL SLN   Bilirubin, Total 0.3     0.2-1.2 mg/dL SLN   Alkaline Phosphatase 54     39-117 U/L SLN   AST/SGOT 12     0-37 U/L SLN   ALT/SGPT <8     0-53 U/L SLN   Total Protein 5.5   L 6.0-8.3 g/dL SLN   Albumin 2.9   L 3.5-5.2 g/dL SLN   Calcium 9.0   Assessment/Plan   1. Essential hypertension Elevated at this time, pt with blood pressures randing 160-180/60-80s. Currently on norvasc Will start hydralazine 10 mg TID at the time and have staff cont to monitor BP  2. Coronary artery disease involving native coronary artery of native heart without angina pectoris Without chest pains, conts on plavix   3. Bradycardia, sinus -conts to be asymptomatic, HR ranging from 52-74 on review over the last month   4. Dementia without behavioral disturbance -stable at this time, no worsening cognitive or functional status  5. Constipation, unspecified constipation type -reports constipation, will start senkot S q hs for  better control   6. Weight loss - Esophageal dysmotility found on UGI, started on reglan due to TID before meals to see if this helps early satiety and weight loss- appears to be helping but also working with restorative care which helps. Due to side effects with reglan will decrease to BID at this time

## 2014-10-01 ENCOUNTER — Non-Acute Institutional Stay (SKILLED_NURSING_FACILITY): Payer: Medicare Other | Admitting: Internal Medicine

## 2014-10-01 DIAGNOSIS — E870 Hyperosmolality and hypernatremia: Secondary | ICD-10-CM

## 2014-10-01 DIAGNOSIS — R1310 Dysphagia, unspecified: Secondary | ICD-10-CM

## 2014-10-01 DIAGNOSIS — R634 Abnormal weight loss: Secondary | ICD-10-CM

## 2014-10-07 NOTE — Progress Notes (Addendum)
Patient ID: Joseph ReedyJohn Ponce, male   DOB: 09-Sep-1924, 80 y.o.   MRN: [id number]               PROGRESS NOTE  DATE:  10/01/2014    [facility]    LEVEL OF CARE:   SNF   Acute Visit   CHIEF COMPLAINT:  Vomiting.    HISTORY OF PRESENT ILLNESS:  Joseph Ponce is a longstanding resident of this facility, having been in the building since 2005.    His predominant diagnosis is vascular dementia.  Joseph Ponce has also had a left BKA secondary to PAD.  Joseph Ponce also has a diagnosis of chronic hypernatremia, on DDAVP.    Today while eating breakfast, Joseph Ponce pointed to his lower sternum and said the food was getting stuck and then Joseph Ponce vomited.  Apparently, this was mucousy yellow material, not food as Joseph Ponce really had not eaten anything.  Joseph Ponce now states Joseph Ponce feels fine.  I spoke to the nurse on the floor.  This seems to be the first issue that she is aware of with regards to this type of complaint.    I note that in the summer, Joseph Ponce was noted to have been losing weight.  Joseph Ponce also had a complaint that sounded like early satiety.  I did an upper GI series on him which showed a Presbyesophagus without clear obstruction.  There was a suggestion of delayed gastric emptying.  We put him on some Reglan, which I notice has since been reduced.    Finally, Joseph Ponce is bradycardic with atrial fibrillation.  This is also not a new problem.  Joseph Ponce has remained asymptomatic from this, at least as far as I can tell.    LABORATORY DATA:   Last lab work was in July:    His white count was 8.1, hemoglobin 10, platelet count 329.    BUN was 34 and creatinine 1.9, which is stable for him.  His sodium was 143 and his albumin was 3.    REVIEW OF SYSTEMS:  Although the patient is demented, Joseph Ponce is still able to converse fairly clearly.   CHEST/RESPIRATORY:  Joseph Ponce does not complain of cough or shortness of breath.   CARDIAC:   No clear complaints of chest pain or palpitations.  No syncope.   GI:  Once again, Joseph Ponce points to his lower sternum and says that  this is the source of the vomiting Joseph Ponce had today.    PHYSICAL EXAMINATION:   CIRCULATION:   EDEMA/VARICOSITIES:  Extremities:  No evidence of a DVT.   MUSCULOSKELETAL:   EXTREMITIES:   LEFT LOWER EXTREMITY:  Previous BKA on the left.     ASSESSMENT/PLAN:        Vomiting with a complaint of dysphagia.  This is a new concern, although the upper GI series did not show an obvious obstructive problem, at least according to the study I did in July.  Joseph Ponce does have severe Presbyesophagus.  I am not sure there is much more we can do about this.  If this continues, Joseph Ponce may need to actually see GI for an endoscopy.  Lab work will be repeated next week.   Weight loss.  I need to follow up on this.    I suspect Joseph Ponce has sick sinus syndrome, although Joseph Ponce has remained asymptomatic from this and, at least as of now, I have not sent him for consideration of a pacemaker.    History of chronic renal failure and hypernatremia.  This will also need  to be rechecked.    Suggestion on his upper GI series in July of delayed gastric emptying, although this was certainly not a conclusive study.   I would like to maintain the Reglan.

## 2014-10-28 ENCOUNTER — Non-Acute Institutional Stay (SKILLED_NURSING_FACILITY): Payer: Medicare Other | Admitting: Nurse Practitioner

## 2014-10-28 DIAGNOSIS — N390 Urinary tract infection, site not specified: Secondary | ICD-10-CM

## 2014-10-28 DIAGNOSIS — I10 Essential (primary) hypertension: Secondary | ICD-10-CM

## 2014-10-28 DIAGNOSIS — D649 Anemia, unspecified: Secondary | ICD-10-CM

## 2014-10-28 DIAGNOSIS — I2510 Atherosclerotic heart disease of native coronary artery without angina pectoris: Secondary | ICD-10-CM

## 2014-10-28 DIAGNOSIS — K5900 Constipation, unspecified: Secondary | ICD-10-CM

## 2014-10-28 DIAGNOSIS — F0390 Unspecified dementia without behavioral disturbance: Secondary | ICD-10-CM

## 2014-10-28 DIAGNOSIS — R001 Bradycardia, unspecified: Secondary | ICD-10-CM

## 2014-10-28 DIAGNOSIS — R634 Abnormal weight loss: Secondary | ICD-10-CM

## 2014-10-28 NOTE — Progress Notes (Signed)
Patient ID: Joseph Ponce, male   DOB: Sep 26, 1924, 80 y.o.   MRN: [id number]    Nursing Home Location:  [place] of Service: SNF (31)  PCP: Cyndee Brightly, MD  No Known Allergies  Chief Complaint  Patient presents with  . Medical Management of Chronic Issues    HPI:  80 year old male who is a LTR of [facility] with a pmh of dementia, OP, FTT, CVA, anemia, hyperlipidemia, PVD and htn. Pt is being seen today for routine follow up on chronic conditions. In the last month pt had increased confusion and was found to have UTI. Currently being treated with macrobid for 7 days. Pts acute confusion has resolved. 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 Otherwise staff reports he is doing well. Eating and drinking well.   Review of Systems:  Review of Systems  Constitutional: Negative for fever, chills and malaise/fatigue.  Eyes: Negative.   Respiratory: Negative for cough and shortness of breath.   Cardiovascular: Negative for chest pain and leg swelling.  Gastrointestinal: Negative for heartburn, abdominal pain, diarrhea and constipation.  Genitourinary: Negative for dysuria, urgency and frequency.  Musculoskeletal: Negative for myalgias and joint pain.  Skin: Negative.   Neurological: Negative for dizziness, tremors, weakness and headaches.  Psychiatric/Behavioral: Positive for memory loss. Negative for depression. The patient is not nervous/anxious and does not have insomnia.      Past Medical History  Diagnosis Date  . Dementia   . Muscle weakness   . Osteoporosis   . Alzheimer disease   . Failure to thrive in adult   . Dementia   . CVA (cerebral vascular accident)     speech and language d/o deficits  . Weakness   . Hyperlipemia   . Dysphagia   . Anemia   . PVD (peripheral vascular disease)   . COPD (chronic obstructive pulmonary disease)   . Glaucoma (increased eye pressure)   . Hypertension   . S/P BKA (below knee amputation) unilateral 01/21/2006   left  . Renal artery stenosis    Past Surgical History  Procedure Laterality Date  . Leg amputation below knee      left   Social History:   reports that he has quit smoking. He does not have any smokeless tobacco history on file. He reports that he does not drink alcohol or use illicit drugs.  No family history on file.  Medications: Patient's Medications  New Prescriptions   No medications on file  Previous Medications   AMINO ACIDS-PROTEIN HYDROLYS (FEEDING SUPPLEMENT, PRO-STAT SUGAR FREE 64,) LIQD    Take 30 mLs by mouth 2 (two) times daily.   AMLODIPINE (NORVASC) 10 MG TABLET    Take 10 mg by mouth daily.   ASPIRIN 81 MG CHEWABLE TABLET    Chew 81 mg by mouth daily.   BRIMONIDINE (ALPHAGAN P) 0.1 % SOLN    Place 1 drop into both eyes 2 (two) times daily.   CALCIUM-VITAMIN D (OSCAL WITH D) 500-200 MG-UNIT PER TABLET    Take 1 tablet by mouth 2 (two) times daily.   CLOPIDOGREL (PLAVIX) 75 MG TABLET    Take 75 mg by mouth daily.   DENOSUMAB (PROLIA) 60 MG/ML SOLN INJECTION    Inject 60 mg into the skin every 6 (six) months.    DESMOPRESSIN (DDAVP) 0.01 % NASAL SOLUTION    Place 10 mcg into the nose 3 (three) times daily.   DORZOLAMIDE (TRUSOPT) 2 % OPHTHALMIC SOLUTION  Place 1 drop into both eyes 2 (two) times daily.   FEEDING SUPPLEMENT, RESOURCE BREEZE, (RESOURCE BREEZE) LIQD    Take 1 Container by mouth 2 (two) times daily between meals.   GABAPENTIN (NEURONTIN) 100 MG CAPSULE    Take 100 mg by mouth 2 (two) times daily.   HYDROCODONE-ACETAMINOPHEN (NORCO/VICODIN) 5-325 MG PER TABLET    Take one tablet by mouth twice daily for pain; Take one tablet by mouth every 6 hours as needed for pain   HYDROXYPROPYL METHYLCELLULOSE (ISOPTO TEARS) 2.5 % OPHTHALMIC SOLUTION    Place 1 drop into both eyes 3 (three) times daily.   MAGNESIUM HYDROXIDE (MILK OF MAGNESIA) 400 MG/5ML SUSPENSION    Take 30 mLs by mouth daily as needed. For constipation   MEMANTINE HCL ER (NAMENDA XR) 28 MG CP24     Take 28 mg by mouth daily.   MULTIPLE VITAMINS-MINERALS (CERTA-VITE PO)    Take 1 tablet by mouth daily.   NITROGLYCERIN (NITRODUR - DOSED IN MG/24 HR) 0.2 MG/HR    Place 1 patch onto the skin daily.   POLYETHYLENE GLYCOL (MIRALAX / GLYCOLAX) PACKET    Take 17 g by mouth daily.   TRAVOPROST, BAK FREE, (TRAVATAN) 0.004 % SOLN OPHTHALMIC SOLUTION    Place 1 drop into both eyes at bedtime.  Modified Medications   No medications on file  Discontinued Medications   No medications on file     Physical Exam:  Filed Vitals:   10/28/14 1201  BP: 152/62  Pulse: 56  Temp: 98.6 F (37 C)  Resp: 20  Weight: 175 lb (79.379 kg)    Physical Exam  Constitutional: He is well-developed, well-nourished, and in no distress.  HENT:  Mouth/Throat: Oropharynx is clear and moist. No oropharyngeal exudate.  Eyes: Conjunctivae and EOM are normal. Pupils are equal, round, and reactive to light.  Neck: Normal range of motion. Neck supple.  Cardiovascular: Normal rate, regular rhythm and normal heart sounds.   Pulmonary/Chest: Effort normal and breath sounds normal.  Abdominal: Soft. Bowel sounds are normal. He exhibits no distension.  Musculoskeletal: He exhibits no edema or tenderness.  Left BKA; self propels in [facility]  Neurological: He is alert.  Skin: Skin is warm and dry. He is not diaphoretic.  Psychiatric: Affect normal.  Vitals reviewed.    Labs reviewed: Basic Metabolic Panel:  Recent Labs  05/02/14 0530 05/03/14 0705 05/04/14 0443  NA 153* 150* 145  K 4.5 3.7 3.7  CL 117* 112 107  CO2 _0 GLUCOSE 115* 137* 136*  BUN 49* 44* 35*  CREATININE 2.40* 2.03* 1.77*  CALCIUM 9.8 9.4 8.7   Liver Function Tests:  Recent Labs  01/30/14 2324 05/01/14 1713 05/02/14 0530  AST 17 50* 34  ALT 13 39 35  ALKPHOS 80 80 90  BILITOT <0.2* <0.2* 0.8  PROT 6.5 6.3 7.3  ALBUMIN 3.0* 2.3* 2.4*   No results for input(s): LIPASE, AMYLASE in the last 8760 hours. No results for input(s):  AMMONIA in the last 8760 hours. CBC:  Recent Labs  01/30/14 2324 01/30/14 2329 05/01/14 1713 05/02/14 0530  WBC 6.3  --  15.0* 15.2*  NEUTROABS 3.4  --  11.2* 11.6*  HGB 10.6* 11.6* 6.4* 11.8*  HCT 32.9* 34.0* 20.3* 37.2*  MCV 87.0  --  86.0 85.9  PLT 198  --  247 212   CBC with Diff  Result: 02/16/2014 5:47 PM ( Status: F )  WBC 5.2 4.0-10.5 K/uL SLN  RBC 3.94 L 4.22-5.81 MIL/uL SLN  Hemoglobin 10.6 L 13.0-17.0 g/dL SLN  Hematocrit 33.1 L 39.0-52.0 % SLN  MCV 84.0 78.0-100.0 fL SLN  MCH 26.9 26.0-34.0 pg SLN  MCHC 32.0 30.0-36.0 g/dL SLN  RDW 16.0 H 11.5-15.5 % SLN  Platelet Count 192 150-400 K/uL SLN  Granulocyte % 69 43-77 % SLN  Absolute Gran 3.6 1.7-7.7 K/uL SLN  Lymph % 20 12-46 % SLN  Absolute Lymph 1.0 0.7-4.0 K/uL SLN  Mono % 7 3-12 % SLN  Absolute Mono 0.4 0.1-1.0 K/uL SLN  Eos % 3 0-5 % SLN  Absolute Eos 0.2 0.0-0.7 K/uL SLN  Baso % 1 0-1 % SLN  Absolute Baso 0.1 0.0-0.1 K/uL SLN  Smear Review Criteria for review not met SLN  Basic Metabolic Panel  Result: 3/83/8184 7:15 PM ( Status: F )  Sodium 150 H 135-145 mEq/L SLN  Potassium 4.1 3.5-5.3 mEq/L SLN  Chloride 114 H 96-112 mEq/L SLN  CO2 28 19-32 mEq/L SLN  Glucose 147 H 70-99 mg/dL SLN  BUN 41 H 6-23 mg/dL SLN  Creatinine 1.55 H 0.50-1.35 mg/dL SLN  Calcium 9.6  CBC NO Diff (Complete Blood Count)    Result: 06/21/2014 10:16 AM   ( Status: F )     C WBC 5.1     4.0-10.5 K/uL SLN   RBC 3.74   L 4.22-5.81 MIL/uL SLN   Hemoglobin 9.7   L 13.0-17.0 g/dL SLN   Hematocrit 31.1   L 39.0-52.0 % SLN   MCV 83.2     78.0-100.0 fL SLN   MCH 25.9   L 26.0-34.0 pg SLN   MCHC 31.2     30.0-36.0 g/dL SLN   RDW 16.3   H 11.5-15.5 % SLN   Platelet Count 194     150-400 K/uL SLN   Comprehensive Metabolic Panel    Result: 06/21/2014 11:17 AM   ( Status: F )       Sodium 145     135-145 mEq/L SLN   Potassium 4.4     3.5-5.3 mEq/L SLN   Chloride 112     96-112 mEq/L SLN   CO2 31     19-32 mEq/L SLN   Glucose 82       70-99 mg/dL SLN   BUN 39   H 6-23 mg/dL SLN   Creatinine 1.73   H 0.50-1.35 mg/dL SLN   Bilirubin, Total 0.3     0.2-1.2 mg/dL SLN   Alkaline Phosphatase 54     39-117 U/L SLN   AST/SGOT 12     0-37 U/L SLN   ALT/SGPT <8     0-53 U/L SLN   Total Protein 5.5   L 6.0-8.3 g/dL SLN   Albumin 2.9   L 3.5-5.2 g/dL SLN   Calcium 9.0    CBC with Diff    Result: 10/07/2014 11:29 AM   ( Status: F )     C WBC 5.4     4.0-10.5 K/uL SLN   RBC 3.90   L 4.22-5.81 MIL/uL SLN   Hemoglobin 10.2   L 13.0-17.0 g/dL SLN   Hematocrit 31.9   L 39.0-52.0 % SLN   MCV 81.8     78.0-100.0 fL SLN   MCH 26.2     26.0-34.0 pg SLN   MCHC 32.0     30.0-36.0 g/dL SLN   RDW 15.1     11.5-15.5 % SLN   Platelet Count 179     150-400 K/uL  SLN   Granulocyte % 50     43-77 % SLN   Absolute Gran 2.7     1.7-7.7 K/uL SLN   Lymph % 33     12-46 % SLN   Absolute Lymph 1.8     0.7-4.0 K/uL SLN   Mono % 11     3-12 % SLN   Absolute Mono 0.6     0.1-1.0 K/uL SLN   Eos % 5     0-5 % SLN   Absolute Eos 0.3     0.0-0.7 K/uL SLN   Baso % 1     0-1 % SLN   Absolute Baso 0.1     0.0-0.1 K/uL SLN   Smear Review Criteria for review not met  SLN   MPV 10.1     9.4-12.4 fL SLN   Comprehensive Metabolic Panel    Result: 10/07/2014 12:24 PM   ( Status: F )       Sodium 147   H 135-145 mEq/L SLN   Potassium 4.5     3.5-5.3 mEq/L SLN   Chloride 117   H 96-112 mEq/L SLN   CO2 24     19-32 mEq/L SLN   Glucose 88     70-99 mg/dL SLN   BUN 37   H 6-23 mg/dL SLN   Creatinine 1.63   H 0.50-1.35 mg/dL SLN   Bilirubin, Total 0.3     0.2-1.2 mg/dL SLN   Alkaline Phosphatase 53     39-117 U/L SLN   AST/SGOT 12     0-37 U/L SLN   ALT/SGPT <8     0-53 U/L SLN   Total Protein 5.5   L 6.0-8.3 g/dL SLN   Albumin 3.2   L 3.5-5.2 g/dL SLN   Calcium 8.9      Assessment/Plan    1. Essential hypertension Stable at this time, will cont current medications  2. Coronary artery disease involving native coronary artery of native heart  without angina pectoris Without chest pains, conts on plavix   3. Bradycardia, sinus -conts to be asymptomatic, HR ranging from 54-80s on review   4. Dementia without behavioral disturbance -stable at this time conts namenda, no worsening cognitive or functional status  5. Constipation, unspecified constipation type -no complaints at this time  6. Weight loss - has had positive gain in the last monht  7. Anemia, unspecified anemia type Improved on recent labs  8. UTI (lower urinary tract infection) No current symptoms, conts macrobid for a total course of 7 days.

## 2014-11-11 ENCOUNTER — Other Ambulatory Visit: Payer: Self-pay | Admitting: *Deleted

## 2014-11-11 MED ORDER — HYDROCODONE-ACETAMINOPHEN 5-325 MG PO TABS: ORAL_TABLET | ORAL | Status: DC

## 2014-11-11 NOTE — Telephone Encounter (Signed)
[facility] 

## 2014-11-25 ENCOUNTER — Non-Acute Institutional Stay (SKILLED_NURSING_FACILITY): Payer: Medicare Other | Admitting: Nurse Practitioner

## 2014-11-25 DIAGNOSIS — R001 Bradycardia, unspecified: Secondary | ICD-10-CM

## 2014-11-25 DIAGNOSIS — F0390 Unspecified dementia without behavioral disturbance: Secondary | ICD-10-CM

## 2014-11-25 DIAGNOSIS — Z89512 Acquired absence of left leg below knee: Secondary | ICD-10-CM

## 2014-11-25 DIAGNOSIS — I10 Essential (primary) hypertension: Secondary | ICD-10-CM

## 2014-11-25 DIAGNOSIS — M810 Age-related osteoporosis without current pathological fracture: Secondary | ICD-10-CM

## 2014-11-25 DIAGNOSIS — J449 Chronic obstructive pulmonary disease, unspecified: Secondary | ICD-10-CM

## 2014-11-25 DIAGNOSIS — K5900 Constipation, unspecified: Secondary | ICD-10-CM

## 2014-11-25 NOTE — Progress Notes (Signed)
Patient ID: Joseph Ponce, male   DOB: 09/15/1924, 81 y.o.   MRN: [id number]    Nursing Home Location:  [place] of Service: SNF (31)  PCP: Cyndee Brightly, MD  No Known Allergies  Chief Complaint  Patient presents with  . Medical Management of Chronic Issues    HPI:  81 y.o.  male who is a LTR of [place] with a pmh of dementia, OP, FTT, CVA, anemia, hyperlipidemia, PVD and htn. Pt is being seen today for routine follow up on chronic conditions. pt has been in his usual health over the last month.  Staff reports occasionally will be more confused. No behaviors noted. Therapy is getting new LE prosthetic for pt.  Weight has gradually improved. RD following and has decreased supplements.  Review of Systems:  Review of Systems  Constitutional: Negative for fever, chills and weight loss.  HENT: Negative for tinnitus.   Respiratory: Negative for cough, sputum production and shortness of breath.   Cardiovascular: Negative for chest pain, palpitations and leg swelling.  Gastrointestinal: Negative for heartburn, abdominal pain, diarrhea and constipation.  Genitourinary: Negative for dysuria, urgency and frequency.  Musculoskeletal: Negative for myalgias, back pain, joint pain and falls.  Skin: Negative.   Neurological: Negative for dizziness and headaches.  Psychiatric/Behavioral: Positive for memory loss. Negative for depression. The patient does not have insomnia.      Past Medical History  Diagnosis Date  . Dementia   . Muscle weakness   . Osteoporosis   . Alzheimer disease   . Failure to thrive in adult   . Dementia   . CVA (cerebral vascular accident)     speech and language d/o deficits  . Weakness   . Hyperlipemia   . Dysphagia   . Anemia   . PVD (peripheral vascular disease)   . COPD (chronic obstructive pulmonary disease)   . Glaucoma (increased eye pressure)   . Hypertension   . S/P BKA (below knee amputation) unilateral 01/21/2006     left  . Renal artery stenosis    Past Surgical History  Procedure Laterality Date  . Leg amputation below knee      left   Social History:   reports that he has quit smoking. He does not have any smokeless tobacco history on file. He reports that he does not drink alcohol or use illicit drugs.  No family history on file.  Medications: Patient's Medications  New Prescriptions   No medications on file  Previous Medications   AMINO ACIDS-PROTEIN HYDROLYS (FEEDING SUPPLEMENT, PRO-STAT SUGAR FREE 64,) LIQD    Take 30 mLs by mouth 2 (two) times daily.   AMLODIPINE (NORVASC) 10 MG TABLET    Take 10 mg by mouth daily.   ASPIRIN 81 MG CHEWABLE TABLET    Chew 81 mg by mouth daily.   BRIMONIDINE (ALPHAGAN P) 0.1 % SOLN    Place 1 drop into both eyes 2 (two) times daily.   CALCIUM-VITAMIN D (OSCAL WITH D) 500-200 MG-UNIT PER TABLET    Take 1 tablet by mouth 2 (two) times daily.   CLOPIDOGREL (PLAVIX) 75 MG TABLET    Take 75 mg by mouth daily.   DENOSUMAB (PROLIA) 60 MG/ML SOLN INJECTION    Inject 60 mg into the skin every 6 (six) months.    DESMOPRESSIN (DDAVP) 0.01 % NASAL SOLUTION    Place 10 mcg into the nose 3 (three) times daily.   DORZOLAMIDE (TRUSOPT) 2 % OPHTHALMIC SOLUTION  Place 1 drop into both eyes 2 (two) times daily.   FEEDING SUPPLEMENT, RESOURCE BREEZE, (RESOURCE BREEZE) LIQD    Take 1 Container by mouth 2 (two) times daily between meals.   GABAPENTIN (NEURONTIN) 100 MG CAPSULE    Take 100 mg by mouth 2 (two) times daily.   HYDRALAZINE (APRESOLINE) 10 MG TABLET    Take 10 mg by mouth 3 (three) times daily.   HYDROCODONE-ACETAMINOPHEN (NORCO/VICODIN) 5-325 MG PER TABLET    Take one tablet by mouth twice daily for chronic pain   HYDROXYPROPYL METHYLCELLULOSE (ISOPTO TEARS) 2.5 % OPHTHALMIC SOLUTION    Place 1 drop into both eyes 3 (three) times daily.   MAGNESIUM HYDROXIDE (MILK OF MAGNESIA) 400 MG/5ML SUSPENSION    Take 30 mLs by mouth daily as needed. For constipation    MEMANTINE HCL ER (NAMENDA XR) 28 MG CP24    Take 28 mg by mouth daily.   MULTIPLE VITAMINS-MINERALS (CERTA-VITE PO)    Take 1 tablet by mouth daily.   NITROGLYCERIN (NITRODUR - DOSED IN MG/24 HR) 0.2 MG/HR    Place 1 patch onto the skin daily.   POLYETHYLENE GLYCOL (MIRALAX / GLYCOLAX) PACKET    Take 17 g by mouth daily.   TRAVOPROST, BAK FREE, (TRAVATAN) 0.004 % SOLN OPHTHALMIC SOLUTION    Place 1 drop into both eyes at bedtime.  Modified Medications   No medications on file  Discontinued Medications   No medications on file     Physical Exam:  Filed Vitals:   11/25/14 1159  BP: 162/88  Pulse: 72  Temp: 98.6 F (37 C)  Resp: 20  Weight: 170 lb (77.111 kg)    Physical Exam  Constitutional: He is well-developed, well-nourished, and in no distress.  HENT:  Mouth/Throat: Oropharynx is clear and moist. No oropharyngeal exudate.  Eyes: Conjunctivae and EOM are normal. Pupils are equal, round, and reactive to light.  Neck: Normal range of motion. Neck supple.  Cardiovascular: Normal rate, regular rhythm and normal heart sounds.   Pulmonary/Chest: Effort normal and breath sounds normal.  Abdominal: Soft. Bowel sounds are normal. He exhibits no distension.  Musculoskeletal: He exhibits no edema or tenderness.  Left BKA; self propels in [facility]  Neurological: He is alert.  Skin: Skin is warm and dry. He is not diaphoretic.  Psychiatric: Affect normal.  Vitals reviewed.    Labs reviewed: Basic Metabolic Panel:  Recent Labs  05/02/14 0530 05/03/14 0705 05/04/14 0443  NA 153* 150* 145  K 4.5 3.7 3.7  CL 117* 112 107  CO2 '19 25 25  ' GLUCOSE 115* 137* 136*  BUN 49* 44* 35*  CREATININE 2.40* 2.03* 1.77*  CALCIUM 9.8 9.4 8.7   Liver Function Tests:  Recent Labs  01/30/14 2324 05/01/14 1713 05/02/14 0530  AST 17 50* 34  ALT 13 39 35  ALKPHOS 80 80 90  BILITOT <0.2* <0.2* 0.8  PROT 6.5 6.3 7.3  ALBUMIN 3.0* 2.3* 2.4*   No results for input(s): LIPASE, AMYLASE in the  last 8760 hours. No results for input(s): AMMONIA in the last 8760 hours. CBC:  Recent Labs  01/30/14 2324 01/30/14 2329 05/01/14 1713 05/02/14 0530  WBC 6.3  --  15.0* 15.2*  NEUTROABS 3.4  --  11.2* 11.6*  HGB 10.6* 11.6* 6.4* 11.8*  HCT 32.9* 34.0* 20.3* 37.2*  MCV 87.0  --  86.0 85.9  PLT 198  --  247 212   CBC with Diff  Result: 02/16/2014 5:47 PM ( Status: F )  WBC 5.2 4.0-10.5 K/uL SLN  RBC 3.94 L 4.22-5.81 MIL/uL SLN  Hemoglobin 10.6 L 13.0-17.0 g/dL SLN  Hematocrit 33.1 L 39.0-52.0 % SLN  MCV 84.0 78.0-100.0 fL SLN  MCH 26.9 26.0-34.0 pg SLN  MCHC 32.0 30.0-36.0 g/dL SLN  RDW 16.0 H 11.5-15.5 % SLN  Platelet Count 192 150-400 K/uL SLN  Granulocyte % 69 43-77 % SLN  Absolute Gran 3.6 1.7-7.7 K/uL SLN  Lymph % 20 12-46 % SLN  Absolute Lymph 1.0 0.7-4.0 K/uL SLN  Mono % 7 3-12 % SLN  Absolute Mono 0.4 0.1-1.0 K/uL SLN  Eos % 3 0-5 % SLN  Absolute Eos 0.2 0.0-0.7 K/uL SLN  Baso % 1 0-1 % SLN  Absolute Baso 0.1 0.0-0.1 K/uL SLN  Smear Review Criteria for review not met SLN  Basic Metabolic Panel  Result: 02/15/7352 7:15 PM ( Status: F )  Sodium 150 H 135-145 mEq/L SLN  Potassium 4.1 3.5-5.3 mEq/L SLN  Chloride 114 H 96-112 mEq/L SLN  CO2 28 19-32 mEq/L SLN  Glucose 147 H 70-99 mg/dL SLN  BUN 41 H 6-23 mg/dL SLN  Creatinine 1.55 H 0.50-1.35 mg/dL SLN  Calcium 9.6  CBC NO Diff (Complete Blood Count)    Result: 06/21/2014 10:16 AM   ( Status: F )     C WBC 5.1     4.0-10.5 K/uL SLN   RBC 3.74   L 4.22-5.81 MIL/uL SLN   Hemoglobin 9.7   L 13.0-17.0 g/dL SLN   Hematocrit 31.1   L 39.0-52.0 % SLN   MCV 83.2     78.0-100.0 fL SLN   MCH 25.9   L 26.0-34.0 pg SLN   MCHC 31.2     30.0-36.0 g/dL SLN   RDW 16.3   H 11.5-15.5 % SLN   Platelet Count 194     150-400 K/uL SLN   Comprehensive Metabolic Panel    Result: 06/21/2014 11:17 AM   ( Status: F )       Sodium 145     135-145 mEq/L SLN   Potassium 4.4     3.5-5.3 mEq/L SLN   Chloride 112     96-112 mEq/L SLN     CO2 31     19-32 mEq/L SLN   Glucose 82     70-99 mg/dL SLN   BUN 39   H 6-23 mg/dL SLN   Creatinine 1.73   H 0.50-1.35 mg/dL SLN   Bilirubin, Total 0.3     0.2-1.2 mg/dL SLN   Alkaline Phosphatase 54     39-117 U/L SLN   AST/SGOT 12     0-37 U/L SLN   ALT/SGPT <8     0-53 U/L SLN   Total Protein 5.5   L 6.0-8.3 g/dL SLN   Albumin 2.9   L 3.5-5.2 g/dL SLN   Calcium 9.0    CBC with Diff    Result: 10/07/2014 11:29 AM   ( Status: F )     C WBC 5.4     4.0-10.5 K/uL SLN   RBC 3.90   L 4.22-5.81 MIL/uL SLN   Hemoglobin 10.2   L 13.0-17.0 g/dL SLN   Hematocrit 31.9   L 39.0-52.0 % SLN   MCV 81.8     78.0-100.0 fL SLN   MCH 26.2     26.0-34.0 pg SLN   MCHC 32.0     30.0-36.0 g/dL SLN   RDW 15.1     11.5-15.5 % SLN   Platelet Count 179  150-400 K/uL SLN   Granulocyte % 50     43-77 % SLN   Absolute Gran 2.7     1.7-7.7 K/uL SLN   Lymph % 33     12-46 % SLN   Absolute Lymph 1.8     0.7-4.0 K/uL SLN   Mono % 11     3-12 % SLN   Absolute Mono 0.6     0.1-1.0 K/uL SLN   Eos % 5     0-5 % SLN   Absolute Eos 0.3     0.0-0.7 K/uL SLN   Baso % 1     0-1 % SLN   Absolute Baso 0.1     0.0-0.1 K/uL SLN   Smear Review Criteria for review not met  SLN   MPV 10.1     9.4-12.4 fL SLN   Comprehensive Metabolic Panel    Result: 10/07/2014 12:24 PM   ( Status: F )       Sodium 147   H 135-145 mEq/L SLN   Potassium 4.5     3.5-5.3 mEq/L SLN   Chloride 117   H 96-112 mEq/L SLN   CO2 24     19-32 mEq/L SLN   Glucose 88     70-99 mg/dL SLN   BUN 37   H 6-23 mg/dL SLN   Creatinine 1.63   H 0.50-1.35 mg/dL SLN   Bilirubin, Total 0.3     0.2-1.2 mg/dL SLN   Alkaline Phosphatase 53     39-117 U/L SLN   AST/SGOT 12     0-37 U/L SLN   ALT/SGPT <8     0-53 U/L SLN   Total Protein 5.5   L 6.0-8.3 g/dL SLN   Albumin 3.2   L 3.5-5.2 g/dL SLN   Calcium 8.9      Assessment/Plan   1. Essential hypertension Blood pressure not at goal sbp ranging in the 160-170s Will increase  Hydralazine to 25 mg  TID   2. Bradycardia, sinus Stable at this time  3. Constipation, unspecified constipation type No reports of constipation  4. Dementia without behavioral disturbance -increased confusion at times, overall as been stable  5. Osteoporosis conts prolia  6. S/P BKA (below knee amputation) unilateral, left -working with therapy, needing a need prosthesis which is being ordered   7. Chronic obstructive pulmonary disease, unspecified COPD, unspecified chronic bronchitis type Stable at this time

## 2014-12-02 ENCOUNTER — Other Ambulatory Visit: Payer: Self-pay | Admitting: *Deleted

## 2014-12-02 MED ORDER — HYDROCODONE-ACETAMINOPHEN 5-325 MG PO TABS: ORAL_TABLET | ORAL | Status: DC

## 2014-12-02 NOTE — Telephone Encounter (Signed)
[facility] 

## 2014-12-16 ENCOUNTER — Non-Acute Institutional Stay (SKILLED_NURSING_FACILITY): Payer: Medicare Other | Admitting: Nurse Practitioner

## 2014-12-16 DIAGNOSIS — G894 Chronic pain syndrome: Secondary | ICD-10-CM

## 2014-12-16 DIAGNOSIS — J449 Chronic obstructive pulmonary disease, unspecified: Secondary | ICD-10-CM

## 2014-12-16 DIAGNOSIS — Z89512 Acquired absence of left leg below knee: Secondary | ICD-10-CM | POA: Diagnosis not present

## 2014-12-16 DIAGNOSIS — F0390 Unspecified dementia without behavioral disturbance: Secondary | ICD-10-CM | POA: Diagnosis not present

## 2014-12-16 DIAGNOSIS — M810 Age-related osteoporosis without current pathological fracture: Secondary | ICD-10-CM

## 2014-12-16 DIAGNOSIS — I10 Essential (primary) hypertension: Secondary | ICD-10-CM

## 2014-12-16 DIAGNOSIS — K5900 Constipation, unspecified: Secondary | ICD-10-CM

## 2014-12-16 DIAGNOSIS — H409 Unspecified glaucoma: Secondary | ICD-10-CM

## 2014-12-16 DIAGNOSIS — G4700 Insomnia, unspecified: Secondary | ICD-10-CM | POA: Diagnosis not present

## 2014-12-16 DIAGNOSIS — R001 Bradycardia, unspecified: Secondary | ICD-10-CM | POA: Diagnosis not present

## 2014-12-16 DIAGNOSIS — I2510 Atherosclerotic heart disease of native coronary artery without angina pectoris: Secondary | ICD-10-CM

## 2014-12-16 NOTE — Progress Notes (Signed)
Patient ID: Joseph Ponce, male   DOB: 1924/09/14, 81 y.o.   MRN: [id number]    Nursing Home Location:  [place] of Service: SNF (31)  PCP: Cyndee Brightly, MD  No Known Allergies  Chief Complaint  Patient presents with  . Medical Management of Chronic Issues    HPI:  81 y.o.  male who is a LTR of [place] with a pmh of dementia, OP, FTT, CVA, anemia, hyperlipidemia, PVD and htn. Pt is being seen today for routine follow up on chronic conditions. pt has been in his usual health over the last month without acute illnesses.   pts blood pressure have been consistently hight despite increased medication last month. Pt has been compliant with medications. Staff has no concerns at this time.   Review of Systems:  Review of Systems  Constitutional: Negative for fever, chills and weight loss.  Respiratory: Negative for cough, sputum production and shortness of breath.   Cardiovascular: Negative for chest pain, palpitations and leg swelling.  Gastrointestinal: Negative for heartburn, diarrhea and constipation.  Genitourinary: Negative for dysuria.  Musculoskeletal: Negative for myalgias, joint pain and falls.  Skin: Negative.   Neurological: Negative for dizziness and headaches.  Psychiatric/Behavioral: Positive for memory loss. Negative for depression. The patient does not have insomnia.      Past Medical History  Diagnosis Date  . Dementia   . Muscle weakness   . Osteoporosis   . Alzheimer disease   . Failure to thrive in adult   . Dementia   . CVA (cerebral vascular accident)     speech and language d/o deficits  . Weakness   . Hyperlipemia   . Dysphagia   . Anemia   . PVD (peripheral vascular disease)   . COPD (chronic obstructive pulmonary disease)   . Glaucoma (increased eye pressure)   . Hypertension   . S/P BKA (below knee amputation) unilateral 01/21/2006    left  . Renal artery stenosis    Past Surgical History  Procedure  Laterality Date  . Leg amputation below knee      left   Social History:   reports that he has quit smoking. He does not have any smokeless tobacco history on file. He reports that he does not drink alcohol or use illicit drugs.  No family history on file.  Medications: Patient's Medications  New Prescriptions   No medications on file  Previous Medications   AMINO ACIDS-PROTEIN HYDROLYS (FEEDING SUPPLEMENT, PRO-STAT SUGAR FREE 64,) LIQD    Take 30 mLs by mouth 2 (two) times daily.   AMLODIPINE (NORVASC) 10 MG TABLET    Take 10 mg by mouth daily.   ASPIRIN 81 MG CHEWABLE TABLET    Chew 81 mg by mouth daily.   BRIMONIDINE (ALPHAGAN P) 0.1 % SOLN    Place 1 drop into both eyes 2 (two) times daily.   CALCIUM-VITAMIN D (OSCAL WITH D) 500-200 MG-UNIT PER TABLET    Take 1 tablet by mouth 2 (two) times daily.   CLOPIDOGREL (PLAVIX) 75 MG TABLET    Take 75 mg by mouth daily.   DENOSUMAB (PROLIA) 60 MG/ML SOLN INJECTION    Inject 60 mg into the skin every 6 (six) months.    DESMOPRESSIN (DDAVP) 0.01 % NASAL SOLUTION    Place 10 mcg into the nose 3 (three) times daily.   DORZOLAMIDE (TRUSOPT) 2 % OPHTHALMIC SOLUTION    Place 1 drop into both eyes 2 (two) times daily.  FEEDING SUPPLEMENT, RESOURCE BREEZE, (RESOURCE BREEZE) LIQD    Take 1 Container by mouth 2 (two) times daily between meals.   GABAPENTIN (NEURONTIN) 100 MG CAPSULE    Take 100 mg by mouth 2 (two) times daily.   HYDRALAZINE (APRESOLINE) 10 MG TABLET    Take 25 mg by mouth 3 (three) times daily.    HYDROCODONE-ACETAMINOPHEN (NORCO/VICODIN) 5-325 MG PER TABLET    Take one tablet by mouth twice daily for chronic pain. Do not exceed 4gm of Tylenol in 24 hours.   HYDROXYPROPYL METHYLCELLULOSE (ISOPTO TEARS) 2.5 % OPHTHALMIC SOLUTION    Place 1 drop into both eyes 3 (three) times daily.   MAGNESIUM HYDROXIDE (MILK OF MAGNESIA) 400 MG/5ML SUSPENSION    Take 30 mLs by mouth daily as needed. For constipation   MEMANTINE HCL ER (NAMENDA XR) 28  MG CP24    Take 28 mg by mouth daily.   MULTIPLE VITAMINS-MINERALS (CERTA-VITE PO)    Take 1 tablet by mouth daily.   NITROGLYCERIN (NITRODUR - DOSED IN MG/24 HR) 0.2 MG/HR    Place 1 patch onto the skin daily.   POLYETHYLENE GLYCOL (MIRALAX / GLYCOLAX) PACKET    Take 17 g by mouth daily.   TRAVOPROST, BAK FREE, (TRAVATAN) 0.004 % SOLN OPHTHALMIC SOLUTION    Place 1 drop into both eyes at bedtime.  Modified Medications   No medications on file  Discontinued Medications   No medications on file     Physical Exam:  Filed Vitals:   12/16/14 1748  BP: 176/88  Pulse: 56  Temp: 97.2 F (36.2 C)  Resp: 20  Weight: 164 lb (74.39 kg)    Physical Exam  Constitutional: He is well-developed, well-nourished, and in no distress.  HENT:  Mouth/Throat: Oropharynx is clear and moist. No oropharyngeal exudate.  Eyes: Conjunctivae and EOM are normal. Pupils are equal, round, and reactive to light.  Neck: Normal range of motion. Neck supple.  Cardiovascular: Normal rate, regular rhythm and normal heart sounds.   Pulmonary/Chest: Effort normal and breath sounds normal.  Abdominal: Soft. Bowel sounds are normal. He exhibits no distension.  Musculoskeletal: He exhibits no edema or tenderness.  Left BKA; self propels in [facility]  Neurological: He is alert.  Skin: Skin is warm and dry. He is not diaphoretic.  Psychiatric: Affect normal.  Vitals reviewed.    Labs reviewed: Basic Metabolic Panel:  Recent Labs  05/02/14 0530 05/03/14 0705 05/04/14 0443  NA 153* 150* 145  K 4.5 3.7 3.7  CL 117* 112 107  CO2 _0 GLUCOSE 115* 137* 136*  BUN 49* 44* 35*  CREATININE 2.40* 2.03* 1.77*  CALCIUM 9.8 9.4 8.7   Liver Function Tests:  Recent Labs  01/30/14 2324 05/01/14 1713 05/02/14 0530  AST 17 50* 34  ALT 13 39 35  ALKPHOS 80 80 90  BILITOT <0.2* <0.2* 0.8  PROT 6.5 6.3 7.3  ALBUMIN 3.0* 2.3* 2.4*   No results for input(s): LIPASE, AMYLASE in the last 8760 hours. No results for  input(s): AMMONIA in the last 8760 hours. CBC:  Recent Labs  01/30/14 2324 01/30/14 2329 05/01/14 1713 05/02/14 0530  WBC 6.3  --  15.0* 15.2*  NEUTROABS 3.4  --  11.2* 11.6*  HGB 10.6* 11.6* 6.4* 11.8*  HCT 32.9* 34.0* 20.3* 37.2*  MCV 87.0  --  86.0 85.9  PLT 198  --  247 212   CBC with Diff  Result: 02/16/2014 5:47 PM ( Status: F )  WBC 5.2 4.0-10.5 K/uL SLN  RBC 3.94 L 4.22-5.81 MIL/uL SLN  Hemoglobin 10.6 L 13.0-17.0 g/dL SLN  Hematocrit 33.1 L 39.0-52.0 % SLN  MCV 84.0 78.0-100.0 fL SLN  MCH 26.9 26.0-34.0 pg SLN  MCHC 32.0 30.0-36.0 g/dL SLN  RDW 16.0 H 11.5-15.5 % SLN  Platelet Count 192 150-400 K/uL SLN  Granulocyte % 69 43-77 % SLN  Absolute Gran 3.6 1.7-7.7 K/uL SLN  Lymph % 20 12-46 % SLN  Absolute Lymph 1.0 0.7-4.0 K/uL SLN  Mono % 7 3-12 % SLN  Absolute Mono 0.4 0.1-1.0 K/uL SLN  Eos % 3 0-5 % SLN  Absolute Eos 0.2 0.0-0.7 K/uL SLN  Baso % 1 0-1 % SLN  Absolute Baso 0.1 0.0-0.1 K/uL SLN  Smear Review Criteria for review not met SLN  Basic Metabolic Panel  Result: 3/84/6659 7:15 PM ( Status: F )  Sodium 150 H 135-145 mEq/L SLN  Potassium 4.1 3.5-5.3 mEq/L SLN  Chloride 114 H 96-112 mEq/L SLN  CO2 28 19-32 mEq/L SLN  Glucose 147 H 70-99 mg/dL SLN  BUN 41 H 6-23 mg/dL SLN  Creatinine 1.55 H 0.50-1.35 mg/dL SLN  Calcium 9.6  CBC NO Diff (Complete Blood Count)    Result: 06/21/2014 10:16 AM   ( Status: F )     C WBC 5.1     4.0-10.5 K/uL SLN   RBC 3.74   L 4.22-5.81 MIL/uL SLN   Hemoglobin 9.7   L 13.0-17.0 g/dL SLN   Hematocrit 31.1   L 39.0-52.0 % SLN   MCV 83.2     78.0-100.0 fL SLN   MCH 25.9   L 26.0-34.0 pg SLN   MCHC 31.2     30.0-36.0 g/dL SLN   RDW 16.3   H 11.5-15.5 % SLN   Platelet Count 194     150-400 K/uL SLN   Comprehensive Metabolic Panel    Result: 06/21/2014 11:17 AM   ( Status: F )       Sodium 145     135-145 mEq/L SLN   Potassium 4.4     3.5-5.3 mEq/L SLN   Chloride 112     96-112 mEq/L SLN   CO2 31     19-32 mEq/L SLN     Glucose 82     70-99 mg/dL SLN   BUN 39   H 6-23 mg/dL SLN   Creatinine 1.73   H 0.50-1.35 mg/dL SLN   Bilirubin, Total 0.3     0.2-1.2 mg/dL SLN   Alkaline Phosphatase 54     39-117 U/L SLN   AST/SGOT 12     0-37 U/L SLN   ALT/SGPT <8     0-53 U/L SLN   Total Protein 5.5   L 6.0-8.3 g/dL SLN   Albumin 2.9   L 3.5-5.2 g/dL SLN   Calcium 9.0    CBC with Diff    Result: 10/07/2014 11:29 AM   ( Status: F )     C WBC 5.4     4.0-10.5 K/uL SLN   RBC 3.90   L 4.22-5.81 MIL/uL SLN   Hemoglobin 10.2   L 13.0-17.0 g/dL SLN   Hematocrit 31.9   L 39.0-52.0 % SLN   MCV 81.8     78.0-100.0 fL SLN   MCH 26.2     26.0-34.0 pg SLN   MCHC 32.0     30.0-36.0 g/dL SLN   RDW 15.1     11.5-15.5 % SLN   Platelet Count 179  150-400 K/uL SLN   Granulocyte % 50     43-77 % SLN   Absolute Gran 2.7     1.7-7.7 K/uL SLN   Lymph % 33     12-46 % SLN   Absolute Lymph 1.8     0.7-4.0 K/uL SLN   Mono % 11     3-12 % SLN   Absolute Mono 0.6     0.1-1.0 K/uL SLN   Eos % 5     0-5 % SLN   Absolute Eos 0.3     0.0-0.7 K/uL SLN   Baso % 1     0-1 % SLN   Absolute Baso 0.1     0.0-0.1 K/uL SLN   Smear Review Criteria for review not met  SLN   MPV 10.1     9.4-12.4 fL SLN   Comprehensive Metabolic Panel    Result: 10/07/2014 12:24 PM   ( Status: F )       Sodium 147   H 135-145 mEq/L SLN   Potassium 4.5     3.5-5.3 mEq/L SLN   Chloride 117   H 96-112 mEq/L SLN   CO2 24     19-32 mEq/L SLN   Glucose 88     70-99 mg/dL SLN   BUN 37   H 6-23 mg/dL SLN   Creatinine 1.63   H 0.50-1.35 mg/dL SLN   Bilirubin, Total 0.3     0.2-1.2 mg/dL SLN   Alkaline Phosphatase 53     39-117 U/L SLN   AST/SGOT 12     0-37 U/L SLN   ALT/SGPT <8     0-53 U/L SLN   Total Protein 5.5   L 6.0-8.3 g/dL SLN   Albumin 3.2   L 3.5-5.2 g/dL SLN   Calcium 8.9      Assessment/Plan  1. Essential hypertension Worsening blood pressure, -pt currently on norvasc 10 mg and hydralazine 25 mg TID, will increase to 50 mg TID and check  BMP  2. Bradycardia, sinus -HR has been stable.   3. Chronic obstructive pulmonary disease, unspecified COPD, unspecified chronic bronchitis type Stable at this time. No acute exacerbations   4. Constipation, unspecified constipation type Well controlled on current medications  5. Dementia without behavioral disturbance Without significant changes in cognitive or functional status  6. Insomnia Stable on current regimen  7. S/P BKA (below knee amputation) unilateral, left Awaiting on prosthetics, working with therapy    8. Glaucoma (increased eye pressure) conts to follow up with opthalmology, conts drops.    9. Chronic pain syndrome Stable on current pain regimen   10. Osteoporosis conts on cal and vit d and prolia  11. Coronary artery disease involving native coronary artery of native heart without angina pectoris Without chest pains, conts on plavix and ASA

## 2014-12-17 ENCOUNTER — Non-Acute Institutional Stay (SKILLED_NURSING_FACILITY): Payer: Medicare Other | Admitting: Internal Medicine

## 2014-12-17 DIAGNOSIS — I10 Essential (primary) hypertension: Secondary | ICD-10-CM

## 2014-12-20 NOTE — Progress Notes (Addendum)
Patient ID: Joseph Ponce, male   DOB: [birth year], 81 y.o.   MRN: [id number]                PROGRESS NOTE  DATE:  12/17/2014               [facility]                              LEVEL OF CARE:   SNF   Acute Visit                                        CHIEF COMPLAINT:  Hypertension.      HISTORY OF PRESENT ILLNESS:  This is a patient who has been in the building since 2005.    He is felt to have vascular dementia, failure to thrive.    He has a history of a left BKA secondary to PAD.    He carries a chronic hypernatremia diagnosis, on DDAVP, although the exact work-up for this, I think, is lost to antiquity.    He does have a history of hypertension with renal artery stenosis.  He also has chronic renal insufficiency with his last BUN and creatinine at 37 and 1.63, respectively, on 10/07/2014.     Apparently, his blood pressure has not been under very good control, mostly in the 160 range systolic.  His antihypertensive regimen currently includes Norvasc 10 q.d., hydralazine 25 mg three times a day.    I have also thought previously that he has sick sinus syndrome with atrial fibrillation, with a slow heart rate.  This has dipped into the 40s in the past, although today I found this to be 60.    PHYSICAL EXAMINATION:   VITAL SIGNS:   PULSE:   60, and feels fairly regular.   BLOOD PRESSURE:   125/70 in the right arm supine.    GENERAL APPEARANCE:   The patient is not in any distress.   CHEST/RESPIRATORY:  Clear air entry bilaterally.    CARDIOVASCULAR:   CARDIAC:  Heart sounds are normal.  There are no murmurs.     ASSESSMENT/PLAN:                                   Hypertension.  I find his blood pressure to be under fairly good control.  He is listed as having renal artery stenosis, although apparently this cannot be easily reproduced in the current [facility].  As I understand the approach to this, blood pressure control is important and monitoring his BUN  and creatinine is important.  I would not investigate this further unless his blood pressure is poorly controlled on three different medications at least, which I do not think is the case, and/or his creatinine starts to increase.  Currently, his blood pressure today seems fairly stable.

## 2014-12-24 ENCOUNTER — Non-Acute Institutional Stay (SKILLED_NURSING_FACILITY): Payer: Medicare Other | Admitting: Internal Medicine

## 2014-12-24 DIAGNOSIS — B370 Candidal stomatitis: Secondary | ICD-10-CM

## 2014-12-24 DIAGNOSIS — M25561 Pain in right knee: Secondary | ICD-10-CM | POA: Diagnosis not present

## 2014-12-29 NOTE — Progress Notes (Addendum)
Patient ID: Jacolyn ReedyJohn Chenette, male   DOB: [birth year], 81 y.o.   MRN: [id number]               PROGRESS NOTE  DATE:  12/24/2014            FACILITY: [facility]                         LEVEL OF CARE:   SNF   Acute Visit   CHIEF COMPLAINT:  Review of oral lesions, right leg pain.    HISTORY OF PRESENT ILLNESS:  This is a patient with vascular dementia who has been in the facility since 2005.    Apparently, the staff has noted white lesions in his mouth and I was asked to look at this.        He has also been complaining of right leg pain.    PHYSICAL EXAMINATION:   HEENT:   MOUTH/THROAT:  On oral exam, there is indeed what looks to be thrush on his tongue.   MUSCULOSKELETAL:   EXTREMITIES:   BILATERAL LOWER EXTREMITIES:  He has had a right total knee replacement.  I think his knee is actually where most of the problem is.  There is no evidence of a significant vascular problem, nor do I think there are hip problems here.    ASSESSMENT/PLAN:                           Oral thrush.  I will order Nystatin swish and swallow.    Right knee pain.  I think this is the total knee replacement.  I can consider giving him routine analgesics.  Orthopedics if this becomes a continued concern

## 2014-12-31 ENCOUNTER — Non-Acute Institutional Stay (SKILLED_NURSING_FACILITY): Payer: Medicare Other | Admitting: Internal Medicine

## 2014-12-31 DIAGNOSIS — N183 Chronic kidney disease, stage 3 (moderate): Secondary | ICD-10-CM | POA: Diagnosis not present

## 2014-12-31 DIAGNOSIS — I701 Atherosclerosis of renal artery: Secondary | ICD-10-CM

## 2015-01-03 NOTE — Progress Notes (Addendum)
Patient ID: Joseph ReedyJohn Ponce, male   DOB: [birth year], 81 y.o.   MRN: [id number]               PROGRESS NOTE  DATE:  12/31/2014               [facility]                           LEVEL OF CARE:   SNF   Acute Visit   CHIEF COMPLAINT:  Hypertension, renal insufficiency.    HISTORY OF PRESENT ILLNESS:  I saw this man two weeks ago.    He has a history of hypertension with renal artery stenosis, although apparently the documentation of the renal artery stenosis is not available in [facility].  He does have chronic renal failure, however, with a BUN and creatinine of 37 and 1.63, respectively, on 10/07/2014; repeated yesterday at 50 and 2.15, respectively.      He also has chronic hypernatremia, on DDAVP, presumably central diabetes insipidus although the exact work-up for this, I think, is lost to antiquity.    I checked his blood pressure last week.  It was 125/70 in the right arm.  Currently on Norvasc 10 mg, hydralazine 25  t.i.d.                  Finally, I also felt he had sick sinus syndrome in the past with atrial fibrillation with a slow ventricular response, although he has been very stable and asymptomatic.       PHYSICAL EXAMINATION:                 VITAL SIGNS:   PULSE:  52 and regular.    O2 SATURATIONS:  Pulse ox 97% on room air.     BLOOD PRESSURE:  [facility]  Most of these are in the 150s, some of them approaching 187 systolic.  His diastolic blood pressures are in the 80s.   CARDIOVASCULAR:  CARDIAC:   Heart sounds are normal.  There are no murmurs.    CHEST/RESPIRATORY:  Clear air entry bilaterally.   MUSCULOSKELETAL:   EXTREMITIES:   BILATERAL LOWER EXTREMITIES:  His one remaining extremity is dry and cracked.  He is complaining of pain here which may be PAD, although I cannot really be sure of this.    ASSESSMENT/PLAN:                                     Poorly controlled hypertension which is apparently  renal artery stenosis although, as previously documented, I do not see anything really here to verify this.  He did have an echocardiogram in May 2015, which was a stress echocardiogram which was normal.  His systolic function was normal.  His current medications include Norvasc 10 mg q.d., hydralazine 50 mg every 8 hours.   I think the hydralazine can be increased here.  Most of the blood pressures that the nurses have recorded are elevated.    Pain in the right leg.  This may be resting PAD.  I will try to have another discussion with them.  I was not really sure about this.  He does not appear to have any active joints.  No other cause of this was seen.      CPT CODE: [id number]

## 2015-01-20 ENCOUNTER — Non-Acute Institutional Stay (SKILLED_NURSING_FACILITY): Payer: Medicare Other | Admitting: Nurse Practitioner

## 2015-01-20 DIAGNOSIS — K5900 Constipation, unspecified: Secondary | ICD-10-CM | POA: Diagnosis not present

## 2015-01-20 DIAGNOSIS — I10 Essential (primary) hypertension: Secondary | ICD-10-CM | POA: Diagnosis not present

## 2015-01-20 DIAGNOSIS — D649 Anemia, unspecified: Secondary | ICD-10-CM | POA: Diagnosis not present

## 2015-01-20 DIAGNOSIS — R001 Bradycardia, unspecified: Secondary | ICD-10-CM

## 2015-01-20 DIAGNOSIS — I2510 Atherosclerotic heart disease of native coronary artery without angina pectoris: Secondary | ICD-10-CM

## 2015-01-20 NOTE — Progress Notes (Signed)
Patient ID: Joseph Ponce, male   DOB: 10-15-1924, 81 y.o.   MRN: [id number]    Nursing Home Location:  [place] of Service: SNF (31)  PCP: Cyndee Brightly, MD  No Known Allergies  Chief Complaint  Patient presents with  . Medical Management of Chronic Issues    HPI:  81 y.o.  male who is a LTR of [place] with a pmh of dementia, OP, FTT, CVA, anemia, hyperlipidemia, PVD and htn. Pt is being seen today for routine follow up on chronic conditions. Pt has been seen frequently in the last month by Dr Dellia Nims due to hypertension and leg pain. Hydralazine increased to 50 mg qhs. Pt without any complaints of leg or joint pain today. Nursing without concerns.   Review of Systems:  Review of Systems  Constitutional: Negative for fever, chills and weight loss.  HENT: Negative for tinnitus.   Respiratory: Negative for cough, sputum production and shortness of breath.   Cardiovascular: Negative for chest pain, palpitations and leg swelling.  Gastrointestinal: Negative for heartburn, abdominal pain, diarrhea and constipation.  Genitourinary: Negative for dysuria, urgency and frequency.  Musculoskeletal: Negative for myalgias, back pain, joint pain and falls.  Skin: Negative.   Neurological: Negative for dizziness and headaches.  Psychiatric/Behavioral: Positive for memory loss. Negative for depression. The patient does not have insomnia.      Past Medical History  Diagnosis Date  . Dementia   . Muscle weakness   . Osteoporosis   . Alzheimer disease   . Failure to thrive in adult   . Dementia   . CVA (cerebral vascular accident)     speech and language d/o deficits  . Weakness   . Hyperlipemia   . Dysphagia   . Anemia   . PVD (peripheral vascular disease)   . COPD (chronic obstructive pulmonary disease)   . Glaucoma (increased eye pressure)   . Hypertension   . S/P BKA (below knee amputation) unilateral 01/21/2006    left  . Renal artery stenosis     Past Surgical History  Procedure Laterality Date  . Leg amputation below knee      left   Social History:   reports that he has quit smoking. He does not have any smokeless tobacco history on file. He reports that he does not drink alcohol or use illicit drugs.  No family history on file.  Medications: Patient's Medications  New Prescriptions   No medications on file  Previous Medications   AMINO ACIDS-PROTEIN HYDROLYS (FEEDING SUPPLEMENT, PRO-STAT SUGAR FREE 64,) LIQD    Take 30 mLs by mouth 2 (two) times daily.   AMLODIPINE (NORVASC) 10 MG TABLET    Take 10 mg by mouth daily.   ASPIRIN 81 MG CHEWABLE TABLET    Chew 81 mg by mouth daily.   BRIMONIDINE (ALPHAGAN P) 0.1 % SOLN    Place 1 drop into both eyes 2 (two) times daily.   CALCIUM-VITAMIN D (OSCAL WITH D) 500-200 MG-UNIT PER TABLET    Take 1 tablet by mouth 2 (two) times daily.   CLOPIDOGREL (PLAVIX) 75 MG TABLET    Take 75 mg by mouth daily.   DENOSUMAB (PROLIA) 60 MG/ML SOLN INJECTION    Inject 60 mg into the skin every 6 (six) months.    DESMOPRESSIN (DDAVP) 0.01 % NASAL SOLUTION    Place 10 mcg into the nose 3 (three) times daily.   DORZOLAMIDE (TRUSOPT) 2 % OPHTHALMIC SOLUTION    Place  1 drop into both eyes 2 (two) times daily.   FEEDING SUPPLEMENT, RESOURCE BREEZE, (RESOURCE BREEZE) LIQD    Take 1 Container by mouth 2 (two) times daily between meals.   GABAPENTIN (NEURONTIN) 100 MG CAPSULE    Take 100 mg by mouth 2 (two) times daily.   HYDRALAZINE (APRESOLINE) 50 MG TABLET    Take 50 mg by mouth 3 (three) times daily.   HYDROCODONE-ACETAMINOPHEN (NORCO/VICODIN) 5-325 MG PER TABLET    Take one tablet by mouth twice daily for chronic pain. Do not exceed 4gm of Tylenol in 24 hours.   HYDROXYPROPYL METHYLCELLULOSE (ISOPTO TEARS) 2.5 % OPHTHALMIC SOLUTION    Place 1 drop into both eyes 3 (three) times daily.   MAGNESIUM HYDROXIDE (MILK OF MAGNESIA) 400 MG/5ML SUSPENSION    Take 30 mLs by mouth daily as needed. For  constipation   MEMANTINE HCL ER (NAMENDA XR) 28 MG CP24    Take 28 mg by mouth daily.   MULTIPLE VITAMINS-MINERALS (CERTA-VITE PO)    Take 1 tablet by mouth daily.   NITROGLYCERIN (NITRODUR - DOSED IN MG/24 HR) 0.2 MG/HR    Place 1 patch onto the skin daily.   POLYETHYLENE GLYCOL (MIRALAX / GLYCOLAX) PACKET    Take 17 g by mouth daily.   TRAVOPROST, BAK FREE, (TRAVATAN) 0.004 % SOLN OPHTHALMIC SOLUTION    Place 1 drop into both eyes at bedtime.  Modified Medications   No medications on file  Discontinued Medications   HYDRALAZINE (APRESOLINE) 10 MG TABLET    Take 25 mg by mouth 3 (three) times daily.      Physical Exam:  Filed Vitals:   01/20/15 1121  BP: 150/65  Pulse: 60  Temp: 98.1 F (36.7 C)  Resp: 20    Physical Exam  Constitutional: He is well-developed, well-nourished, and in no distress.  HENT:  Mouth/Throat: Oropharynx is clear and moist. No oropharyngeal exudate.  Eyes: Conjunctivae and EOM are normal. Pupils are equal, round, and reactive to light.  Neck: Normal range of motion. Neck supple.  Cardiovascular: Normal rate, regular rhythm and normal heart sounds.   Pulmonary/Chest: Effort normal and breath sounds normal.  Abdominal: Soft. Bowel sounds are normal. He exhibits no distension.  Musculoskeletal: He exhibits no edema or tenderness.  Left BKA; self propels in [facility]  Neurological: He is alert.  Skin: Skin is warm and dry. He is not diaphoretic.  Psychiatric: Affect normal.  Vitals reviewed.    Labs reviewed: Basic Metabolic Panel:  Recent Labs  05/02/14 0530 05/03/14 0705 05/04/14 0443  NA 153* 150* 145  K 4.5 3.7 3.7  CL 117* 112 107  CO2 _0 GLUCOSE 115* 137* 136*  BUN 49* 44* 35*  CREATININE 2.40* 2.03* 1.77*  CALCIUM 9.8 9.4 8.7   Liver Function Tests:  Recent Labs  01/30/14 2324 05/01/14 1713 05/02/14 0530  AST 17 50* 34  ALT 13 39 35  ALKPHOS 80 80 90  BILITOT <0.2* <0.2* 0.8  PROT 6.5 6.3 7.3  ALBUMIN 3.0* 2.3* 2.4*    No results for input(s): LIPASE, AMYLASE in the last 8760 hours. No results for input(s): AMMONIA in the last 8760 hours. CBC:  Recent Labs  01/30/14 2324 01/30/14 2329 05/01/14 1713 05/02/14 0530  WBC 6.3  --  15.0* 15.2*  NEUTROABS 3.4  --  11.2* 11.6*  HGB 10.6* 11.6* 6.4* 11.8*  HCT 32.9* 34.0* 20.3* 37.2*  MCV 87.0  --  86.0 85.9  PLT 198  --  247 212   CBC with Diff  Result: 02/16/2014 5:47 PM ( Status: F )  WBC 5.2 4.0-10.5 K/uL SLN  RBC 3.94 L 4.22-5.81 MIL/uL SLN  Hemoglobin 10.6 L 13.0-17.0 g/dL SLN  Hematocrit 33.1 L 39.0-52.0 % SLN  MCV 84.0 78.0-100.0 fL SLN  MCH 26.9 26.0-34.0 pg SLN  MCHC 32.0 30.0-36.0 g/dL SLN  RDW 16.0 H 11.5-15.5 % SLN  Platelet Count 192 150-400 K/uL SLN  Granulocyte % 69 43-77 % SLN  Absolute Gran 3.6 1.7-7.7 K/uL SLN  Lymph % 20 12-46 % SLN  Absolute Lymph 1.0 0.7-4.0 K/uL SLN  Mono % 7 3-12 % SLN  Absolute Mono 0.4 0.1-1.0 K/uL SLN  Eos % 3 0-5 % SLN  Absolute Eos 0.2 0.0-0.7 K/uL SLN  Baso % 1 0-1 % SLN  Absolute Baso 0.1 0.0-0.1 K/uL SLN  Smear Review Criteria for review not met SLN  Basic Metabolic Panel  Result: 7/41/2878 7:15 PM ( Status: F )  Sodium 150 H 135-145 mEq/L SLN  Potassium 4.1 3.5-5.3 mEq/L SLN  Chloride 114 H 96-112 mEq/L SLN  CO2 28 19-32 mEq/L SLN  Glucose 147 H 70-99 mg/dL SLN  BUN 41 H 6-23 mg/dL SLN  Creatinine 1.55 H 0.50-1.35 mg/dL SLN  Calcium 9.6  CBC NO Diff (Complete Blood Count)    Result: 06/21/2014 10:16 AM   ( Status: F )     C WBC 5.1     4.0-10.5 K/uL SLN   RBC 3.74   L 4.22-5.81 MIL/uL SLN   Hemoglobin 9.7   L 13.0-17.0 g/dL SLN   Hematocrit 31.1   L 39.0-52.0 % SLN   MCV 83.2     78.0-100.0 fL SLN   MCH 25.9   L 26.0-34.0 pg SLN   MCHC 31.2     30.0-36.0 g/dL SLN   RDW 16.3   H 11.5-15.5 % SLN   Platelet Count 194     150-400 K/uL SLN   Comprehensive Metabolic Panel    Result: 06/21/2014 11:17 AM   ( Status: F )       Sodium 145     135-145 mEq/L SLN   Potassium 4.4      3.5-5.3 mEq/L SLN   Chloride 112     96-112 mEq/L SLN   CO2 31     19-32 mEq/L SLN   Glucose 82     70-99 mg/dL SLN   BUN 39   H 6-23 mg/dL SLN   Creatinine 1.73   H 0.50-1.35 mg/dL SLN   Bilirubin, Total 0.3     0.2-1.2 mg/dL SLN   Alkaline Phosphatase 54     39-117 U/L SLN   AST/SGOT 12     0-37 U/L SLN   ALT/SGPT <8     0-53 U/L SLN   Total Protein 5.5   L 6.0-8.3 g/dL SLN   Albumin 2.9   L 3.5-5.2 g/dL SLN   Calcium 9.0    CBC with Diff    Result: 10/07/2014 11:29 AM   ( Status: F )     C WBC 5.4     4.0-10.5 K/uL SLN   RBC 3.90   L 4.22-5.81 MIL/uL SLN   Hemoglobin 10.2   L 13.0-17.0 g/dL SLN   Hematocrit 31.9   L 39.0-52.0 % SLN   MCV 81.8     78.0-100.0 fL SLN   MCH 26.2     26.0-34.0 pg SLN   MCHC 32.0     30.0-36.0 g/dL SLN  RDW 15.1     11.5-15.5 % SLN   Platelet Count 179     150-400 K/uL SLN   Granulocyte % 50     43-77 % SLN   Absolute Gran 2.7     1.7-7.7 K/uL SLN   Lymph % 33     12-46 % SLN   Absolute Lymph 1.8     0.7-4.0 K/uL SLN   Mono % 11     3-12 % SLN   Absolute Mono 0.6     0.1-1.0 K/uL SLN   Eos % 5     0-5 % SLN   Absolute Eos 0.3     0.0-0.7 K/uL SLN   Baso % 1     0-1 % SLN   Absolute Baso 0.1     0.0-0.1 K/uL SLN   Smear Review Criteria for review not met  SLN   MPV 10.1     9.4-12.4 fL SLN   Comprehensive Metabolic Panel    Result: 10/07/2014 12:24 PM   ( Status: F )       Sodium 147   H 135-145 mEq/L SLN   Potassium 4.5     3.5-5.3 mEq/L SLN   Chloride 117   H 96-112 mEq/L SLN   CO2 24     19-32 mEq/L SLN   Glucose 88     70-99 mg/dL SLN   BUN 37   H 6-23 mg/dL SLN   Creatinine 1.63   H 0.50-1.35 mg/dL SLN   Bilirubin, Total 0.3     0.2-1.2 mg/dL SLN   Alkaline Phosphatase 53     39-117 U/L SLN   AST/SGOT 12     0-37 U/L SLN   ALT/SGPT <8     0-53 U/L SLN   Total Protein 5.5   L 6.0-8.3 g/dL SLN   Albumin 3.2   L 3.5-5.2 g/dL SLN   Calcium 8.9      Assessment/Plan  1. Bradycardia, sinus -pt with hx of sick sinus syndrome with  atrial fibrillation, with a slow heart rate into the 40s in the past, current stable in the 60s.  2. Essential hypertension -conts on norvasc 10 mg daily and recent increase to hydralazine 50 mg q 8 hours. Blood pressure occasionally still elevated with sbp over 170 but overall have improved. Will cont current regimen Follow up BMP scheduled  3. Anemia, unspecified anemia type will follow up cbc and iron studies.   4. Coronary artery disease involving native coronary artery of native heart without angina pectoris -without chest pains, conts on plavix and ASA   5. Constipation, unspecified constipation type -pt reports he tends to be constipation, but staff confirms this is controlled on medications

## 2015-02-03 ENCOUNTER — Other Ambulatory Visit: Payer: Self-pay

## 2015-02-03 MED ORDER — HYDROCODONE-ACETAMINOPHEN 5-325 MG PO TABS: ORAL_TABLET | ORAL | Status: DC

## 2015-02-03 NOTE — Telephone Encounter (Signed)
[facility] 

## 2015-02-24 ENCOUNTER — Non-Acute Institutional Stay (SKILLED_NURSING_FACILITY): Payer: Medicare Other | Admitting: Nurse Practitioner

## 2015-02-24 DIAGNOSIS — D649 Anemia, unspecified: Secondary | ICD-10-CM

## 2015-02-24 DIAGNOSIS — R001 Bradycardia, unspecified: Secondary | ICD-10-CM

## 2015-02-24 DIAGNOSIS — F0390 Unspecified dementia without behavioral disturbance: Secondary | ICD-10-CM

## 2015-02-24 DIAGNOSIS — G894 Chronic pain syndrome: Secondary | ICD-10-CM

## 2015-02-24 DIAGNOSIS — F32A Depression, unspecified: Secondary | ICD-10-CM

## 2015-02-24 DIAGNOSIS — F329 Major depressive disorder, single episode, unspecified: Secondary | ICD-10-CM

## 2015-02-24 DIAGNOSIS — I10 Essential (primary) hypertension: Secondary | ICD-10-CM | POA: Diagnosis not present

## 2015-02-24 NOTE — Progress Notes (Signed)
Patient ID: Joseph Ponce, male   DOB: 1923-11-11, 81 y.o.   MRN: [id number]    Nursing Home Location:  [facility]   Place of Service:    PCP: Cyndee Brightly, MD  No Known Allergies  Chief Complaint  Patient presents with  . Medical Management of Chronic Issues    HPI:  81 y.o.  male who is a LTR of [place] with a pmh of dementia, OP, FTT, CVA, anemia, hyperlipidemia, PVD and htn. Pt is being seen today for routine follow up on chronic conditions. Pt has been seen frequently in the last month by Dr Dellia Nims due to hypertension and leg pain. Hydralazine increased to 50 mg qhs. Blood pressure with better control at this time. Pt has been doing well in the last month. Getting OOB. No changes in functional or cognitive status noted. Pt without any complaints of leg or joint pain today. Nursing without concerns.   Review of Systems:  Review of Systems  Constitutional: Negative for weight loss and malaise/fatigue.  HENT: Negative for tinnitus.   Respiratory: Negative for cough and shortness of breath.   Cardiovascular: Negative for chest pain, palpitations and leg swelling.  Gastrointestinal: Negative for heartburn, diarrhea and constipation.  Genitourinary: Negative for dysuria.  Musculoskeletal: Negative for myalgias and joint pain.  Skin: Negative.   Neurological: Negative for dizziness, sensory change, weakness and headaches.  Psychiatric/Behavioral: Positive for memory loss. Negative for depression. The patient does not have insomnia.      Past Medical History  Diagnosis Date  . Dementia   . Muscle weakness   . Osteoporosis   . Alzheimer disease   . Failure to thrive in adult   . Dementia   . CVA (cerebral vascular accident)     speech and language d/o deficits  . Weakness   . Hyperlipemia   . Dysphagia   . Anemia   . PVD (peripheral vascular disease)   . COPD (chronic obstructive pulmonary disease)   . Glaucoma (increased eye pressure)   .  Hypertension   . S/P BKA (below knee amputation) unilateral 01/21/2006    left  . Renal artery stenosis    Past Surgical History  Procedure Laterality Date  . Leg amputation below knee      left   Social History:   reports that he has quit smoking. He does not have any smokeless tobacco history on file. He reports that he does not drink alcohol or use illicit drugs.  No family history on file.  Medications: Patient's Medications  New Prescriptions   No medications on file  Previous Medications   AMINO ACIDS-PROTEIN HYDROLYS (FEEDING SUPPLEMENT, PRO-STAT SUGAR FREE 64,) LIQD    Take 30 mLs by mouth 2 (two) times daily.   AMLODIPINE (NORVASC) 10 MG TABLET    Take 10 mg by mouth daily.   ASPIRIN 81 MG CHEWABLE TABLET    Chew 81 mg by mouth daily.   BRIMONIDINE (ALPHAGAN P) 0.1 % SOLN    Place 1 drop into both eyes 2 (two) times daily.   CALCIUM-VITAMIN D (OSCAL WITH D) 500-200 MG-UNIT PER TABLET    Take 1 tablet by mouth 2 (two) times daily.   CLOPIDOGREL (PLAVIX) 75 MG TABLET    Take 75 mg by mouth daily.   DENOSUMAB (PROLIA) 60 MG/ML SOLN INJECTION    Inject 60 mg into the skin every 6 (six) months.    DESMOPRESSIN (DDAVP) 0.01 % NASAL SOLUTION    Place 10 mcg  into the nose 3 (three) times daily.   DORZOLAMIDE (TRUSOPT) 2 % OPHTHALMIC SOLUTION    Place 1 drop into both eyes 2 (two) times daily.   FEEDING SUPPLEMENT, RESOURCE BREEZE, (RESOURCE BREEZE) LIQD    Take 1 Container by mouth 2 (two) times daily between meals.   GABAPENTIN (NEURONTIN) 100 MG CAPSULE    Take 100 mg by mouth 2 (two) times daily.   HYDRALAZINE (APRESOLINE) 50 MG TABLET    Take 50 mg by mouth 3 (three) times daily.   HYDROCODONE-ACETAMINOPHEN (NORCO/VICODIN) 5-325 MG PER TABLET    Take one tablet by mouth twice daily for chronic pain. Do not exceed 4gm of Tylenol in 24 hours. Take one tablet by mouth every 6 hours as needed for pain   HYDROXYPROPYL METHYLCELLULOSE (ISOPTO TEARS) 2.5 % OPHTHALMIC SOLUTION    Place  1 drop into both eyes 3 (three) times daily.   MAGNESIUM HYDROXIDE (MILK OF MAGNESIA) 400 MG/5ML SUSPENSION    Take 30 mLs by mouth daily as needed. For constipation   MEMANTINE (NAMENDA XR) 28 MG CP24 24 HR CAPSULE    Take 28 mg by mouth daily.   MULTIPLE VITAMINS-MINERALS (CERTA-VITE PO)    Take 1 tablet by mouth daily.   NITROGLYCERIN (NITRODUR - DOSED IN MG/24 HR) 0.2 MG/HR    Place 1 patch onto the skin daily.   POLYETHYLENE GLYCOL (MIRALAX / GLYCOLAX) PACKET    Take 17 g by mouth daily.   TRAVOPROST, BAK FREE, (TRAVATAN) 0.004 % SOLN OPHTHALMIC SOLUTION    Place 1 drop into both eyes at bedtime.  Modified Medications   No medications on file  Discontinued Medications   MEMANTINE HCL ER (NAMENDA XR) 28 MG CP24    Take 28 mg by mouth daily.     Physical Exam:  Filed Vitals:   02/24/15 1100  BP: 141/71  Pulse: 58  Temp: 97.3 F (36.3 C)  Resp: 20  Weight: 167 lb (75.751 kg)    Physical Exam  Constitutional: He is well-developed, well-nourished, and in no distress.  HENT:  Mouth/Throat: Oropharynx is clear and moist. No oropharyngeal exudate.  Eyes: Conjunctivae and EOM are normal. Pupils are equal, round, and reactive to light.  Neck: Normal range of motion. Neck supple.  Cardiovascular: Normal rate, regular rhythm and normal heart sounds.   Pulmonary/Chest: Effort normal and breath sounds normal.  Abdominal: Soft. Bowel sounds are normal. He exhibits no distension.  Musculoskeletal: He exhibits no edema or tenderness.  Left BKA; self propels in [facility]  Neurological: He is alert.  Skin: Skin is warm and dry. He is not diaphoretic.  Psychiatric: Affect normal.  Vitals reviewed.    Labs reviewed: Basic Metabolic Panel:  Recent Labs  05/02/14 0530 05/03/14 0705 05/04/14 0443  NA 153* 150* 145  K 4.5 3.7 3.7  CL 117* 112 107  CO2 '19 25 25  ' GLUCOSE 115* 137* 136*  BUN 49* 44* 35*  CREATININE 2.40* 2.03* 1.77*  CALCIUM 9.8 9.4 8.7   Liver Function  Tests:  Recent Labs  05/01/14 1713 05/02/14 0530  AST 50* 34  ALT 39 35  ALKPHOS 80 90  BILITOT <0.2* 0.8  PROT 6.3 7.3  ALBUMIN 2.3* 2.4*   No results for input(s): LIPASE, AMYLASE in the last 8760 hours. No results for input(s): AMMONIA in the last 8760 hours. CBC:  Recent Labs  05/01/14 1713 05/02/14 0530  WBC 15.0* 15.2*  NEUTROABS 11.2* 11.6*  HGB 6.4* 11.8*  HCT 20.3* 37.2*  MCV 86.0 85.9  PLT 247 212   CBC with Diff  Result: 02/16/2014 5:47 PM ( Status: F )  WBC 5.2 4.0-10.5 K/uL SLN  RBC 3.94 L 4.22-5.81 MIL/uL SLN  Hemoglobin 10.6 L 13.0-17.0 g/dL SLN  Hematocrit 33.1 L 39.0-52.0 % SLN  MCV 84.0 78.0-100.0 fL SLN  MCH 26.9 26.0-34.0 pg SLN  MCHC 32.0 30.0-36.0 g/dL SLN  RDW 16.0 H 11.5-15.5 % SLN  Platelet Count 192 150-400 K/uL SLN  Granulocyte % 69 43-77 % SLN  Absolute Gran 3.6 1.7-7.7 K/uL SLN  Lymph % 20 12-46 % SLN  Absolute Lymph 1.0 0.7-4.0 K/uL SLN  Mono % 7 3-12 % SLN  Absolute Mono 0.4 0.1-1.0 K/uL SLN  Eos % 3 0-5 % SLN  Absolute Eos 0.2 0.0-0.7 K/uL SLN  Baso % 1 0-1 % SLN  Absolute Baso 0.1 0.0-0.1 K/uL SLN  Smear Review Criteria for review not met SLN  Basic Metabolic Panel  Result: 2/87/8676 7:15 PM ( Status: F )  Sodium 150 H 135-145 mEq/L SLN  Potassium 4.1 3.5-5.3 mEq/L SLN  Chloride 114 H 96-112 mEq/L SLN  CO2 28 19-32 mEq/L SLN  Glucose 147 H 70-99 mg/dL SLN  BUN 41 H 6-23 mg/dL SLN  Creatinine 1.55 H 0.50-1.35 mg/dL SLN  Calcium 9.6  CBC NO Diff (Complete Blood Count)    Result: 06/21/2014 10:16 AM   ( Status: F )     C WBC 5.1     4.0-10.5 K/uL SLN   RBC 3.74   L 4.22-5.81 MIL/uL SLN   Hemoglobin 9.7   L 13.0-17.0 g/dL SLN   Hematocrit 31.1   L 39.0-52.0 % SLN   MCV 83.2     78.0-100.0 fL SLN   MCH 25.9   L 26.0-34.0 pg SLN   MCHC 31.2     30.0-36.0 g/dL SLN   RDW 16.3   H 11.5-15.5 % SLN   Platelet Count 194     150-400 K/uL SLN   Comprehensive Metabolic Panel    Result: 06/21/2014 11:17 AM   ( Status: F )        Sodium 145     135-145 mEq/L SLN   Potassium 4.4     3.5-5.3 mEq/L SLN   Chloride 112     96-112 mEq/L SLN   CO2 31     19-32 mEq/L SLN   Glucose 82     70-99 mg/dL SLN   BUN 39   H 6-23 mg/dL SLN   Creatinine 1.73   H 0.50-1.35 mg/dL SLN   Bilirubin, Total 0.3     0.2-1.2 mg/dL SLN   Alkaline Phosphatase 54     39-117 U/L SLN   AST/SGOT 12     0-37 U/L SLN   ALT/SGPT <8     0-53 U/L SLN   Total Protein 5.5   L 6.0-8.3 g/dL SLN   Albumin 2.9   L 3.5-5.2 g/dL SLN   Calcium 9.0    CBC with Diff    Result: 10/07/2014 11:29 AM   ( Status: F )     C WBC 5.4     4.0-10.5 K/uL SLN   RBC 3.90   L 4.22-5.81 MIL/uL SLN   Hemoglobin 10.2   L 13.0-17.0 g/dL SLN   Hematocrit 31.9   L 39.0-52.0 % SLN   MCV 81.8     78.0-100.0 fL SLN   MCH 26.2     26.0-34.0 pg SLN   MCHC 32.0  30.0-36.0 g/dL SLN   RDW 15.1     11.5-15.5 % SLN   Platelet Count 179     150-400 K/uL SLN   Granulocyte % 50     43-77 % SLN   Absolute Gran 2.7     1.7-7.7 K/uL SLN   Lymph % 33     12-46 % SLN   Absolute Lymph 1.8     0.7-4.0 K/uL SLN   Mono % 11     3-12 % SLN   Absolute Mono 0.6     0.1-1.0 K/uL SLN   Eos % 5     0-5 % SLN   Absolute Eos 0.3     0.0-0.7 K/uL SLN   Baso % 1     0-1 % SLN   Absolute Baso 0.1     0.0-0.1 K/uL SLN   Smear Review Criteria for review not met  SLN   MPV 10.1     9.4-12.4 fL SLN   Comprehensive Metabolic Panel    Result: 10/07/2014 12:24 PM   ( Status: F )       Sodium 147   H 135-145 mEq/L SLN   Potassium 4.5     3.5-5.3 mEq/L SLN   Chloride 117   H 96-112 mEq/L SLN   CO2 24     19-32 mEq/L SLN   Glucose 88     70-99 mg/dL SLN   BUN 37   H 6-23 mg/dL SLN   Creatinine 1.63   H 0.50-1.35 mg/dL SLN   Bilirubin, Total 0.3     0.2-1.2 mg/dL SLN   Alkaline Phosphatase 53     39-117 U/L SLN   AST/SGOT 12     0-37 U/L SLN   ALT/SGPT <8     0-53 U/L SLN   Total Protein 5.5   L 6.0-8.3 g/dL SLN   Albumin 3.2   L 3.5-5.2 g/dL SLN   Calcium 8.9     Iron and IBC    Result:  01/29/2015 11:37 AM   ( Status: F )     C Iron 63     42-165 ug/dL SLN   UIBC 103   L 125-400 ug/dL SLN   TIBC 166   L 215-435 ug/dL SLN   %SAT 38     20-55 % SLN   CBC NO Diff (Complete Blood Count)    Result: 01/29/2015 10:17 AM   ( Status: F )       WBC 4.6     4.0-10.5 K/uL SLN   RBC 3.60   L 4.22-5.81 MIL/uL SLN   Hemoglobin 9.4   L 13.0-17.0 g/dL SLN   Hematocrit 29.8   L 39.0-52.0 % SLN   MCV 82.8     78.0-100.0 fL [facility]N   MCH 26.1     26.0-34.0 pg SLN   MCHC 31.5     30.0-36.0 g/dL SLN   RDW 15.9   H 11.5-15.5 % SLN   Platelet Count 228     150-400 K/uL SLN   MPV 9.1     8.6-12.4 fL SLN   Basic Metabolic Panel    Result: 01/29/2015 11:37 AM   ( Status: F )       Sodium 144     135-145 mEq/L SLN   Potassium 4.2     3.5-5.3 mEq/L SLN   Chloride 111     96-112 mEq/L SLN   CO2 25     19-32 mEq/L SLN  Glucose 80     70-99 mg/dL SLN   BUN 39   H 6-23 mg/dL SLN   Creatinine 1.82   H 0.50-1.35 mg/dL SLN   Calcium 9.6   Assessment/Plan  1. Essential hypertension - better blood pressure control with increase in hydralazine. Will cont to monitor  2. Bradycardia, sinus -stable at this time  3. Dementia without behavioral disturbance -unchanged conts on namenda   4. Anemia, unspecified anemia type -hgb remains stable  5. Chronic pain syndrome -denies any pain during visit, appears adequately controlled on current reigmen  6. Depression Followed by psych, Remeron added with good effects.

## 2015-03-24 ENCOUNTER — Non-Acute Institutional Stay (SKILLED_NURSING_FACILITY): Payer: Medicare Other | Admitting: Nurse Practitioner

## 2015-03-24 DIAGNOSIS — G4700 Insomnia, unspecified: Secondary | ICD-10-CM | POA: Diagnosis not present

## 2015-03-24 DIAGNOSIS — F32A Depression, unspecified: Secondary | ICD-10-CM

## 2015-03-24 DIAGNOSIS — G894 Chronic pain syndrome: Secondary | ICD-10-CM

## 2015-03-24 DIAGNOSIS — K5900 Constipation, unspecified: Secondary | ICD-10-CM | POA: Diagnosis not present

## 2015-03-24 DIAGNOSIS — R001 Bradycardia, unspecified: Secondary | ICD-10-CM | POA: Diagnosis not present

## 2015-03-24 DIAGNOSIS — F329 Major depressive disorder, single episode, unspecified: Secondary | ICD-10-CM

## 2015-03-24 DIAGNOSIS — I10 Essential (primary) hypertension: Secondary | ICD-10-CM | POA: Diagnosis not present

## 2015-03-24 NOTE — Progress Notes (Signed)
Patient ID: Joseph Ponce, male   DOB: Apr 02, 1924, 81 y.o.   MRN: [id number]    Nursing Home Location:  [place] of Service: SNF (31)  PCP: Cyndee Brightly, MD  No Known Allergies  Chief Complaint  Patient presents with  . Medical Management of Chronic Issues    HPI:  81 y.o.  male who is a LTR of [place] with a pmh of dementia, OP, FTT, CVA, anemia, hyperlipidemia, PVD and htn. Pt is being seen today for routine follow up on chronic. Pt has done well in the last month, no acute issues noted by nursing. Pt without complaints today and staff without concerns. Blood pressure have been stable on review. HR remains stable in the 50s at times but does not get lower than that. Pt reports good appetite.  Review of Systems:  Review of Systems  Constitutional: Negative for fever, chills, weight loss and malaise/fatigue.  HENT: Negative for tinnitus.   Respiratory: Negative for cough, sputum production and shortness of breath.   Cardiovascular: Negative for chest pain, palpitations and leg swelling.  Gastrointestinal: Negative for heartburn, abdominal pain, diarrhea and constipation.  Genitourinary: Negative for dysuria, urgency and frequency.  Musculoskeletal: Negative for myalgias, back pain and joint pain.  Skin: Negative.   Neurological: Negative for dizziness, weakness and headaches.  Psychiatric/Behavioral: Positive for memory loss. Negative for depression. The patient does not have insomnia.      Past Medical History  Diagnosis Date  . Dementia   . Muscle weakness   . Osteoporosis   . Alzheimer disease   . Failure to thrive in adult   . Dementia   . CVA (cerebral vascular accident)     speech and language d/o deficits  . Weakness   . Hyperlipemia   . Dysphagia   . Anemia   . PVD (peripheral vascular disease)   . COPD (chronic obstructive pulmonary disease)   . Glaucoma (increased eye pressure)   . Hypertension   . S/P BKA (below knee  amputation) unilateral 01/21/2006    left  . Renal artery stenosis    Past Surgical History  Procedure Laterality Date  . Leg amputation below knee      left   Social History:   reports that he has quit smoking. He does not have any smokeless tobacco history on file. He reports that he does not drink alcohol or use illicit drugs.  No family history on file.  Medications: Patient's Medications  New Prescriptions   No medications on file  Previous Medications   AMINO ACIDS-PROTEIN HYDROLYS (FEEDING SUPPLEMENT, PRO-STAT SUGAR FREE 64,) LIQD    Take 30 mLs by mouth 2 (two) times daily.   AMLODIPINE (NORVASC) 10 MG TABLET    Take 10 mg by mouth daily.   ASPIRIN 81 MG CHEWABLE TABLET    Chew 81 mg by mouth daily.   BRIMONIDINE (ALPHAGAN P) 0.1 % SOLN    Place 1 drop into both eyes 2 (two) times daily.   CALCIUM-VITAMIN D (OSCAL WITH D) 500-200 MG-UNIT PER TABLET    Take 1 tablet by mouth 2 (two) times daily.   CLOPIDOGREL (PLAVIX) 75 MG TABLET    Take 75 mg by mouth daily.   DENOSUMAB (PROLIA) 60 MG/ML SOLN INJECTION    Inject 60 mg into the skin every 6 (six) months.    DESMOPRESSIN (DDAVP) 0.01 % NASAL SOLUTION    Place 10 mcg into the nose 3 (three) times daily.  DORZOLAMIDE (TRUSOPT) 2 % OPHTHALMIC SOLUTION    Place 1 drop into both eyes 2 (two) times daily.   FEEDING SUPPLEMENT, RESOURCE BREEZE, (RESOURCE BREEZE) LIQD    Take 1 Container by mouth 2 (two) times daily between meals.   GABAPENTIN (NEURONTIN) 100 MG CAPSULE    Take 100 mg by mouth 2 (two) times daily.   HYDRALAZINE (APRESOLINE) 50 MG TABLET    Take 50 mg by mouth 3 (three) times daily.   HYDROCODONE-ACETAMINOPHEN (NORCO/VICODIN) 5-325 MG PER TABLET    Take one tablet by mouth twice daily for chronic pain. Do not exceed 4gm of Tylenol in 24 hours. Take one tablet by mouth every 6 hours as needed for pain   HYDROXYPROPYL METHYLCELLULOSE (ISOPTO TEARS) 2.5 % OPHTHALMIC SOLUTION    Place 1 drop into both eyes 3 (three) times  daily.   MAGNESIUM HYDROXIDE (MILK OF MAGNESIA) 400 MG/5ML SUSPENSION    Take 30 mLs by mouth daily as needed. For constipation   MEMANTINE (NAMENDA XR) 28 MG CP24 24 HR CAPSULE    Take 28 mg by mouth daily.   MIRTAZAPINE (REMERON) 15 MG TABLET    Take 15 mg by mouth at bedtime.   MULTIPLE VITAMINS-MINERALS (CERTA-VITE PO)    Take 1 tablet by mouth daily.   NITROGLYCERIN (NITRODUR - DOSED IN MG/24 HR) 0.2 MG/HR    Place 1 patch onto the skin daily.   POLYETHYLENE GLYCOL (MIRALAX / GLYCOLAX) PACKET    Take 17 g by mouth daily.   TRAVOPROST, BAK FREE, (TRAVATAN) 0.004 % SOLN OPHTHALMIC SOLUTION    Place 1 drop into both eyes at bedtime.  Modified Medications   No medications on file  Discontinued Medications   No medications on file     Physical Exam:  Filed Vitals:   03/24/15 1121  BP: 140/70  Pulse: 52  Temp: 98.4 F (36.9 C)  Resp: 20  Weight: 165 lb (74.844 kg)    Physical Exam  Constitutional: He is well-developed, well-nourished, and in no distress.  HENT:  Mouth/Throat: Oropharynx is clear and moist. No oropharyngeal exudate.  Eyes: Conjunctivae and EOM are normal. Pupils are equal, round, and reactive to light.  Neck: Normal range of motion. Neck supple.  Cardiovascular: Normal rate, regular rhythm and normal heart sounds.   Pulmonary/Chest: Effort normal and breath sounds normal.  Abdominal: Soft. Bowel sounds are normal. He exhibits no distension.  Musculoskeletal: He exhibits no edema or tenderness.  Left BKA; self propels in [facility]  Neurological: He is alert.  Skin: Skin is warm and dry. He is not diaphoretic.  Psychiatric: Affect normal.  Vitals reviewed.    Labs reviewed: Basic Metabolic Panel:  Recent Labs  05/02/14 0530 05/03/14 0705 05/04/14 0443  NA 153* 150* 145  K 4.5 3.7 3.7  CL 117* 112 107  CO2 '19 25 25  ' GLUCOSE 115* 137* 136*  BUN 49* 44* 35*  CREATININE 2.40* 2.03* 1.77*  CALCIUM 9.8 9.4 8.7   Liver Function Tests:  Recent Labs   05/01/14 1713 05/02/14 0530  AST 50* 34  ALT 39 35  ALKPHOS 80 90  BILITOT <0.2* 0.8  PROT 6.3 7.3  ALBUMIN 2.3* 2.4*   No results for input(s): LIPASE, AMYLASE in the last 8760 hours. No results for input(s): AMMONIA in the last 8760 hours. CBC:  Recent Labs  05/01/14 1713 05/02/14 0530  WBC 15.0* 15.2*  NEUTROABS 11.2* 11.6*  HGB 6.4* 11.8*  HCT 20.3* 37.2*  MCV 86.0 85.9  PLT 247 212   CBC with Diff  Result: 02/16/2014 5:47 PM ( Status: F )  WBC 5.2 4.0-10.5 K/uL SLN  RBC 3.94 L 4.22-5.81 MIL/uL SLN  Hemoglobin 10.6 L 13.0-17.0 g/dL SLN  Hematocrit 33.1 L 39.0-52.0 % SLN  MCV 84.0 78.0-100.0 fL SLN  MCH 26.9 26.0-34.0 pg SLN  MCHC 32.0 30.0-36.0 g/dL SLN  RDW 16.0 H 11.5-15.5 % SLN  Platelet Count 192 150-400 K/uL SLN  Granulocyte % 69 43-77 % SLN  Absolute Gran 3.6 1.7-7.7 K/uL SLN  Lymph % 20 12-46 % SLN  Absolute Lymph 1.0 0.7-4.0 K/uL SLN  Mono % 7 3-12 % SLN  Absolute Mono 0.4 0.1-1.0 K/uL SLN  Eos % 3 0-5 % SLN  Absolute Eos 0.2 0.0-0.7 K/uL SLN  Baso % 1 0-1 % SLN  Absolute Baso 0.1 0.0-0.1 K/uL SLN  Smear Review Criteria for review not met SLN  Basic Metabolic Panel  Result: 8/56/3149 7:15 PM ( Status: F )  Sodium 150 H 135-145 mEq/L SLN  Potassium 4.1 3.5-5.3 mEq/L SLN  Chloride 114 H 96-112 mEq/L SLN  CO2 28 19-32 mEq/L SLN  Glucose 147 H 70-99 mg/dL SLN  BUN 41 H 6-23 mg/dL SLN  Creatinine 1.55 H 0.50-1.35 mg/dL SLN  Calcium 9.6  CBC NO Diff (Complete Blood Count)    Result: 06/21/2014 10:16 AM   ( Status: F )     C WBC 5.1     4.0-10.5 K/uL SLN   RBC 3.74   L 4.22-5.81 MIL/uL SLN   Hemoglobin 9.7   L 13.0-17.0 g/dL SLN   Hematocrit 31.1   L 39.0-52.0 % SLN   MCV 83.2     78.0-100.0 fL SLN   MCH 25.9   L 26.0-34.0 pg SLN   MCHC 31.2     30.0-36.0 g/dL SLN   RDW 16.3   H 11.5-15.5 % SLN   Platelet Count 194     150-400 K/uL SLN   Comprehensive Metabolic Panel    Result: 06/21/2014 11:17 AM   ( Status: F )       Sodium 145      135-145 mEq/L SLN   Potassium 4.4     3.5-5.3 mEq/L SLN   Chloride 112     96-112 mEq/L SLN   CO2 31     19-32 mEq/L SLN   Glucose 82     70-99 mg/dL SLN   BUN 39   H 6-23 mg/dL SLN   Creatinine 1.73   H 0.50-1.35 mg/dL SLN   Bilirubin, Total 0.3     0.2-1.2 mg/dL SLN   Alkaline Phosphatase 54     39-117 U/L SLN   AST/SGOT 12     0-37 U/L SLN   ALT/SGPT <8     0-53 U/L SLN   Total Protein 5.5   L 6.0-8.3 g/dL SLN   Albumin 2.9   L 3.5-5.2 g/dL SLN   Calcium 9.0    CBC with Diff    Result: 10/07/2014 11:29 AM   ( Status: F )     C WBC 5.4     4.0-10.5 K/uL SLN   RBC 3.90   L 4.22-5.81 MIL/uL SLN   Hemoglobin 10.2   L 13.0-17.0 g/dL SLN   Hematocrit 31.9   L 39.0-52.0 % SLN   MCV 81.8     78.0-100.0 fL SLN   MCH 26.2     26.0-34.0 pg SLN   MCHC 32.0     30.0-36.0 g/dL  SLN   RDW 15.1     11.5-15.5 % SLN   Platelet Count 179     150-400 K/uL SLN   Granulocyte % 50     43-77 % SLN   Absolute Gran 2.7     1.7-7.7 K/uL SLN   Lymph % 33     12-46 % SLN   Absolute Lymph 1.8     0.7-4.0 K/uL SLN   Mono % 11     3-12 % SLN   Absolute Mono 0.6     0.1-1.0 K/uL SLN   Eos % 5     0-5 % SLN   Absolute Eos 0.3     0.0-0.7 K/uL SLN   Baso % 1     0-1 % SLN   Absolute Baso 0.1     0.0-0.1 K/uL SLN   Smear Review Criteria for review not met  SLN   MPV 10.1     9.4-12.4 fL SLN   Comprehensive Metabolic Panel    Result: 10/07/2014 12:24 PM   ( Status: F )       Sodium 147   H 135-145 mEq/L SLN   Potassium 4.5     3.5-5.3 mEq/L SLN   Chloride 117   H 96-112 mEq/L SLN   CO2 24     19-32 mEq/L SLN   Glucose 88     70-99 mg/dL SLN   BUN 37   H 6-23 mg/dL SLN   Creatinine 1.63   H 0.50-1.35 mg/dL SLN   Bilirubin, Total 0.3     0.2-1.2 mg/dL SLN   Alkaline Phosphatase 53     39-117 U/L SLN   AST/SGOT 12     0-37 U/L SLN   ALT/SGPT <8     0-53 U/L SLN   Total Protein 5.5   L 6.0-8.3 g/dL SLN   Albumin 3.2   L 3.5-5.2 g/dL SLN   Calcium 8.9     Iron and IBC    Result: 01/29/2015 11:37 AM    ( Status: F )     C Iron 63     42-165 ug/dL SLN   UIBC 103   L 125-400 ug/dL SLN   TIBC 166   L 215-435 ug/dL SLN   %SAT 38     20-55 % SLN   CBC NO Diff (Complete Blood Count)    Result: 01/29/2015 10:17 AM   ( Status: F )       WBC 4.6     4.0-10.5 K/uL SLN   RBC 3.60   L 4.22-5.81 MIL/uL SLN   Hemoglobin 9.4   L 13.0-17.0 g/dL SLN   Hematocrit 29.8   L 39.0-52.0 % SLN   MCV 82.8     78.0-100.0 fL SLN   MCH 26.1     26.0-34.0 pg SLN   MCHC 31.5     30.0-36.0 g/dL SLN   RDW 15.9   H 11.5-15.5 % SLN   Platelet Count 228     150-400 K/uL SLN   MPV 9.1     8.6-12.4 fL SLN   Basic Metabolic Panel    Result: 01/29/2015 11:37 AM   ( Status: F )       Sodium 144     135-145 mEq/L SLN   Potassium 4.2     3.5-5.3 mEq/L SLN   Chloride 111     96-112 mEq/L SLN   CO2 25     19-32 mEq/L SLN   Glucose 80  70-99 mg/dL SLN   BUN 39   H 6-23 mg/dL SLN   Creatinine 1.82   H 0.50-1.35 mg/dL SLN   Calcium 9.6   Assessment/Plan  1. Essential hypertension Blood pressures randing from 130-162/60-82 in the last month. Will cont current blood pressure regimen   2. Bradycardia, sinus Bradycardia remains stable  3. Insomnia -reports it is better sometimes than other, remeron will also benefit this.   4. Depression Following with psych services, added remeron with good effect. Weights have been stable and pt without decrease mood or depression  5. Chronic pain syndrome Denies any pain at today's visit, cont current medication regimen at this time.   6. Constipation, unspecified constipation type -well controlled on current regimen

## 2015-04-08 ENCOUNTER — Non-Acute Institutional Stay (SKILLED_NURSING_FACILITY): Payer: Medicare Other | Admitting: Internal Medicine

## 2015-04-08 ENCOUNTER — Other Ambulatory Visit: Payer: Self-pay | Admitting: *Deleted

## 2015-04-08 DIAGNOSIS — M25551 Pain in right hip: Secondary | ICD-10-CM | POA: Diagnosis not present

## 2015-04-08 DIAGNOSIS — M1611 Unilateral primary osteoarthritis, right hip: Secondary | ICD-10-CM

## 2015-04-08 MED ORDER — HYDROCODONE-ACETAMINOPHEN 5-325 MG PO TABS: ORAL_TABLET | ORAL | Status: DC

## 2015-04-08 NOTE — Telephone Encounter (Signed)
[facility] 

## 2015-04-09 NOTE — Progress Notes (Addendum)
Patient ID: Joseph ReedyJohn Ponce, male   DOB: 07-Jan-1924, 81 y.o.   MRN: [id number]                PROGRESS NOTE  DATE:  04/08/2015             [facility]                     LEVEL OF CARE:   SNF   Acute Visit                  CHIEF COMPLAINT:  Right leg pain.       HISTORY OF PRESENT ILLNESS:  I have been asked to see Joseph Ponce with regards to pain in his right leg.  According to the staff, this occasionally keeps him awake.  He is on Tylenol 500 b.i.d. and Norco 5/325 p.o. b.i.d.       In talking to the patient, he rubs his right medial upper thigh.  He tells me that this is worse when he stands, and he has been working with therapy with his left leg prosthesis and this is when it bothers him most of the time.    PHYSICAL EXAMINATION:   MUSCULOSKELETAL:   EXTREMITIES:   RIGHT LOWER EXTREMITY:   Right leg:  He has an artificial right knee.   This does not appear to be unstable and moving this causes no pain.  When I move his right hip in internal and external rotation, this is where most of the pain seems to come from and especially in internal rotation.   CIRCULATION/VASCULAR:      EDEMA/VARICOSITIES/ARTERIAL:  He probably has PAD and venous insufficiency, although I do not think this is causing a problem.       ASSESSMENT/PLAN:             Right hip pain.  I think this is the issue here.  He has a previous left below-knee amputation, walking with his canes and mechanics.   I will get an x-ray of the right hip.  I suspect this will show degenerative change.   He does not have pain over the greater trochanter. Therefore, I do not think this is a trochanteric bursitis.

## 2015-04-29 ENCOUNTER — Non-Acute Institutional Stay (SKILLED_NURSING_FACILITY): Payer: Medicare Other | Admitting: Internal Medicine

## 2015-04-29 DIAGNOSIS — N183 Chronic kidney disease, stage 3 (moderate): Secondary | ICD-10-CM

## 2015-04-29 DIAGNOSIS — T84020S Dislocation of internal right hip prosthesis, sequela: Secondary | ICD-10-CM

## 2015-05-04 NOTE — Progress Notes (Addendum)
Patient ID: Joseph Ponce, male   DOB: 1924/10/08, 81 y.o.   MRN: [id number]                PROGRESS NOTE  DATE:  04/29/2015               FAC[facility]: [name]                            LEVEL OF CARE:   SNF   Acute Visit                     CHIEF COMPLAINT:  Follow up right leg pain.      HISTORY OF PRESENT ILLNESS:  I saw this patient two weeks ago, at which time he was complaining about pain in his right leg.  The exam suggested that this was in his hip and I did an x-ray.  The x-ray shows that he has had a prior right hip prosthesis which is not in our record.  There is marked loosening of the femoral prosthesis and the inferior prosthesis is no longer within the femur, having migrated out laterally, and I suspect that is why he is in discomfort.  The mobile x-ray is dated 04/24/2015.    PAST MEDICAL HISTORY/PROBLEM LIST:         Acute on chronic renal failure.    Renal artery stenosis.    Peripheral vascular disease.    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    Hyperlipidemia.    Dementia.    COPD.    Anemia.    Sinus bradycardia.    PAST SURGICAL HISTORY:    Right total knee replacement.    Left below-knee amputation.    CURRENT MEDICATIONS:  Medication list is reviewed.               Norvasc 10 q.d.     ASA 81 q.d.       Plavix 75 q.d.       Namenda 28 mg daily.    Nitroglycerin patch daily.    MiraLAX 17 g q.day.    Alphagan ophthalmic.    Os-Cal 500 plus D, 1 tablet twice a day.    Trusopt ophthalmic.    Neurontin 100 b.i.d.       DDAVP b.i.d.       Norco 5/325, 1 p.o. b.i.d.       Reglan 5 mg a.c. meals.     Apresoline 50 mg q.8.    Travoprost ophthalmic.    Remeron 15 mg q.h.s.           ASSESSMENT/PLAN:                Dislocation of the inferior aspect of this patient's total hip replacement.  He will need to be nonweightbearing and see Orthopedics for this.  He is not a great surgical candidate.  At best, he could  stand and transfer.  He has not been ambulatory.  Nevertheless, he is complaining of pain in the right hip area.    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  I will repeat the basic metabolic panel here.    Hypertension.  I think this has been largely stable.    Chronic renal insufficiency.  Last checked in March with a creatinine of 1.82.  This has really been fairly stable.  He also is said to have renal artery stenosis. I do not have much information about this.

## 2015-05-06 ENCOUNTER — Non-Acute Institutional Stay (SKILLED_NURSING_FACILITY): Payer: Medicare Other | Admitting: Internal Medicine

## 2015-05-06 DIAGNOSIS — E232 Diabetes insipidus: Secondary | ICD-10-CM

## 2015-05-06 DIAGNOSIS — N183 Chronic kidney disease, stage 3 (moderate): Secondary | ICD-10-CM

## 2015-05-06 DIAGNOSIS — T84020S Dislocation of internal right hip prosthesis, sequela: Secondary | ICD-10-CM

## 2015-05-06 DIAGNOSIS — Z9889 Other specified postprocedural states: Secondary | ICD-10-CM | POA: Diagnosis not present

## 2015-05-06 DIAGNOSIS — E870 Hyperosmolality and hypernatremia: Secondary | ICD-10-CM

## 2015-05-06 NOTE — Progress Notes (Signed)
Patient ID: Joseph Ponce, male   DOB: 09-27-1924, 81 y.o.   MRN:                 PROGRESS NOTE  DATE:  05/06/2015               FACILITY: [name]                            LEVEL OF CARE:   SNF   Acute Visit                     CHIEF COMPLAINT:  Follow up right leg pain.      HISTORY OF PRESENT ILLNESS:  I saw this patient 3 weeks ago, at which time he was complaining about pain in his right leg.  The exam suggested that this was in his hip and I did an x-ray.  The x-ray shows that he has had a prior right hip prosthesis which is not in our record.  There is marked loosening of the femoral prosthesis and the inferior prosthesis is no longer within the femur, having migrated out laterally, and I suspect that is why he is in discomfort.  He has been to see [place] [facility]. The consultation states that he is to be chronically nonweightbearing on the right lower extremity. It was not felt that he was a candidate for surgical intervention secondary to multiple medical comorbidities and poor bone quality. I've seen the patient today and discussed this with the staff. He is currently being Nurse, adult transferred. He does not complain spontaneously of much pain.  PAST MEDICAL HISTORY/PROBLEM LIST:         Acute on chronic renal failure.    Renal artery stenosis.    Peripheral vascular disease.    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    Hyperlipidemia.    Dementia.    COPD.    Anemia.    Sinus bradycardia.    PAST SURGICAL HISTORY:    Right total knee replacement.    Left below-knee amputation.    CURRENT MEDICATIONS:  Medication list is reviewed.               Norvasc 10 q.d.     ASA 81 q.d.       Plavix 75 q.d.       Namenda 28 mg daily.    Nitroglycerin patch daily.    MiraLAX 17 g q.day.    Alphagan ophthalmic.    Os-Cal 500 plus D, 1 tablet twice a day.    Trusopt ophthalmic.    Neurontin 100 b.i.d.       DDAVP b.i.d.       Norco  5/325, 1 p.o. b.i.d.       Reglan 5 mg a.c. meals.     Apresoline 50 mg q.8.    Travoprost ophthalmic.    Remeron 15 mg q.h.s.           Review of systems Respiratory no shortness of breath Cardiac no chest pain GI no abdominal pain Musculoskeletal; in discussion with the nurse he occasionally complains of right hip pain. They are Hoyer lifting in terms of his transferring. He does not asked to go back to bed.  Physical examination; Blood pressure 150/78 temperature 97.5 pulse 64 respirations 22 O2 sat is 96% Respiratory; clear entry bilaterally Cardiac heart sounds are normal he does not appear to be clinically dehydrated Abdomen soft and nontender  Musculoskeletal; there is no swelling in his legs seems to be inverted some. I did not mobilize this  ASSESSMENT/PLAN:                Dislocation of the inferior aspect of this patient's total hip replacement. The total hip replacement did not previously appear in our records. He has not felt to be a surgical candidate [see discussion above] after consultation with [facility]. 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  Sodium 145  Hypertension.  I think this has been largely stable.    Chronic renal insufficiency.  Last checked in March with a creatinine of 1.82 now up to 2.06. It is possible this is due to renal artery stenosis listed on his problem list although I've never been able to really clarify the history here. Nevertheless at this point I think the only issue here would be chronic Blood pressure control and episodic monitoring. No further aggressive interventions at this point

## 2015-06-04 ENCOUNTER — Other Ambulatory Visit: Payer: Self-pay

## 2015-06-04 MED ORDER — HYDROCODONE-ACETAMINOPHEN 5-325 MG PO TABS: ORAL_TABLET | ORAL | Status: DC

## 2015-06-04 NOTE — Telephone Encounter (Signed)
Rx faxed to [facility] @ [phone], phone number [phone]  

## 2015-07-07 ENCOUNTER — Other Ambulatory Visit: Payer: Self-pay | Admitting: *Deleted

## 2015-07-07 MED ORDER — HYDROCODONE-ACETAMINOPHEN 5-325 MG PO TABS: ORAL_TABLET | ORAL | Status: DC

## 2015-07-07 NOTE — Telephone Encounter (Signed)
[facility] 

## 2015-07-08 ENCOUNTER — Non-Acute Institutional Stay (SKILLED_NURSING_FACILITY): Payer: Medicare Other | Admitting: Internal Medicine

## 2015-07-08 DIAGNOSIS — F05 Delirium due to known physiological condition: Secondary | ICD-10-CM

## 2015-07-08 DIAGNOSIS — I701 Atherosclerosis of renal artery: Secondary | ICD-10-CM | POA: Diagnosis not present

## 2015-07-08 DIAGNOSIS — E232 Diabetes insipidus: Secondary | ICD-10-CM

## 2015-07-08 DIAGNOSIS — R410 Disorientation, unspecified: Secondary | ICD-10-CM

## 2015-07-08 DIAGNOSIS — Z89512 Acquired absence of left leg below knee: Secondary | ICD-10-CM | POA: Diagnosis not present

## 2015-07-08 DIAGNOSIS — N183 Chronic kidney disease, stage 3 (moderate): Secondary | ICD-10-CM | POA: Diagnosis not present

## 2015-07-08 NOTE — Progress Notes (Signed)
Patient ID: Joseph Ponce, male   DOB: September 21, 1924, 81 y.o.   MRN: [id number]    Facility; [name] Chief complaint; edema of his hands History; this is a patient who was been in the building since 2005. He has a history of vascular dementia progressive failure to thrive a left BKA secondary to PAD he also carries a diagnosis of chronic hypernatremia on DDAVP although the exact workup that led to this is lost went to quit he. He also has a history of hypertension with renal artery stenosis and chronic renal insufficiency last checked in June with a BUN of 45 a creatinine of 2.06 and a sodium of 145  Past Medical History  Diagnosis Date  . Dementia   . Muscle weakness   . Osteoporosis   . Alzheimer disease   . Failure to thrive in adult   . Dementia   . CVA (cerebral vascular accident)     speech and language d/o deficits  . Weakness   . Hyperlipemia   . Dysphagia   . Anemia   . PVD (peripheral vascular disease)   . COPD (chronic obstructive pulmonary disease)   . Glaucoma (increased eye pressure)   . Hypertension   . S/P BKA (below knee amputation) unilateral 01/21/2006    left  . Renal artery stenosis     Past Surgical History  Procedure Laterality Date  . Leg amputation below knee      left    Current medications Norvasc 10 daily Enteric-coated aspirin 81 daily Plavix 75 daily Namenda XOR 28 daily Nitro 0.2 mg per hour patch daily off at night MiraLAX 17 g daily Alphagan ophthalmic Os-Cal plus D5 100 twice a day Trusopt ophthalmic   DDAVP 0 point once percent 10 g alternating nostrils twice a day Tylenol of thousand twice a day Gabapentin 1000 Norco 5/325 one tablet twice a day S Opto tears Reglan 5 mg before meals meals Hydralazine 50 every 8 Travatan daily at bedtime   Review of systems; not possible from the patient. Oral intake may be poor  Physical examination Gen. the patient is not nearly as awake as I'm used to seeing him. Vital signs pulse 52  respirations 18 O2 sat is 95% on room air Respiratory shallow but otherwise clear entry bilaterally Cardiac heart sounds are normal not overtly dehydrated Abdomen; no liver no spleen no masses. GU suprapubic tenderness is possible no clear CVA tenderness. Extremities; no edema left BKA site looks normal. Neurologic; the patient is too lethargic to per dissipate meaningfully although he seems to be able to move all his limbs to stimuli. Mental status; as above very listless although he will speak he doesn't engage in conversation my memory says that he used to be able to do this until recently.  Impression/plan #1 possible delirium. Lab work will be repeated especially his BUN and creatinine and sodium as well as his white count. He will need a urine for culture. #2 chronic renal failure. It would appear his baseline is generally in the 1.6-1.8 range. He has a history of renal artery stenosis #3 history of him diabetes insipidus. The exact workup for this as never really been obtainable although he is remained on DDAVP for some period of time.

## 2015-07-15 ENCOUNTER — Non-Acute Institutional Stay (SKILLED_NURSING_FACILITY): Payer: Medicare Other | Admitting: Internal Medicine

## 2015-07-15 DIAGNOSIS — E870 Hyperosmolality and hypernatremia: Secondary | ICD-10-CM

## 2015-07-15 DIAGNOSIS — R410 Disorientation, unspecified: Secondary | ICD-10-CM

## 2015-07-15 DIAGNOSIS — L03115 Cellulitis of right lower limb: Secondary | ICD-10-CM

## 2015-07-15 DIAGNOSIS — E232 Diabetes insipidus: Secondary | ICD-10-CM | POA: Diagnosis not present

## 2015-07-15 DIAGNOSIS — L89150 Pressure ulcer of sacral region, unstageable: Secondary | ICD-10-CM

## 2015-07-15 DIAGNOSIS — F05 Delirium due to known physiological condition: Secondary | ICD-10-CM

## 2015-07-17 NOTE — Progress Notes (Addendum)
Patient ID: Joseph Ponce, male   DOB: [birth year], 81 y.o.   MRN: [id number]                PROGRESS NOTE  DATE:  07/15/2015          FACILITY: [name]                  LEVEL OF CARE:   SNF   Acute Visit                         CHIEF COMPLAINT:  Dehydration, acute renal failure, decubitus ulcer on coccyx.    HISTORY OF PRESENT ILLNESS:  This is a patient whom I saw last week.  At that point, he looked lethargic, disengaged.  Lab work was ordered, including a urine culture.  He has a baseline creatinine in the 1.6 to 1.8 range.     The urinalysis came back showing positive nitrite, but negative leukocyte esterase.  There are only a few bacteria seen.    White count was 11, hemoglobin 8.5, differential count essentially normal with 75% granulocytes.     However, his sodium was 159, BUN 49, and creatinine of 3.42.  He has apparently received IV fluid over the weekend.    In a thorough evaluation by the wound care nurse, it is noted that he has decubitus ulcers on his coccyx and an area on his right heel.    He got 2 L of free fluid over the weekend.    A chest x-ray was also done, although I do not see these results.    PAST MEDICAL HISTORY/PROBLEM LIST:            Dementia.    Osteoporosis.    Failure to thrive.    CVA.      Peripheral vascular disease.    COPD.     Glaucoma.    Hypertension.     Renal artery stenosis.    Diabetes insipidus.     PAST SURGICAL HISTORY:               Left below-knee amputation.     CURRENT MEDICATIONS:  Medication list is reviewed.         Norvasc 10 q.d.      Enteric-coated aspirin 81 q.d.      Plavix 75 q.d.       Namenda XR 28 daily.      Nitro-Patch 0.2 mg.    MiraLAX 17 g daily.       Alphagan ophthalmic.    Os-Cal plus D 100 twice a day.     Trusopt ophthalmic.    DDAVP 10 mcg, alternating nostrils twice a day.     Norco 5/325, 1 tablet twice a day.      Reglan 5 mg a.c. meals.     Hydralazine 50  mg every 8 hours.    Travatan at bedtime.      REVIEW OF SYSTEMS:   Not possible from the patient secondary to dementia.  I am not completely certain about his oral intake.    PHYSICAL EXAMINATION:   VITAL SIGNS:     TEMPERATURE:  98.1.      PULSE:  78.     RESPIRATIONS:  20.     BLOOD PRESSURE:  145/85.   CHEST/RESPIRATORY:  Clear air entry bilaterally.    CARDIOVASCULAR:   CARDIAC:  Heart sounds are normal.  He is not overtly dehydrated.  GASTROINTESTINAL:   ABDOMEN:  No masses.     LIVER/SPLEEN/KIDNEYS:  No liver, no spleen.   GENITOURINARY:   BLADDER:  No suprapubic tenderness or costovertebral angle tenderness.     SKIN:   INSPECTION:  The patient has an area of impending breakdown over the lateral aspect of his right heel.  This has purulent drainage, which I have cultured.   No doubt, spreading cellulitis.   Also, over his right buttock into the coccyx area is an unstageable wound.      NEUROLOGICAL:    SENSATION/STRENGTH:  The patient is able to lift both arms, although I cannot grade test.   He is also able to lift both legs.  There is nothing obviously lateralizing.   PSYCHIATRIC:   MENTAL STATUS:  He is more alert than he was last week, probably secondary to the IV fluid.    ASSESSMENT/PLAN:                  Delirium, multifactorial, secondary to acute renal failure and hypernatremia.    Cellulitis of the right heel, as described.   I think there will be an open area here.  This needs antibiotics.   An empiric culture was done.    Unstageable wound over the coccyx.  Silver alginate for now.      History of diabetes insipidus.  He is on DDAVP.   I do not know how much I can increase this.  He did receive IV fluids over the weekend.  A repeat basic metabolic panel is in order.    History of chronic renal failure with a history of renal artery stenosis (?renovascular disease).  His normal creatinine is in the 1.6 to 1.8 range.    He will be started on  antibiotics  Repeat lab work is ordered.

## 2015-07-22 ENCOUNTER — Non-Acute Institutional Stay (SKILLED_NURSING_FACILITY): Payer: Medicare Other | Admitting: Internal Medicine

## 2015-07-22 ENCOUNTER — Other Ambulatory Visit: Payer: Self-pay | Admitting: Internal Medicine

## 2015-07-22 DIAGNOSIS — L89150 Pressure ulcer of sacral region, unstageable: Secondary | ICD-10-CM | POA: Diagnosis not present

## 2015-07-22 DIAGNOSIS — N179 Acute kidney failure, unspecified: Secondary | ICD-10-CM | POA: Diagnosis not present

## 2015-07-22 DIAGNOSIS — N390 Urinary tract infection, site not specified: Secondary | ICD-10-CM

## 2015-07-22 DIAGNOSIS — L03115 Cellulitis of right lower limb: Secondary | ICD-10-CM

## 2015-07-22 DIAGNOSIS — N189 Chronic kidney disease, unspecified: Secondary | ICD-10-CM | POA: Diagnosis not present

## 2015-07-22 NOTE — Progress Notes (Signed)
Patient ID: Joseph Ponce, male   DOB: [birth year], 81 y.o.   MRN: [id number]    Facility; [name] Chief complaint; acute on chronic renal failure, decubitus ulcer with surrounding infection History; I had seen this patient a week ago and a week before that. Initially started with acute delirium. Lab work showed a creatinine of 3.4 to a sodium of 159. He has central diabetes insipidus. He received 2 L of IV fluid and lab work was reordered. I was not aware of these results until today. These are from 96 at which time his sodium was still 154 potassium 4.3 BUN of 42 and creatinine at 3.17. I had also seen him last week with purulent drainage coming out of the medial side of his left heel culture of this grew Proteus fairly pansensitive. He also had a urine culture done from I and/2 that showed Providencia  which was sensitive to amoxicillin he had a course of this.   PAST MEDICAL HISTORY/PROBLEM LIST:            Dementia.    Osteoporosis.    Failure to thrive.    CVA.      Peripheral vascular disease.    COPD.     Glaucoma.    Hypertension.     Renal artery stenosis.    Diabetes insipidus.     PAST SURGICAL HISTORY:               Left below-knee amputation.     CURRENT MEDICATIONS:  Medication list is reviewed.         Norvasc 10 q.d.      Enteric-coated aspirin 81 q.d.      Plavix 75 q.d.       Namenda XR 28 daily.      Nitro-Patch 0.2 mg.    MiraLAX 17 g daily.       Alphagan ophthalmic.    Os-Cal plus D 100 twice a day.     Trusopt ophthalmic.    DDAVP 10 mcg, alternating nostrils twice a day.     Norco 5/325, 1 tablet twice a day.      Reglan 5 mg a.c. meals.     Hydralazine 50 mg every 8 hours.    Travatan at bedtime.       Social; his POA is a niece in [place] which showed the facility has contacted today. They still want aggressive care for this man. He remains a full code.    Review of systems is not possible from the patient;  nursing reports that he is not eating well perhaps 50% I am really unsure what he is taking in terms of fluids.  Physical examination Gen. patient still looks less vibrant than I am used to seeing.  Vitals; pressure 142/65 respiratory rate 18 temperature 99.6 pulse 62 O2 sat is 94% on room air. HEENT; dry mucous membranes Lymph; none palpable no cervical clavicular or axillary areas Respiratory shallow air entry bilaterally but no crackles or wheezes work of breathing is normal Cardiac; some degree of volume contraction. Heart sounds are soft. Abdomen; no liver no spleen no tenderness GU; bladder is not distended there is no CVA tenderness. Extremities; there is still a draining area over the medial aspect of the right heel with purulent drainage. I'd started him on doxycycline last week, the culture I did came back showing Proteus. Doxycycline was not one of the tested drugs. Skin; the unstageable wound over his coccyx last week is deteriorated there is  now a large area of erythema perhaps an inch to 2 inches there is some tenderness here but no crepitus. Neurologic; the patient is still awake but not is coherent as I'm used to seeing. There is no lateralizing neurologic findings. Vascular; peripheral pulses are not palpable in his remaining left foot however this does not look to be threatened. His right below-knee amputation site also appears stable  Impression/plan #1 acute on chronic renal failure. Unfortunately I was not aware of the lab work from 9/6 which still indicated significant acute on chronic renal failure and hypernatremia. He clearly needed more IV fluid at that point. I'm going to give him isotonic fluid now to stable at try to stabilize his creatinine before considering changing to half-normal saline. 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 He is been on DDAVP for as long as I have known him, the exact proof of this diagnosis is lost to  antiquity. #3 infected sacral decubitus ulcer which is unstageable. I did  not attempt open this today. #4 Proteus cellulitis in the medial aspect of his left heel.  He will require IV fluid and lots of it. I am starting with isotonic fluid at some point will follow with half-normal saline at some point to. Lab work will be ordered tomorrow. I am starting him on Zyvox IV and linezolid by mouth I am concerned about starting vancomycin in the face of this man's severe acute renal insufficiency. A Foley catheter will be placed to monitor urine output and also to protected deteriorating coccyx wound

## 2015-07-24 ENCOUNTER — Ambulatory Visit (HOSPITAL_COMMUNITY)
Admission: RE | Admit: 2015-07-24 | Discharge: 2015-07-24 | Disposition: A | Discharge status: Home / Self Care | Payer: Medicare Other | Source: Ambulatory Visit | Attending: Internal Medicine | Admitting: Internal Medicine

## 2015-07-24 ENCOUNTER — Other Ambulatory Visit: Payer: Self-pay | Admitting: Internal Medicine

## 2015-07-24 DIAGNOSIS — N3000 Acute cystitis without hematuria: Secondary | ICD-10-CM

## 2015-07-24 DIAGNOSIS — E870 Hyperosmolality and hypernatremia: Secondary | ICD-10-CM | POA: Diagnosis not present

## 2015-07-24 DIAGNOSIS — N390 Urinary tract infection, site not specified: Secondary | ICD-10-CM

## 2015-07-24 DIAGNOSIS — N179 Acute kidney failure, unspecified: Secondary | ICD-10-CM | POA: Diagnosis not present

## 2015-07-24 MED ORDER — HEPARIN SOD (PORK) LOCK FLUSH 100 UNIT/ML IV SOLN
INTRAVENOUS | Status: DC
Start: 2015-07-24 — End: 2015-07-25
  Filled 2015-07-24: qty 5

## 2015-07-24 MED ORDER — LIDOCAINE HCL 1 % IJ SOLN
INTRAMUSCULAR | Status: AC
  Filled 2015-07-24: qty 20

## 2015-07-24 NOTE — Procedures (Signed)
R arm PowerPICC placed under US and fluoroscopy No ptx on spot chest radiograph. No complication No blood loss. See complete dictation in [facility] PACS.  

## 2015-07-25 ENCOUNTER — Encounter (HOSPITAL_COMMUNITY): Payer: Self-pay | Admitting: Emergency Medicine

## 2015-07-25 ENCOUNTER — Inpatient Hospital Stay (HOSPITAL_COMMUNITY)
Admission: EM | Admit: 2015-07-25 | Discharge: 2015-08-01 | DRG: 682 | Disposition: A | Discharge status: Skilled Nursing Facility | Payer: Medicare Other | Attending: Internal Medicine | Admitting: Internal Medicine

## 2015-07-25 DIAGNOSIS — G894 Chronic pain syndrome: Secondary | ICD-10-CM | POA: Diagnosis present

## 2015-07-25 DIAGNOSIS — E785 Hyperlipidemia, unspecified: Secondary | ICD-10-CM | POA: Diagnosis present

## 2015-07-25 DIAGNOSIS — Z7982 Long term (current) use of aspirin: Secondary | ICD-10-CM

## 2015-07-25 DIAGNOSIS — L89153 Pressure ulcer of sacral region, stage 3: Secondary | ICD-10-CM | POA: Diagnosis present

## 2015-07-25 DIAGNOSIS — Z79899 Other long term (current) drug therapy: Secondary | ICD-10-CM

## 2015-07-25 DIAGNOSIS — J449 Chronic obstructive pulmonary disease, unspecified: Secondary | ICD-10-CM | POA: Diagnosis present

## 2015-07-25 DIAGNOSIS — R001 Bradycardia, unspecified: Secondary | ICD-10-CM | POA: Diagnosis not present

## 2015-07-25 DIAGNOSIS — E46 Unspecified protein-calorie malnutrition: Secondary | ICD-10-CM | POA: Diagnosis present

## 2015-07-25 DIAGNOSIS — I739 Peripheral vascular disease, unspecified: Secondary | ICD-10-CM

## 2015-07-25 DIAGNOSIS — G309 Alzheimer's disease, unspecified: Secondary | ICD-10-CM | POA: Diagnosis present

## 2015-07-25 DIAGNOSIS — I701 Atherosclerosis of renal artery: Secondary | ICD-10-CM | POA: Diagnosis present

## 2015-07-25 DIAGNOSIS — L89303 Pressure ulcer of unspecified buttock, stage 3: Secondary | ICD-10-CM | POA: Diagnosis not present

## 2015-07-25 DIAGNOSIS — E860 Dehydration: Secondary | ICD-10-CM | POA: Diagnosis present

## 2015-07-25 DIAGNOSIS — I129 Hypertensive chronic kidney disease with stage 1 through stage 4 chronic kidney disease, or unspecified chronic kidney disease: Secondary | ICD-10-CM | POA: Diagnosis present

## 2015-07-25 DIAGNOSIS — G9340 Encephalopathy, unspecified: Secondary | ICD-10-CM | POA: Diagnosis present

## 2015-07-25 DIAGNOSIS — Z515 Encounter for palliative care: Secondary | ICD-10-CM

## 2015-07-25 DIAGNOSIS — F0390 Unspecified dementia without behavioral disturbance: Secondary | ICD-10-CM | POA: Diagnosis present

## 2015-07-25 DIAGNOSIS — E871 Hypo-osmolality and hyponatremia: Secondary | ICD-10-CM | POA: Diagnosis present

## 2015-07-25 DIAGNOSIS — D649 Anemia, unspecified: Secondary | ICD-10-CM | POA: Diagnosis not present

## 2015-07-25 DIAGNOSIS — R1310 Dysphagia, unspecified: Secondary | ICD-10-CM | POA: Diagnosis present

## 2015-07-25 DIAGNOSIS — E876 Hypokalemia: Secondary | ICD-10-CM | POA: Diagnosis not present

## 2015-07-25 DIAGNOSIS — A419 Sepsis, unspecified organism: Secondary | ICD-10-CM

## 2015-07-25 DIAGNOSIS — N179 Acute kidney failure, unspecified: Principal | ICD-10-CM | POA: Diagnosis present

## 2015-07-25 DIAGNOSIS — Z89519 Acquired absence of unspecified leg below knee: Secondary | ICD-10-CM

## 2015-07-25 DIAGNOSIS — L89610 Pressure ulcer of right heel, unstageable: Secondary | ICD-10-CM | POA: Diagnosis present

## 2015-07-25 DIAGNOSIS — F329 Major depressive disorder, single episode, unspecified: Secondary | ICD-10-CM | POA: Diagnosis present

## 2015-07-25 DIAGNOSIS — Z87891 Personal history of nicotine dependence: Secondary | ICD-10-CM

## 2015-07-25 DIAGNOSIS — E440 Moderate protein-calorie malnutrition: Secondary | ICD-10-CM | POA: Diagnosis present

## 2015-07-25 DIAGNOSIS — R627 Adult failure to thrive: Secondary | ICD-10-CM | POA: Diagnosis present

## 2015-07-25 DIAGNOSIS — I498 Other specified cardiac arrhythmias: Secondary | ICD-10-CM

## 2015-07-25 DIAGNOSIS — F0280 Dementia in other diseases classified elsewhere without behavioral disturbance: Secondary | ICD-10-CM | POA: Diagnosis present

## 2015-07-25 DIAGNOSIS — E870 Hyperosmolality and hypernatremia: Secondary | ICD-10-CM | POA: Diagnosis present

## 2015-07-25 DIAGNOSIS — D638 Anemia in other chronic diseases classified elsewhere: Secondary | ICD-10-CM | POA: Diagnosis present

## 2015-07-25 DIAGNOSIS — L98429 Non-pressure chronic ulcer of back with unspecified severity: Secondary | ICD-10-CM | POA: Insufficient documentation

## 2015-07-25 DIAGNOSIS — L089 Local infection of the skin and subcutaneous tissue, unspecified: Secondary | ICD-10-CM | POA: Diagnosis present

## 2015-07-25 DIAGNOSIS — H409 Unspecified glaucoma: Secondary | ICD-10-CM | POA: Diagnosis present

## 2015-07-25 DIAGNOSIS — E232 Diabetes insipidus: Secondary | ICD-10-CM | POA: Diagnosis present

## 2015-07-25 DIAGNOSIS — N184 Chronic kidney disease, stage 4 (severe): Secondary | ICD-10-CM | POA: Diagnosis present

## 2015-07-25 DIAGNOSIS — Z7189 Other specified counseling: Secondary | ICD-10-CM | POA: Insufficient documentation

## 2015-07-25 DIAGNOSIS — L97509 Non-pressure chronic ulcer of other part of unspecified foot with unspecified severity: Secondary | ICD-10-CM

## 2015-07-25 DIAGNOSIS — Z89512 Acquired absence of left leg below knee: Secondary | ICD-10-CM

## 2015-07-25 DIAGNOSIS — M810 Age-related osteoporosis without current pathological fracture: Secondary | ICD-10-CM | POA: Diagnosis present

## 2015-07-25 DIAGNOSIS — F32A Depression, unspecified: Secondary | ICD-10-CM | POA: Diagnosis present

## 2015-07-25 DIAGNOSIS — I10 Essential (primary) hypertension: Secondary | ICD-10-CM | POA: Diagnosis present

## 2015-07-25 HISTORY — DX: Chronic kidney disease, stage 4 (severe): N18.4

## 2015-07-25 LAB — CBC WITH DIFFERENTIAL/PLATELET
Basophils Absolute: 0.1 10*3/uL (ref 0.0–0.1)
Basophils Relative: 1 %
Eosinophils Absolute: 0.6 10*3/uL (ref 0.0–0.7)
Eosinophils Relative: 4 %
HEMATOCRIT: 28.1 % — AB (ref 39.0–52.0)
Hemoglobin: 8.2 g/dL — ABNORMAL LOW (ref 13.0–17.0)
LYMPHS PCT: 12 %
Lymphs Abs: 1.8 10*3/uL (ref 0.7–4.0)
MCH: 25.4 pg — ABNORMAL LOW (ref 26.0–34.0)
MCHC: 29.2 g/dL — AB (ref 30.0–36.0)
MCV: 87 fL (ref 78.0–100.0)
MONO ABS: 1.3 10*3/uL — AB (ref 0.1–1.0)
MONOS PCT: 8 %
NEUTROS ABS: 11.9 10*3/uL — AB (ref 1.7–7.7)
Neutrophils Relative %: 75 %
Platelets: 344 10*3/uL (ref 150–400)
RBC: 3.23 MIL/uL — ABNORMAL LOW (ref 4.22–5.81)
RDW: 17.5 % — AB (ref 11.5–15.5)
WBC: 15.8 10*3/uL — ABNORMAL HIGH (ref 4.0–10.5)

## 2015-07-25 LAB — BASIC METABOLIC PANEL
ANION GAP: 7 (ref 5–15)
BUN: 60 mg/dL — ABNORMAL HIGH (ref 6–20)
CO2: 23 mmol/L (ref 22–32)
Calcium: 9.5 mg/dL (ref 8.9–10.3)
Chloride: 129 mmol/L — ABNORMAL HIGH (ref 101–111)
Creatinine, Ser: 3.5 mg/dL — ABNORMAL HIGH (ref 0.61–1.24)
GFR calc Af Amer: 16 mL/min — ABNORMAL LOW (ref >60)
GFR calc non Af Amer: 14 mL/min — ABNORMAL LOW (ref >60)
GLUCOSE: 125 mg/dL — AB (ref 65–99)
POTASSIUM: 3.8 mmol/L (ref 3.5–5.1)
Sodium: 159 mmol/L — ABNORMAL HIGH (ref 135–145)

## 2015-07-25 NOTE — H&P (Addendum)
[facility] History and Physical  Joseph Ponce NGE:[id number] DOB: [birth year] DOA: 07/25/2015  Referring physician: ED physician PCP: [name], MD  Specialists:   Chief Complaint: abnormal lab  HPI: Joseph Ponce is a 81 y.o. male with PMH of dementia, hypertension, hyperlipidemia, depression, osteoporosis, stroke, dysphasia, PVD, COPD, renal artery stenosis,CKD-IV, who presents abnormal lab.  Patient has dementia and is unable to provide accurate medical history, therefore, most of the history is obtained by discussing the case with ED and per EMS report. It seems that patient was sent from [facility] for elevated creatinine and BUN. He does not have complaints. Of note, patient was started with IV linezolid and zosyn for right foot heal infection and decubitus infection via PICC line on 07/24/15 (supposed to take for two weeks). Patient has pain over foot on palpation.   In ED, patient was found to have creatinine increased from 1.77 on 05/04/14 to 3.50, BUN 60, hyponatremia with a sodium of 159, WBC 15.8, bradycardia, temperature normal. Patient is admitted to inpatient for further reevaluation and treatment.  Where does patient live?  SNF Can patient participate in ADLs?    None   Review of Systems: could not be obtained due to dementia  Allergy: No Known Allergies  Past Medical History  Diagnosis Date  . Dementia   . Muscle weakness   . Osteoporosis   . Alzheimer disease   . Failure to thrive in adult   . Dementia   . CVA (cerebral vascular accident)     speech and language d/o deficits  . Weakness   . Hyperlipemia   . Dysphagia   . Anemia   . PVD (peripheral vascular disease)   . COPD (chronic obstructive pulmonary disease)   . Glaucoma (increased eye pressure)   . Hypertension   . S/P BKA (below knee amputation) unilateral 01/21/2006    left  . Renal artery stenosis   . CKD (chronic kidney disease), stage IV     Past Surgical History   Procedure Laterality Date  . Leg amputation below knee      left    Social History:  reports that he has quit smoking. He does not have any smokeless tobacco history on file. He reports that he does not drink alcohol or use illicit drugs.  Family History: No family history on file. could not be obtained due to dementia    Prior to Admission medications   Medication Sig Start Date End Date Taking? Authorizing Provider  acetaminophen (TYLENOL) 500 MG tablet Take 1,000 mg by mouth 2 (two) times daily.   Yes Historical Provider, MD  Amino Acids-Protein Hydrolys (FEEDING SUPPLEMENT, PRO-STAT SUGAR FREE 64,) LIQD Take 30 mLs by mouth 2 (two) times daily.   Yes Historical Provider, MD  amLODipine (NORVASC) 10 MG tablet Take 10 mg by mouth daily.   Yes Historical Provider, MD  aspirin 81 MG chewable tablet Chew 81 mg by mouth daily.   Yes Historical Provider, MD  brimonidine (ALPHAGAN P) 0.1 % SOLN Place 1 drop into both eyes 2 (two) times daily.   Yes Historical Provider, MD  calcium-vitamin D (OSCAL WITH D) 500-200 MG-UNIT per tablet Take 1 tablet by mouth 2 (two) times daily.   Yes Historical Provider, MD  denosumab (PROLIA) 60 MG/ML SOLN injection Inject 60 mg into the skin every 6 (six) months.    Yes Historical Provider, MD  desmopressin (DDAVP) 0.01 % nasal solution Place 10 mcg into the nose 3 (three) times daily.  Yes Historical Provider, MD  dorzolamide (TRUSOPT) 2 % ophthalmic solution Place 1 drop into both eyes 2 (two) times daily.   Yes Historical Provider, MD  feeding supplement, RESOURCE BREEZE, (RESOURCE BREEZE) LIQD Take 1 Container by mouth 2 (two) times daily between meals.   Yes Historical Provider, MD  gabapentin (NEURONTIN) 100 MG capsule Take 100 mg by mouth 2 (two) times daily.   Yes Historical Provider, MD  hydrALAZINE (APRESOLINE) 50 MG tablet Take 50 mg by mouth 3 (three) times daily.   Yes Historical Provider, MD  HYDROcodone-acetaminophen (NORCO/VICODIN) 5-325 MG per  tablet Take one tablet by mouth twice daily for chronic pain. Do not exceed 4gm of Tylenol in 24 hours. 07/07/15  Yes Tiffany L Reed, DO  hydroxypropyl methylcellulose (ISOPTO TEARS) 2.5 % ophthalmic solution Place 1 drop into both eyes 3 (three) times daily.   Yes Historical Provider, MD  linezolid (ZYVOX) 600 MG tablet Take 600 mg by mouth 2 (two) times daily.   Yes Historical Provider, MD  magnesium hydroxide (MILK OF MAGNESIA) 400 MG/5ML suspension Take 30 mLs by mouth daily as needed. For constipation   Yes Historical Provider, MD  memantine (NAMENDA XR) 28 MG CP24 24 hr capsule Take 28 mg by mouth daily.   Yes Historical Provider, MD  metoCLOPramide (REGLAN) 5 MG tablet Take 5 mg by mouth 3 (three) times daily before meals.   Yes Historical Provider, MD  mirtazapine (REMERON) 15 MG tablet Take 15 mg by mouth at bedtime.   Yes Historical Provider, MD  Multiple Vitamins-Minerals (CERTA-VITE PO) Take 1 tablet by mouth daily.   Yes Historical Provider, MD  nitroGLYCERIN (NITRODUR - DOSED IN MG/24 HR) 0.2 mg/hr Place 1 patch onto the skin daily.   Yes Historical Provider, MD  piperacillin-tazobactam (ZOSYN) 2.25 (2-0.25) G injection Inject 2.25 g into the muscle every 8 (eight) hours. 2 week regimen starting at 6pm on 9/15   Yes Historical Provider, MD  polyethylene glycol (MIRALAX / GLYCOLAX) packet Take 17 g by mouth daily.   Yes Historical Provider, MD  saccharomyces boulardii (FLORASTOR) 250 MG capsule Take 250 mg by mouth 2 (two) times daily.   Yes Historical Provider, MD  senna (SENOKOT) 8.6 MG TABS tablet Take 1 tablet by mouth at bedtime.   Yes Historical Provider, MD  Travoprost, BAK Free, (TRAVATAN) 0.004 % SOLN ophthalmic solution Place 1 drop into both eyes at bedtime.   Yes Historical Provider, MD  vitamin C (ASCORBIC ACID) 500 MG tablet Take 500 mg by mouth 2 (two) times daily.   Yes Historical Provider, MD  zinc sulfate 220 MG capsule Take 220 mg by mouth daily.   Yes Historical  Provider, MD    Physical Exam: Filed Vitals:   07/26/15 0000 07/26/15 0015 07/26/15 0030 07/26/15 0115  BP: 149/57 156/52 143/51 162/62  Pulse: 61 65 63 67  Temp:    98.7 F (37.1 C)  TempSrc:    Oral  Resp: 13 12 11 16   Height:      Weight:      SpO2: 95% 97% 97% 94%   General: Not in acute distress. Dry mucus and membrane HEENT:       Eyes: PERRL, EOMI, no scleral icterus.       ENT: No discharge from the ears and nose, no pharynx injection, no tonsillar enlargement.        Neck: No JVD, no bruit, no mass felt. Heme: No neck lymph node enlargement. Cardiac: S1/S2, RRR, No murmurs, No  gallops or rubs. Pulm:  No rales, wheezing, rhonchi or rubs. Abd: Soft, nondistended, nontender, no rebound pain, no organomegaly, BS present. Ext: No pitting leg edema bilaterally. 2+DP/PT on Right side, L BKA. Musculoskeletal: No joint deformities, No joint redness or warmth, no limitation of ROM in spin. Skin: there is small lesion over R heal without discharge. Stage III decubitus in sacral area with pus on the surface. Neuro: Alert, not oriented X3, cranial nerves II-XII grossly intact, Moves all extremities.   Psych: Patient is not psychotic, no suicidal or hemocidal ideation.  Labs on Admission:  Basic Metabolic Panel:  Recent Labs Lab 07/25/15 2227 07/26/15 0206  NA 159* 159*  K 3.8 3.8  CL 129* 129*  CO2 23 22  GLUCOSE 125* 110*  BUN 60* 59*  CREATININE 3.50* 3.51*  CALCIUM 9.5 9.7   Liver Function Tests: No results for input(s): AST, ALT, ALKPHOS, BILITOT, PROT, ALBUMIN in the last 168 hours. No results for input(s): LIPASE, AMYLASE in the last 168 hours. No results for input(s): AMMONIA in the last 168 hours. CBC:  Recent Labs Lab 07/25/15 2227  WBC 15.8*  NEUTROABS 11.9*  HGB 8.2*  HCT 28.1*  MCV 87.0  PLT 344   Cardiac Enzymes: No results for input(s): CKTOTAL, CKMB, CKMBINDEX, TROPONINI in the last 168 hours.  BNP (last 3 results) No results for input(s):  BNP in the last 8760 hours.  ProBNP (last 3 results) No results for input(s): PROBNP in the last 8760 hours.  CBG: No results for input(s): GLUCAP in the last 168 hours.  Radiological Exams on Admission: Ir Fluoro Guide Cv Line Right  07/24/2015   CLINICAL DATA:  Urinary tract infection, needs venous access for antibiotics  EXAM: PICC PLACEMENT WITH ULTRASOUND AND FLUOROSCOPY  FLUOROSCOPY TIME:  12 seconds  TECHNIQUE: After informed consent via telephone from family was obtained, patient was placed in the supine position on angiographic table. Patency of the right brachial vein was confirmed with ultrasound with image documentation. An appropriate skin site was determined. Skin site was marked. Region was prepped using maximum barrier technique including cap and mask, sterile gown, sterile gloves, large sterile sheet, and Chlorhexidine as cutaneous antisepsis. The region was infiltrated locally with 1% lidocaine. Under real-time ultrasound guidance, the right brachial vein was accessed with a 21 gauge micropuncture needle; the needle tip within the vein was confirmed with ultrasound image documentation. Needle exchanged over a 018 guidewire for a peel-away sheath, through which a 5-French single-lumen power injectable PICC trimmed to 38cm was advanced, positioned with its tip near the cavoatrial junction. Spot chest radiograph confirms appropriate catheter position. Catheter was flushed per protocol and secured externally with 0 Prolene suture. The patient tolerated procedure well.  COMPLICATIONS: COMPLICATIONS none  IMPRESSION: 1. Technically successful five [place] single lumen power injectable PICC placement   Electronically Signed   By: Corlis Leak M.D.   On: 07/24/2015 15:34   Ir US Guide Vasc Access Right  07/24/2015   CLINICAL DATA:  Urinary tract infection, needs venous access for antibiotics  EXAM: PICC PLACEMENT WITH ULTRASOUND AND FLUOROSCOPY  FLUOROSCOPY TIME:  12 seconds  TECHNIQUE: After  informed consent via telephone from family was obtained, patient was placed in the supine position on angiographic table. Patency of the right brachial vein was confirmed with ultrasound with image documentation. An appropriate skin site was determined. Skin site was marked. Region was prepped using maximum barrier technique including cap and mask, sterile gown, sterile gloves, large sterile sheet,  and Chlorhexidine as cutaneous antisepsis. The region was infiltrated locally with 1% lidocaine. Under real-time ultrasound guidance, the right brachial vein was accessed with a 21 gauge micropuncture needle; the needle tip within the vein was confirmed with ultrasound image documentation. Needle exchanged over a 018 guidewire for a peel-away sheath, through which a 5-French single-lumen power injectable PICC trimmed to 38cm was advanced, positioned with its tip near the cavoatrial junction. Spot chest radiograph confirms appropriate catheter position. Catheter was flushed per protocol and secured externally with 0 Prolene suture. The patient tolerated procedure well.  COMPLICATIONS: COMPLICATIONS none  IMPRESSION: 1. Technically successful five [place] single lumen power injectable PICC placement   Electronically Signed   By: Corlis Leak M.D.   On: 07/24/2015 15:34    EKG: Independently reviewed.  Abnormal findings: T-wave flattening.  Assessment/Plan Principal Problem:   Acute renal failure superimposed on stage 4 chronic kidney disease Active Problems:   Dementia without behavioral disturbance   Anemia   Renal artery stenosis   S/P BKA (below knee amputation) unilateral   Hypertension   Glaucoma (increased eye pressure)   COPD (chronic obstructive pulmonary disease)   Hyperlipemia   Osteoporosis   Chronic pain syndrome   Bradycardia, sinus   Depression   Hypernatremia   Dehydration   Infection of right foot   Decubitus ulcer of buttock, stage 3   Protein-calorie malnutrition  AoCKD-IV: Baseline  Cre is 1.77 on 05/04/14, his Cre is 3.50 and BUN 60 on admission. Likely due to prerenal secondary to dehydration.  - IVF: 1L NS and then D5 at 75 cc/h - Check FeNa - US-renal - Follow up renal function by BMP  Hypernatremia: Likely due to not getting enough free water intake. Pt is clinically dehydrated. Patient was on DDAVP at SNF with unclear reason, but indicating possible central diabetes insipidus (DI). -will give 1L NS bolus and then D5W at 75 cc/h -check BMP q4h  -check serum and urine Osmo, urine sodium level -pt is on desmopressin (DDAVP), will continue -check tsh level  Dementia without behavioral disturbance: -continue Memantine  Anemia: hgb dropped from 11.8 on 04/12/14-8.2. -check FOBT -anemia panel  Hypertension: -Amlodipine amlodipine hydralazine  Depression: -Continue home medications: mirtazapine  COPD (chronic obstructive pulmonary disease): stable. - albuterol nbx prn  Hx of CVA: -ASA  Infection of right foot and decubitus ulcer of buttock: pt has been on IV inezolid and zosyn at SNF. Patient is not septic on admission. Hemodynamically stable. -will continue home Abx -consult to Wound care team -patient may need MRI-R foot to r/u osteo, but not clear about whether pt has any metals in his body. This need to be clarified before doing MRI. -Norco prn for pain -will get Procalcitonin and trend lactic acid levels -f/u blood culture  Protein-calorie malnutrition-moderate: -Continue nutrition supplement   DVT ppx: SQ Heparin    Code Status: Full code Family Communication: None at bed side.  Disposition Plan: Admit to inpatient   Date of Service 07/26/2015    Lorretta Harp [facility] Pager [phone]  If 7PM-7AM, please contact night-coverage [email or web address] Password [email or web address] 07/26/2015, 4:04 AM

## 2015-07-25 NOTE — ED Provider Notes (Signed)
CSN: [id number]     Arrival date & time 07/25/15  2146 History   First MD Initiated Contact with Patient 07/25/15 2213     Chief Complaint  Patient presents with  . Abnormal Lab   LEVEL 5 CAVEAT DUE TO DEMENTIA  The history is provided by the EMS personnel. The history is limited by the condition of the patient.  Patient presents for abnormal labs.  Per report, pt has had worsening renal failure and was sent for evaluation Onset is unknown.  His course is worsening.  Nothing improves his symptoms  Pt is unable to provide any further details due to dementia  Past Medical History  Diagnosis Date  . Dementia   . Muscle weakness   . Osteoporosis   . Alzheimer disease   . Failure to thrive in adult   . Dementia   . CVA (cerebral vascular accident)     speech and language d/o deficits  . Weakness   . Hyperlipemia   . Dysphagia   . Anemia   . PVD (peripheral vascular disease)   . COPD (chronic obstructive pulmonary disease)   . Glaucoma (increased eye pressure)   . Hypertension   . S/P BKA (below knee amputation) unilateral 01/21/2006    left  . Renal artery stenosis    Past Surgical History  Procedure Laterality Date  . Leg amputation below knee      left   No family history on file. Social History  Substance Use Topics  . Smoking status: Former Games developer  . Smokeless tobacco: None  . Alcohol Use: No    Review of Systems  Unable to perform ROS: Dementia      Allergies  Review of patient's allergies indicates no known allergies.  Home Medications   Prior to Admission medications   Medication Sig Start Date End Date Taking? Authorizing Myangel Summons  acetaminophen (TYLENOL) 500 MG tablet Take 1,000 mg by mouth 2 (two) times daily.   Yes Historical Eshika Reckart, MD  Amino Acids-Protein Hydrolys (FEEDING SUPPLEMENT, PRO-STAT SUGAR FREE 64,) LIQD Take 30 mLs by mouth 2 (two) times daily.   Yes Historical Calvary Difranco, MD  amLODipine (NORVASC) 10 MG tablet Take 10 mg by mouth  daily.   Yes Historical Kamariah Fruchter, MD  aspirin 81 MG chewable tablet Chew 81 mg by mouth daily.   Yes Historical Jarrod Mcenery, MD  brimonidine (ALPHAGAN P) 0.1 % SOLN Place 1 drop into both eyes 2 (two) times daily.   Yes Historical Asiah Browder, MD  calcium-vitamin D (OSCAL WITH D) 500-200 MG-UNIT per tablet Take 1 tablet by mouth 2 (two) times daily.   Yes Historical Ileen Kahre, MD  denosumab (PROLIA) 60 MG/ML SOLN injection Inject 60 mg into the skin every 6 (six) months.    Yes Historical Melis Trochez, MD  desmopressin (DDAVP) 0.01 % nasal solution Place 10 mcg into the nose 3 (three) times daily.   Yes Historical Roy Snuffer, MD  dorzolamide (TRUSOPT) 2 % ophthalmic solution Place 1 drop into both eyes 2 (two) times daily.   Yes Historical Omolara Carol, MD  feeding supplement, RESOURCE BREEZE, (RESOURCE BREEZE) LIQD Take 1 Container by mouth 2 (two) times daily between meals.   Yes Historical Josselyne Onofrio, MD  gabapentin (NEURONTIN) 100 MG capsule Take 100 mg by mouth 2 (two) times daily.   Yes Historical Alisia Vanengen, MD  hydrALAZINE (APRESOLINE) 50 MG tablet Take 50 mg by mouth 3 (three) times daily.   Yes Historical Sharine Cadle, MD  HYDROcodone-acetaminophen (NORCO/VICODIN) 5-325 MG per tablet Take one tablet by  mouth twice daily for chronic pain. Do not exceed 4gm of Tylenol in 24 hours. 07/07/15  Yes Tiffany L Reed, DO  hydroxypropyl methylcellulose (ISOPTO TEARS) 2.5 % ophthalmic solution Place 1 drop into both eyes 3 (three) times daily.   Yes Historical Manasvi Dickard, MD  linezolid (ZYVOX) 600 MG tablet Take 600 mg by mouth 2 (two) times daily.   Yes Historical Koston Hennes, MD  magnesium hydroxide (MILK OF MAGNESIA) 400 MG/5ML suspension Take 30 mLs by mouth daily as needed. For constipation   Yes Historical Angi Goodell, MD  memantine (NAMENDA XR) 28 MG CP24 24 hr capsule Take 28 mg by mouth daily.   Yes Historical Lakeitha Basques, MD  metoCLOPramide (REGLAN) 5 MG tablet Take 5 mg by mouth 3 (three) times daily before meals.   Yes  Historical Treyton Slimp, MD  mirtazapine (REMERON) 15 MG tablet Take 15 mg by mouth at bedtime.   Yes Historical Steward Sames, MD  Multiple Vitamins-Minerals (CERTA-VITE PO) Take 1 tablet by mouth daily.   Yes Historical Casmere Hollenbeck, MD  nitroGLYCERIN (NITRODUR - DOSED IN MG/24 HR) 0.2 mg/hr Place 1 patch onto the skin daily.   Yes Historical Melody Cirrincione, MD  piperacillin-tazobactam (ZOSYN) 2.25 (2-0.25) G injection Inject 2.25 g into the muscle every 8 (eight) hours. 2 week regimen starting at 6pm on 9/15   Yes Historical Shantrice Rodenberg, MD  polyethylene glycol (MIRALAX / GLYCOLAX) packet Take 17 g by mouth daily.   Yes Historical Dariush Mcnellis, MD  saccharomyces boulardii (FLORASTOR) 250 MG capsule Take 250 mg by mouth 2 (two) times daily.   Yes Historical Kana Reimann, MD  senna (SENOKOT) 8.6 MG TABS tablet Take 1 tablet by mouth at bedtime.   Yes Historical Nettie Wyffels, MD  Travoprost, BAK Free, (TRAVATAN) 0.004 % SOLN ophthalmic solution Place 1 drop into both eyes at bedtime.   Yes Historical Arieonna Medine, MD  vitamin C (ASCORBIC ACID) 500 MG tablet Take 500 mg by mouth 2 (two) times daily.   Yes Historical Rekha Hobbins, MD  zinc sulfate 220 MG capsule Take 220 mg by mouth daily.   Yes Historical Anora Schwenke, MD   BP 153/56 mmHg  Pulse 65  Temp(Src) 97.9 F (36.6 C) (Oral)  Resp 16  Ht  (1.702 m)  Wt 170 lb (77.111 kg)  BMI 26.62 kg/m2  SpO2 94% Physical Exam CONSTITUTIONAL: frail, elderly HEAD: Normocephalic/atraumatic EYES: EOMI ENMT: Mucous membranes dry NECK: supple no meningeal signs CV: S1/S2 noted LUNGS: Lungs are clear to auscultation bilaterally ABDOMEN: soft, nontender NEURO: Pt is awake/alert.  He will follow some commands.  He appears confused.  He moans throughout the exam.  He can move all 4 extremities EXTREMITIES: PICC line to right UE.  S/p left BKA SKIN: warm, color normal, wound noted to right heel, no drainage/crepitus noted PSYCH: unable to assess  ED Course  Procedures  CRITICAL  CARE Performed by: Joya Gaskins Total critical care time: 31 Critical care time was exclusive of separately billable procedures and treating other patients. Critical care was necessary to treat or prevent imminent or life-threatening deterioration. Critical care was time spent personally by me on the following activities: development of treatment plan with patient and/or surrogate as well as nursing, discussions with consultants, evaluation of patient's response to treatment, examination of patient, obtaining history from patient or surrogate, ordering and performing treatments and interventions, ordering and review of laboratory studies, ordering and review of radiographic studies, pulse oximetry and re-evaluation of patient's condition. PATIENT WITH HYPERNATREMIA (SODIUM AT 159)  10:48 PM Pt with h/o dementia and  diabetes insipidus here with worsening renal failure Per records and EPIC, pt has had worsening renal failure while at facility and adjustments have been made to correct this issue Repeat labs drawn at this time Pt is stable He is currently with PICC line and IV antibiotics for sacral decubitus wound 12:07 AM Pt with significant hypernatremia, worsened from previous labs/notes in EPIC (previous labs stated in EPIC notes were in low 150s) Will need admission due to worsening metabolic abnormalities D/w dr Clyde Lundborg, will admit to hospital He will arrange appropriate fluids for this patient Pt updated on plan Pt awake/alert at this time  Labs Review Labs Reviewed  BASIC METABOLIC PANEL - Abnormal; Notable for the following:    Sodium 159 (*)    Chloride 129 (*)    Glucose, Bld 125 (*)    BUN 60 (*)    Creatinine, Ser 3.50 (*)    GFR calc non Af Amer 14 (*)    GFR calc Af Amer 16 (*)    All other components within normal limits  CBC WITH DIFFERENTIAL/PLATELET - Abnormal; Notable for the following:    WBC 15.8 (*)    RBC 3.23 (*)    Hemoglobin 8.2 (*)    HCT 28.1 (*)     MCH 25.4 (*)    MCHC 29.2 (*)    RDW 17.5 (*)    Neutro Abs 11.9 (*)    Monocytes Absolute 1.3 (*)    All other components within normal limits    I have personally reviewed and evaluated these  lab results as part of my medical decision-making.   EKG Interpretation   Date/Time:  Friday July 25 2015 22:25:52 EDT Ventricular Rate:  62 PR Interval:  64 QRS Duration: 95 QT Interval:  437 QTC Calculation: 444 R Axis:   28 Text Interpretation:  Sinus rhythm Short PR interval Left ventricular  hypertrophy Artifact in lead(s) V1 No significant change since last  tracing Confirmed by Bebe Shaggy  MD, Dorinda Hill ([id number]) on 07/25/2015 10:43:59 PM      MDM   Final diagnoses:  Hypernatremia  Acute renal failure, unspecified acute renal failure type  Anemia, unspecified anemia type    Nursing notes including past medical history and social history reviewed and considered in documentation Labs/vital reviewed myself and considered during evaluation     Zadie Rhine, MD 07/26/15 0013

## 2015-07-25 NOTE — ED Notes (Signed)
Pt was sent from [facility] for elevated creatinine and bun. Pt  Has no complaints.

## 2015-07-26 ENCOUNTER — Encounter (HOSPITAL_COMMUNITY): Payer: Self-pay | Admitting: Internal Medicine

## 2015-07-26 ENCOUNTER — Inpatient Hospital Stay (HOSPITAL_COMMUNITY): Payer: Medicare Other

## 2015-07-26 DIAGNOSIS — N184 Chronic kidney disease, stage 4 (severe): Secondary | ICD-10-CM

## 2015-07-26 DIAGNOSIS — Z87891 Personal history of nicotine dependence: Secondary | ICD-10-CM | POA: Diagnosis not present

## 2015-07-26 DIAGNOSIS — N179 Acute kidney failure, unspecified: Secondary | ICD-10-CM | POA: Diagnosis present

## 2015-07-26 DIAGNOSIS — F329 Major depressive disorder, single episode, unspecified: Secondary | ICD-10-CM | POA: Diagnosis present

## 2015-07-26 DIAGNOSIS — E785 Hyperlipidemia, unspecified: Secondary | ICD-10-CM | POA: Diagnosis present

## 2015-07-26 DIAGNOSIS — G894 Chronic pain syndrome: Secondary | ICD-10-CM | POA: Diagnosis present

## 2015-07-26 DIAGNOSIS — Z515 Encounter for palliative care: Secondary | ICD-10-CM | POA: Diagnosis not present

## 2015-07-26 DIAGNOSIS — E870 Hyperosmolality and hypernatremia: Secondary | ICD-10-CM | POA: Diagnosis present

## 2015-07-26 DIAGNOSIS — F0280 Dementia in other diseases classified elsewhere without behavioral disturbance: Secondary | ICD-10-CM | POA: Diagnosis present

## 2015-07-26 DIAGNOSIS — R1310 Dysphagia, unspecified: Secondary | ICD-10-CM | POA: Diagnosis present

## 2015-07-26 DIAGNOSIS — Z79899 Other long term (current) drug therapy: Secondary | ICD-10-CM | POA: Diagnosis not present

## 2015-07-26 DIAGNOSIS — L089 Local infection of the skin and subcutaneous tissue, unspecified: Secondary | ICD-10-CM | POA: Diagnosis present

## 2015-07-26 DIAGNOSIS — L89303 Pressure ulcer of unspecified buttock, stage 3: Secondary | ICD-10-CM | POA: Diagnosis present

## 2015-07-26 DIAGNOSIS — I739 Peripheral vascular disease, unspecified: Secondary | ICD-10-CM | POA: Diagnosis present

## 2015-07-26 DIAGNOSIS — D649 Anemia, unspecified: Secondary | ICD-10-CM | POA: Insufficient documentation

## 2015-07-26 DIAGNOSIS — M810 Age-related osteoporosis without current pathological fracture: Secondary | ICD-10-CM | POA: Diagnosis present

## 2015-07-26 DIAGNOSIS — I701 Atherosclerosis of renal artery: Secondary | ICD-10-CM | POA: Diagnosis present

## 2015-07-26 DIAGNOSIS — E232 Diabetes insipidus: Secondary | ICD-10-CM | POA: Diagnosis present

## 2015-07-26 DIAGNOSIS — D638 Anemia in other chronic diseases classified elsewhere: Secondary | ICD-10-CM | POA: Diagnosis present

## 2015-07-26 DIAGNOSIS — R001 Bradycardia, unspecified: Secondary | ICD-10-CM | POA: Diagnosis present

## 2015-07-26 DIAGNOSIS — E871 Hypo-osmolality and hyponatremia: Secondary | ICD-10-CM | POA: Diagnosis present

## 2015-07-26 DIAGNOSIS — L89610 Pressure ulcer of right heel, unstageable: Secondary | ICD-10-CM | POA: Diagnosis present

## 2015-07-26 DIAGNOSIS — I129 Hypertensive chronic kidney disease with stage 1 through stage 4 chronic kidney disease, or unspecified chronic kidney disease: Secondary | ICD-10-CM | POA: Diagnosis present

## 2015-07-26 DIAGNOSIS — E860 Dehydration: Secondary | ICD-10-CM | POA: Diagnosis present

## 2015-07-26 DIAGNOSIS — E440 Moderate protein-calorie malnutrition: Secondary | ICD-10-CM | POA: Diagnosis present

## 2015-07-26 DIAGNOSIS — Z89512 Acquired absence of left leg below knee: Secondary | ICD-10-CM | POA: Diagnosis not present

## 2015-07-26 DIAGNOSIS — E46 Unspecified protein-calorie malnutrition: Secondary | ICD-10-CM | POA: Diagnosis not present

## 2015-07-26 DIAGNOSIS — R627 Adult failure to thrive: Secondary | ICD-10-CM | POA: Diagnosis present

## 2015-07-26 DIAGNOSIS — J449 Chronic obstructive pulmonary disease, unspecified: Secondary | ICD-10-CM | POA: Diagnosis present

## 2015-07-26 DIAGNOSIS — G9340 Encephalopathy, unspecified: Secondary | ICD-10-CM | POA: Diagnosis present

## 2015-07-26 DIAGNOSIS — G309 Alzheimer's disease, unspecified: Secondary | ICD-10-CM | POA: Diagnosis present

## 2015-07-26 DIAGNOSIS — L89153 Pressure ulcer of sacral region, stage 3: Secondary | ICD-10-CM | POA: Diagnosis present

## 2015-07-26 DIAGNOSIS — E876 Hypokalemia: Secondary | ICD-10-CM | POA: Diagnosis not present

## 2015-07-26 DIAGNOSIS — F0390 Unspecified dementia without behavioral disturbance: Secondary | ICD-10-CM | POA: Diagnosis not present

## 2015-07-26 DIAGNOSIS — Z7982 Long term (current) use of aspirin: Secondary | ICD-10-CM | POA: Diagnosis not present

## 2015-07-26 DIAGNOSIS — H409 Unspecified glaucoma: Secondary | ICD-10-CM | POA: Diagnosis present

## 2015-07-26 LAB — CBC
HCT: 29 % — ABNORMAL LOW (ref 39.0–52.0)
Hemoglobin: 8.3 g/dL — ABNORMAL LOW (ref 13.0–17.0)
MCH: 24.9 pg — AB (ref 26.0–34.0)
MCHC: 28.6 g/dL — ABNORMAL LOW (ref 30.0–36.0)
MCV: 87.1 fL (ref 78.0–100.0)
PLATELETS: 341 10*3/uL (ref 150–400)
RBC: 3.33 MIL/uL — AB (ref 4.22–5.81)
RDW: 17.5 % — AB (ref 11.5–15.5)
WBC: 16.1 10*3/uL — ABNORMAL HIGH (ref 4.0–10.5)

## 2015-07-26 LAB — COMPREHENSIVE METABOLIC PANEL
ALT: 11 U/L — AB (ref 17–63)
AST: 20 U/L (ref 15–41)
Albumin: 1.7 g/dL — ABNORMAL LOW (ref 3.5–5.0)
Alkaline Phosphatase: 71 U/L (ref 38–126)
BILIRUBIN TOTAL: 0.2 mg/dL — AB (ref 0.3–1.2)
BUN: 55 mg/dL — AB (ref 6–20)
CALCIUM: 9.6 mg/dL (ref 8.9–10.3)
CO2: 24 mmol/L (ref 22–32)
CREATININE: 3.41 mg/dL — AB (ref 0.61–1.24)
Chloride: >130 mmol/L (ref 101–111)
GFR calc Af Amer: 17 mL/min — ABNORMAL LOW (ref >60)
GFR, EST NON AFRICAN AMERICAN: 15 mL/min — AB (ref >60)
Glucose, Bld: 144 mg/dL — ABNORMAL HIGH (ref 65–99)
Potassium: 3.6 mmol/L (ref 3.5–5.1)
Sodium: 161 mmol/L (ref 135–145)
Total Protein: 6.1 g/dL — ABNORMAL LOW (ref 6.5–8.1)

## 2015-07-26 LAB — GLUCOSE, CAPILLARY: Glucose-Capillary: 119 mg/dL — ABNORMAL HIGH (ref 65–99)

## 2015-07-26 LAB — URINE MICROSCOPIC-ADD ON

## 2015-07-26 LAB — LIPID PANEL
CHOL/HDL RATIO: 4.9 ratio
CHOLESTEROL: 117 mg/dL (ref 0–200)
HDL: 24 mg/dL — ABNORMAL LOW (ref >40)
LDL Cholesterol: 70 mg/dL (ref 0–99)
Triglycerides: 115 mg/dL (ref <150)
VLDL: 23 mg/dL (ref 0–40)

## 2015-07-26 LAB — BASIC METABOLIC PANEL
ANION GAP: 8 (ref 5–15)
ANION GAP: 7 (ref 5–15)
ANION GAP: 6 (ref 5–15)
BUN: 59 mg/dL — ABNORMAL HIGH (ref 6–20)
BUN: 53 mg/dL — ABNORMAL HIGH (ref 6–20)
BUN: 54 mg/dL — ABNORMAL HIGH (ref 6–20)
CALCIUM: 9.7 mg/dL (ref 8.9–10.3)
CALCIUM: 9.6 mg/dL (ref 8.9–10.3)
CHLORIDE: 124 mmol/L — AB (ref 101–111)
CO2: 22 mmol/L (ref 22–32)
CO2: 23 mmol/L (ref 22–32)
CO2: 24 mmol/L (ref 22–32)
Calcium: 9.5 mg/dL (ref 8.9–10.3)
Chloride: 129 mmol/L — ABNORMAL HIGH (ref 101–111)
Chloride: 128 mmol/L — ABNORMAL HIGH (ref 101–111)
Creatinine, Ser: 3.51 mg/dL — ABNORMAL HIGH (ref 0.61–1.24)
Creatinine, Ser: 3.28 mg/dL — ABNORMAL HIGH (ref 0.61–1.24)
Creatinine, Ser: 3.2 mg/dL — ABNORMAL HIGH (ref 0.61–1.24)
GFR calc non Af Amer: 16 mL/min — ABNORMAL LOW (ref >60)
GFR, EST AFRICAN AMERICAN: 16 mL/min — AB (ref >60)
GFR, EST AFRICAN AMERICAN: 18 mL/min — AB (ref >60)
GFR, EST AFRICAN AMERICAN: 18 mL/min — AB (ref >60)
GFR, EST NON AFRICAN AMERICAN: 14 mL/min — AB (ref >60)
GFR, EST NON AFRICAN AMERICAN: 15 mL/min — AB (ref >60)
Glucose, Bld: 110 mg/dL — ABNORMAL HIGH (ref 65–99)
Glucose, Bld: 138 mg/dL — ABNORMAL HIGH (ref 65–99)
Glucose, Bld: 156 mg/dL — ABNORMAL HIGH (ref 65–99)
POTASSIUM: 3.6 mmol/L (ref 3.5–5.1)
Potassium: 3.8 mmol/L (ref 3.5–5.1)
Potassium: 3.7 mmol/L (ref 3.5–5.1)
Sodium: 159 mmol/L — ABNORMAL HIGH (ref 135–145)
Sodium: 158 mmol/L — ABNORMAL HIGH (ref 135–145)
Sodium: 154 mmol/L — ABNORMAL HIGH (ref 135–145)

## 2015-07-26 LAB — MRSA PCR SCREENING: MRSA by PCR: POSITIVE — AB

## 2015-07-26 LAB — OSMOLALITY: Osmolality: 357 mOsm/kg — ABNORMAL HIGH (ref 275–300)

## 2015-07-26 LAB — URINALYSIS, ROUTINE W REFLEX MICROSCOPIC
BILIRUBIN URINE: NEGATIVE
Glucose, UA: NEGATIVE mg/dL
Hgb urine dipstick: NEGATIVE
KETONES UR: NEGATIVE mg/dL
Leukocytes, UA: NEGATIVE
NITRITE: NEGATIVE
Protein, ur: 100 mg/dL — AB
Specific Gravity, Urine: 1.019 (ref 1.005–1.030)
UROBILINOGEN UA: 0.2 mg/dL (ref 0.0–1.0)
pH: 5 (ref 5.0–8.0)

## 2015-07-26 LAB — APTT: APTT: 32 s (ref 24–37)

## 2015-07-26 LAB — LACTIC ACID, PLASMA
LACTIC ACID, VENOUS: 0.8 mmol/L (ref 0.5–2.0)
LACTIC ACID, VENOUS: 0.7 mmol/L (ref 0.5–2.0)

## 2015-07-26 LAB — PROCALCITONIN: Procalcitonin: 0.27 ng/mL

## 2015-07-26 LAB — OSMOLALITY, URINE: OSMOLALITY UR: 512 mosm/kg (ref 390–1090)

## 2015-07-26 LAB — SODIUM, URINE, RANDOM: SODIUM UR: 69 mmol/L

## 2015-07-26 LAB — PROTIME-INR
INR: 1.33 (ref 0.00–1.49)
Prothrombin Time: 16.6 seconds — ABNORMAL HIGH (ref 11.6–15.2)

## 2015-07-26 LAB — TSH: TSH: 1.671 u[IU]/mL (ref 0.350–4.500)

## 2015-07-26 MED ORDER — HYDROCODONE-ACETAMINOPHEN 5-325 MG PO TABS: 2.0000 | ORAL_TABLET | Freq: Four times a day (QID) | ORAL | Status: DC | PRN

## 2015-07-26 MED ORDER — SENNA 8.6 MG PO TABS
1.0000 | ORAL_TABLET | Freq: Every day | ORAL | Status: DC
  Administered 2015-07-26 – 2015-07-31 (×6): 8.6 mg via ORAL
  Filled 2015-07-26 (×6): qty 1

## 2015-07-26 MED ORDER — DEXTROSE 5 % IV SOLN
INTRAVENOUS | Status: DC
  Administered 2015-07-26 – 2015-07-28 (×4): via INTRAVENOUS

## 2015-07-26 MED ORDER — PIPERACILLIN-TAZOBACTAM IN DEX 2-0.25 GM/50ML IV SOLN
2.2500 g | Freq: Three times a day (TID) | INTRAVENOUS | Status: DC
  Administered 2015-07-26 – 2015-08-01 (×18): 2.25 g via INTRAVENOUS
  Filled 2015-07-26 (×22): qty 50

## 2015-07-26 MED ORDER — POLYETHYLENE GLYCOL 3350 17 G PO PACK: 17.0000 g | PACK | Freq: Every day | ORAL | Status: DC | PRN

## 2015-07-26 MED ORDER — ALBUTEROL SULFATE (2.5 MG/3ML) 0.083% IN NEBU: 2.5000 mg | INHALATION_SOLUTION | RESPIRATORY_TRACT | Status: DC | PRN

## 2015-07-26 MED ORDER — HYDRALAZINE HCL 50 MG PO TABS
50.0000 mg | ORAL_TABLET | Freq: Three times a day (TID) | ORAL | Status: DC
  Administered 2015-07-26 – 2015-08-01 (×15): 50 mg via ORAL
  Filled 2015-07-26 (×15): qty 1

## 2015-07-26 MED ORDER — SODIUM CHLORIDE 0.9 % IV BOLUS (SEPSIS)
1000.0000 mL | Freq: Once | INTRAVENOUS | Status: AC
  Administered 2015-07-26: 1000 mL via INTRAVENOUS

## 2015-07-26 MED ORDER — SODIUM CHLORIDE 0.9 % IJ SOLN
10.0000 mL | INTRAMUSCULAR | Status: DC | PRN
  Administered 2015-07-27 – 2015-07-29 (×2): 20 mL
  Administered 2015-08-01: 10 mL
  Filled 2015-07-26 (×3): qty 40

## 2015-07-26 MED ORDER — AMLODIPINE BESYLATE 10 MG PO TABS
10.0000 mg | ORAL_TABLET | Freq: Every day | ORAL | Status: DC
Start: 2015-07-26 — End: 2015-07-27
  Administered 2015-07-26 – 2015-07-27 (×2): 10 mg via ORAL
  Filled 2015-07-26 (×2): qty 1

## 2015-07-26 MED ORDER — LINEZOLID 600 MG PO TABS
600.0000 mg | ORAL_TABLET | Freq: Two times a day (BID) | ORAL | Status: DC
  Administered 2015-07-26 – 2015-07-27 (×2): 600 mg via ORAL
  Filled 2015-07-26 (×6): qty 1

## 2015-07-26 MED ORDER — DORZOLAMIDE HCL 2 % OP SOLN
1.0000 [drp] | Freq: Two times a day (BID) | OPHTHALMIC | Status: DC
  Administered 2015-07-26 – 2015-07-28 (×6): 1 [drp] via OPHTHALMIC
  Filled 2015-07-26: qty 10

## 2015-07-26 MED ORDER — CALCIUM CARBONATE-VITAMIN D 500-200 MG-UNIT PO TABS
1.0000 | ORAL_TABLET | Freq: Two times a day (BID) | ORAL | Status: DC
  Administered 2015-07-26 – 2015-07-31 (×7): 1 via ORAL
  Filled 2015-07-26 (×9): qty 1

## 2015-07-26 MED ORDER — GABAPENTIN 100 MG PO CAPS
100.0000 mg | ORAL_CAPSULE | Freq: Two times a day (BID) | ORAL | Status: DC
  Administered 2015-07-26 – 2015-07-31 (×10): 100 mg via ORAL
  Filled 2015-07-26 (×11): qty 1

## 2015-07-26 MED ORDER — MIRTAZAPINE 15 MG PO TABS
15.0000 mg | ORAL_TABLET | Freq: Every day | ORAL | Status: DC
  Administered 2015-07-26 – 2015-07-31 (×4): 15 mg via ORAL
  Filled 2015-07-26 (×5): qty 1

## 2015-07-26 MED ORDER — ADULT MULTIVITAMIN LIQUID CH
5.0000 mL | Freq: Every day | ORAL | Status: DC
  Administered 2015-07-27 – 2015-07-31 (×3): 5 mL via ORAL
  Filled 2015-07-26 (×7): qty 5

## 2015-07-26 MED ORDER — VITAMIN C 500 MG PO TABS
500.0000 mg | ORAL_TABLET | Freq: Two times a day (BID) | ORAL | Status: DC
  Administered 2015-07-26 – 2015-07-31 (×8): 500 mg via ORAL
  Filled 2015-07-26 (×10): qty 1

## 2015-07-26 MED ORDER — BOOST / RESOURCE BREEZE PO LIQD
1.0000 | Freq: Two times a day (BID) | ORAL | Status: DC
  Administered 2015-07-27 (×2): 1 via ORAL

## 2015-07-26 MED ORDER — HYPROMELLOSE (GONIOSCOPIC) 2.5 % OP SOLN
1.0000 [drp] | Freq: Three times a day (TID) | OPHTHALMIC | Status: DC
  Administered 2015-07-26 – 2015-07-29 (×10): 1 [drp] via OPHTHALMIC
  Filled 2015-07-26: qty 15

## 2015-07-26 MED ORDER — SACCHAROMYCES BOULARDII 250 MG PO CAPS
250.0000 mg | ORAL_CAPSULE | Freq: Two times a day (BID) | ORAL | Status: DC
  Administered 2015-07-26 – 2015-07-31 (×9): 250 mg via ORAL
  Filled 2015-07-26 (×10): qty 1

## 2015-07-26 MED ORDER — MEMANTINE HCL ER 28 MG PO CP24
28.0000 mg | ORAL_CAPSULE | Freq: Every day | ORAL | Status: DC
  Administered 2015-07-27 – 2015-07-31 (×3): 28 mg via ORAL
  Filled 2015-07-26 (×7): qty 1

## 2015-07-26 MED ORDER — SODIUM CHLORIDE 0.9 % IJ SOLN
3.0000 mL | Freq: Two times a day (BID) | INTRAMUSCULAR | Status: DC
  Administered 2015-07-26 – 2015-07-29 (×3): 3 mL via INTRAVENOUS

## 2015-07-26 MED ORDER — ACETAMINOPHEN 325 MG PO TABS
650.0000 mg | ORAL_TABLET | Freq: Four times a day (QID) | ORAL | Status: DC | PRN
  Administered 2015-07-31: 650 mg via ORAL
  Filled 2015-07-26: qty 2

## 2015-07-26 MED ORDER — PIPERACILLIN SOD-TAZOBACTAM SO 2.25 (2-0.25) G IV SOLR
2.2500 g | Freq: Three times a day (TID) | INTRAVENOUS | Status: DC
  Filled 2015-07-26 (×2): qty 2.25

## 2015-07-26 MED ORDER — MUPIROCIN 2 % EX OINT
1.0000 "application " | TOPICAL_OINTMENT | Freq: Two times a day (BID) | CUTANEOUS | Status: AC
  Administered 2015-07-26 – 2015-07-29 (×7): 1 via NASAL
  Filled 2015-07-26 (×2): qty 22

## 2015-07-26 MED ORDER — ONDANSETRON HCL 4 MG PO TABS: 4.0000 mg | ORAL_TABLET | Freq: Four times a day (QID) | ORAL | Status: DC | PRN

## 2015-07-26 MED ORDER — ZINC SULFATE 220 (50 ZN) MG PO CAPS
220.0000 mg | ORAL_CAPSULE | Freq: Every day | ORAL | Status: DC
  Administered 2015-07-26 – 2015-07-31 (×4): 220 mg via ORAL
  Filled 2015-07-26 (×5): qty 1

## 2015-07-26 MED ORDER — ASPIRIN 81 MG PO CHEW
81.0000 mg | CHEWABLE_TABLET | Freq: Every day | ORAL | Status: DC
  Administered 2015-07-26 – 2015-07-31 (×4): 81 mg via ORAL
  Filled 2015-07-26 (×5): qty 1

## 2015-07-26 MED ORDER — CHLORHEXIDINE GLUCONATE CLOTH 2 % EX PADS
6.0000 | MEDICATED_PAD | Freq: Every day | CUTANEOUS | Status: AC
  Administered 2015-07-26 – 2015-07-30 (×5): 6 via TOPICAL

## 2015-07-26 MED ORDER — BRIMONIDINE TARTRATE 0.2 % OP SOLN
1.0000 [drp] | Freq: Two times a day (BID) | OPHTHALMIC | Status: DC
  Administered 2015-07-26 – 2015-07-28 (×6): 1 [drp] via OPHTHALMIC
  Filled 2015-07-26: qty 5

## 2015-07-26 MED ORDER — DESMOPRESSIN ACE REFRIGERATED 0.01 % NA SOLN
1.0000 [drp] | Freq: Three times a day (TID) | NASAL | Status: DC
  Filled 2015-07-26 (×2): qty 2.5

## 2015-07-26 MED ORDER — CETYLPYRIDINIUM CHLORIDE 0.05 % MT LIQD
7.0000 mL | Freq: Two times a day (BID) | OROMUCOSAL | Status: DC
  Administered 2015-07-26 – 2015-07-31 (×8): 7 mL via OROMUCOSAL

## 2015-07-26 MED ORDER — NITROGLYCERIN 0.2 MG/HR TD PT24
0.2000 mg | MEDICATED_PATCH | Freq: Every day | TRANSDERMAL | Status: DC
  Administered 2015-07-26 – 2015-08-01 (×6): 0.2 mg via TRANSDERMAL
  Filled 2015-07-26 (×8): qty 1

## 2015-07-26 MED ORDER — INFLUENZA VAC SPLIT QUAD 0.5 ML IM SUSY
0.5000 mL | PREFILLED_SYRINGE | INTRAMUSCULAR | Status: AC
  Administered 2015-07-27: 0.5 mL via INTRAMUSCULAR
  Filled 2015-07-26: qty 0.5

## 2015-07-26 MED ORDER — PRO-STAT SUGAR FREE PO LIQD
30.0000 mL | Freq: Two times a day (BID) | ORAL | Status: DC
  Administered 2015-07-26 – 2015-07-31 (×8): 30 mL via ORAL
  Filled 2015-07-26 (×9): qty 30

## 2015-07-26 MED ORDER — ONDANSETRON HCL 4 MG/2ML IJ SOLN: 4.0000 mg | Freq: Four times a day (QID) | INTRAMUSCULAR | Status: DC | PRN

## 2015-07-26 MED ORDER — COLLAGENASE 250 UNIT/GM EX OINT
TOPICAL_OINTMENT | Freq: Every day | CUTANEOUS | Status: DC
  Administered 2015-07-26 – 2015-08-01 (×7): via TOPICAL
  Filled 2015-07-26: qty 30

## 2015-07-26 MED ORDER — LATANOPROST 0.005 % OP SOLN
1.0000 [drp] | Freq: Every day | OPHTHALMIC | Status: DC
  Administered 2015-07-26 – 2015-07-28 (×4): 1 [drp] via OPHTHALMIC
  Filled 2015-07-26: qty 2.5

## 2015-07-26 MED ORDER — HEPARIN SODIUM (PORCINE) 5000 UNIT/ML IJ SOLN
5000.0000 [IU] | Freq: Three times a day (TID) | INTRAMUSCULAR | Status: DC
  Administered 2015-07-26 – 2015-08-01 (×17): 5000 [IU] via SUBCUTANEOUS
  Filled 2015-07-26 (×16): qty 1

## 2015-07-26 NOTE — Progress Notes (Signed)
Patient Demographics  Joseph Ponce, is a 81 y.o. male, DOB - [birth year], ZOX:[id number]  Admit date - 07/25/2015   Admitting Physician Lorretta Harp, MD  Outpatient Primary MD for the patient is [name], MD  LOS - 0   Chief Complaint  Patient presents with  . Abnormal Lab         Subjective:   Courtland Reas today is confused , can't provide any complains.  Assessment & Plan    Principal Problem:   Acute renal failure superimposed on stage 4 chronic kidney disease Active Problems:   Dementia without behavioral disturbance   Anemia   Renal artery stenosis   S/P BKA (below knee amputation) unilateral   Hypertension   Glaucoma (increased eye pressure)   COPD (chronic obstructive pulmonary disease)   Hyperlipemia   Osteoporosis   Chronic pain syndrome   Bradycardia, sinus   Depression   Hypernatremia   Dehydration   Infection of right foot   Decubitus ulcer of buttock, stage 3   Protein-calorie malnutrition  Acute on chronic renal failure - This is prerenal secondary to volume depletion as evident by his hyponatremia and physical exam. - Continue with IV fluid, patient had indwelling Foley catheter recently inserted at the nursing home.  Hypernatremia - Secondary to dehydration and volume depletion, continue with D5W, will increase at 100 mL/h, monitor closely to avoid correction more than 12 mmol/L every 24Hours.  Acute encephalopathy - Patient with baseline dementia , appears to be more lethargic today, this is likely to hypernatremia   Right foot and sacral decubitus ulcer infection  - Patient on IV linezolid and Zosyn started at SNF  - Follow on blood cultures  - Will obtain x-ray of the foot for further evaluation  - will narrow antibiotics in 24 hours pending cultures  - Continue with wound care   Dementia without behavioral disturbance: -continue  Memantine  Anemia: hgb dropped from 11.8 (this is after PRBC transfusion) on 04/12/14 to 8.2. -check FOBT - Follow on anemia panel  Hypertension: -Amlodipine amlodipine hydralazine  Depression: -Continue home medications: mirtazapine  COPD (chronic obstructive pulmonary disease) - albuterol nbx prn  Hx of CVA: -ASA - Continue to hold Plavix, ending further workup.  Protein-calorie malnutrition-moderate: -Continue nutrition supplement   Code Status: Full  Family Communication: none at bedside  Disposition Plan: Back to SNF once stable    Procedures  None    Consults   None    Medications  Scheduled Meds: . amLODipine  10 mg Oral Daily  . antiseptic oral rinse  7 mL Mouth Rinse BID  . aspirin  81 mg Oral Daily  . brimonidine  1 drop Both Eyes BID  . calcium-vitamin D  1 tablet Oral BID WC  . Chlorhexidine Gluconate Cloth  6 each Topical Q0600  . collagenase   Topical Daily  . desmopressin  1 drop Nasal TID  . dorzolamide  1 drop Both Eyes BID  . feeding supplement  1 Container Oral BID BM  . feeding supplement (PRO-STAT SUGAR FREE 64)  30 mL Oral BID  . gabapentin  100 mg Oral BID  . heparin  5,000 Units Subcutaneous 3 times per day  . hydrALAZINE  50 mg Oral 3 times  per day  . hydroxypropyl methylcellulose / hypromellose  1 drop Both Eyes TID  . [START ON 07/27/2015] Influenza vac split quadrivalent PF  0.5 mL Intramuscular Tomorrow-1000  . latanoprost  1 drop Both Eyes QHS  . linezolid  600 mg Oral BID  . memantine  28 mg Oral Daily  . mirtazapine  15 mg Oral QHS  . multivitamin  5 mL Oral Daily  . mupirocin ointment  1 application Nasal BID  . nitroGLYCERIN  0.2 mg Transdermal Daily  . piperacillin-tazobactam (ZOSYN)  IV  2.25 g Intravenous 3 times per day  . saccharomyces boulardii  250 mg Oral BID  . senna  1 tablet Oral QHS  . sodium chloride  3 mL Intravenous Q12H  . vitamin C  500 mg Oral BID  . zinc sulfate  220 mg Oral Daily   Continuous  Infusions: . dextrose 75 mL/hr at 07/26/15 0310   PRN Meds:.acetaminophen, albuterol, HYDROcodone-acetaminophen, ondansetron **OR** ondansetron (ZOFRAN) IV, polyethylene glycol, sodium chloride  DVT Prophylaxis   Heparin -  Lab Results  Component Value Date   PLT 341 07/26/2015    Antibiotics    Anti-infectives    Start     Dose/Rate Route Frequency Ordered Stop   07/26/15 0300  piperacillin-tazobactam (ZOSYN) IVPB 2.25 g     2.25 g 100 mL/hr over 30 Minutes Intravenous 3 times per day 07/26/15 0257     07/26/15 0200  piperacillin-tazobactam (ZOSYN) injection 2.25 g  Status:  Discontinued     2.25 g Intramuscular Every 8 hours 07/26/15 0052 07/26/15 0257   07/26/15 0200  linezolid (ZYVOX) tablet 600 mg     600 mg Oral 2 times daily 07/26/15 0052 08/05/15 2159          Objective:   Filed Vitals:   07/26/15 0030 07/26/15 0115 07/26/15 0421 07/26/15 0742  BP: 143/51 162/62 143/54 129/55  Pulse: 63 67 72 54  Temp:  98.7 F (37.1 C) 98.6 F (37 C)   TempSrc:  Oral Oral   Resp: Height:      Weight:   70.852 kg (156 lb 3.2 oz)   SpO2: 97% 94% 93% 99%    Wt Readings from Last 3 Encounters:  07/26/15 70.852 kg (156 lb 3.2 oz)  03/24/15 74.844 kg (165 lb)  02/24/15 75.751 kg (167 lb)     Intake/Output Summary (Last 24 hours) at 07/26/15 1449 Last data filed at 07/26/15 0900  Gross per 24 hour  Intake 4265.83 ml  Output    250 ml  Net 4015.83 ml     Physical Exam Sleepy , ill-appearing, responsive to loud verbal stimuli , confused . Supple Neck,No JVD,  Symmetrical Chest wall movement, Good air movement bilaterally,  RRR,No Gallops,Rubs or new Murmurs,  +ve B.Sounds, Abd Soft, No tenderness, No organomegaly appriciated,  left BKA, right heel pressure ulcer, stage III decubitus sacral ulcer with minimal discharge   Data Review   Micro Results Recent Results (from the past 240 hour(s))  MRSA PCR Screening     Status: Abnormal   Collection  Time: 07/26/15  2:02 AM  Result Value Ref Range Status   MRSA by PCR POSITIVE (A) NEGATIVE Final    Comment:        The GeneXpert MRSA Assay (FDA approved for NASAL specimens only), is one component of a comprehensive MRSA colonization surveillance program. It is not intended to diagnose MRSA infection nor to guide or monitor treatment for MRSA  infections. RESULT CALLED TO, READ BACK BY AND VERIFIED WITH: [email or web address] [email or web address]  07/26/15 [facility]     Radiology Reports US Renal  07/26/2015   CLINICAL DATA:  81 year old male with acute renal insufficiency.  EXAM: RENAL / URINARY TRACT ULTRASOUND COMPLETE  COMPARISON:  None.  FINDINGS: Right Kidney:  Length: 11 cm. Diffuse increased renal echogenicity compatible with underlying medical renal disease. Clinical correlation is recommended. A 2.2 x 1.5 x 1.9 cm upper pole cyst is seen. There is no hydronephrosis. Evaluation for stone or underlying solid lesion is limited due to increased echogenicity.  Left Kidney:  Length: 11 cm. There are multiple cysts in the left kidney measuring up to 4.1 x 4.4 x 3.9 cm in the upper pole. There is diffuse increased renal parenchymal echogenicity. No hydronephrosis.  Bladder:  A Foley catheter is noted within the partially distended urinary bladder.  IMPRESSION: Increased bilateral renal echogenicity likely related side and aligned medical renal disease. Clinical correlation is recommended.  No hydronephrosis.  Bilateral renal cysts.   Electronically Signed   By: Elgie Collard M.D.   On: 07/26/2015 04:27   Ir Fluoro Guide Cv Line Right  07/24/2015   CLINICAL DATA:  Urinary tract infection, needs venous access for antibiotics  EXAM: PICC PLACEMENT WITH ULTRASOUND AND FLUOROSCOPY  FLUOROSCOPY TIME:  12 seconds  TECHNIQUE: After informed consent via telephone from family was obtained, patient was placed in the supine position on angiographic table. Patency of the right brachial vein was confirmed with ultrasound with  image documentation. An appropriate skin site was determined. Skin site was marked. Region was prepped using maximum barrier technique including cap and mask, sterile gown, sterile gloves, large sterile sheet, and Chlorhexidine as cutaneous antisepsis. The region was infiltrated locally with 1% lidocaine. Under real-time ultrasound guidance, the right brachial vein was accessed with a 21 gauge micropuncture needle; the needle tip within the vein was confirmed with ultrasound image documentation. Needle exchanged over a 018 guidewire for a peel-away sheath, through which a 5-French single-lumen power injectable PICC trimmed to 38cm was advanced, positioned with its tip near the cavoatrial junction. Spot chest radiograph confirms appropriate catheter position. Catheter was flushed per protocol and secured externally with 0 Prolene suture. The patient tolerated procedure well.  COMPLICATIONS: COMPLICATIONS none  IMPRESSION: 1. Technically successful five [place] single lumen power injectable PICC placement   Electronically Signed   By: Corlis Leak M.D.   On: 07/24/2015 15:34   Ir US Guide Vasc Access Right  07/24/2015   CLINICAL DATA:  Urinary tract infection, needs venous access for antibiotics  EXAM: PICC PLACEMENT WITH ULTRASOUND AND FLUOROSCOPY  FLUOROSCOPY TIME:  12 seconds  TECHNIQUE: After informed consent via telephone from family was obtained, patient was placed in the supine position on angiographic table. Patency of the right brachial vein was confirmed with ultrasound with image documentation. An appropriate skin site was determined. Skin site was marked. Region was prepped using maximum barrier technique including cap and mask, sterile gown, sterile gloves, large sterile sheet, and Chlorhexidine as cutaneous antisepsis. The region was infiltrated locally with 1% lidocaine. Under real-time ultrasound guidance, the right brachial vein was accessed with a 21 gauge micropuncture needle; the needle tip within  the vein was confirmed with ultrasound image documentation. Needle exchanged over a 018 guidewire for a peel-away sheath, through which a 5-French single-lumen power injectable PICC trimmed to 38cm was advanced, positioned with its tip near the cavoatrial junction. Spot chest radiograph confirms appropriate catheter position. Catheter was  flushed per protocol and secured externally with 0 Prolene suture. The patient tolerated procedure well.  COMPLICATIONS: COMPLICATIONS none  IMPRESSION: 1. Technically successful five [place] single lumen power injectable PICC placement   Electronically Signed   By: Corlis Leak M.D.   On: 07/24/2015 15:34     CBC  Recent Labs Lab 07/25/15 2227 07/26/15 0443  WBC 15.8* 16.1*  HGB 8.2* 8.3*  HCT 28.1* 29.0*  PLT 344 341  MCV 87.0 87.1  MCH 25.4* 24.9*  MCHC 29.2* 28.6*  RDW 17.5* 17.5*  LYMPHSABS 1.8  --   MONOABS 1.3*  --   EOSABS 0.6  --   BASOSABS 0.1  --     Chemistries   Recent Labs Lab 07/25/15 2227 07/26/15 0206 07/26/15 0443 07/26/15 1045  NA 159* 159* 161* 158*  K 3.8 3.8 3.6 3.7  CL 129* 129* >130* 128*  CO2 23 22 24 23   GLUCOSE 125* 110* 144* 138*  BUN 60* 59* 55* 53*  CREATININE 3.50* 3.51* 3.41* 3.28*  CALCIUM 9.5 9.7 9.6 9.6  AST  --   --  20  --   ALT  --   --  11*  --   ALKPHOS  --   --  71  --   BILITOT  --   --  0.2*  --    ------------------------------------------------------------------------------------------------------------------ estimated creatinine clearance is 14.3 mL/min (by C-G formula based on Cr of 3.28). ------------------------------------------------------------------------------------------------------------------ No results for input(s): HGBA1C in the last 72 hours. ------------------------------------------------------------------------------------------------------------------  Recent Labs  07/26/15 0443  CHOL 117  HDL 24*  LDLCALC 70  TRIG 161  CHOLHDL 4.9    ------------------------------------------------------------------------------------------------------------------  Recent Labs  07/26/15 1045  TSH 1.671   ------------------------------------------------------------------------------------------------------------------ No results for input(s): VITAMINB12, FOLATE, FERRITIN, TIBC, IRON, RETICCTPCT in the last 72 hours.  Coagulation profile  Recent Labs Lab 07/26/15 0206  INR 1.33    No results for input(s): DDIMER in the last 72 hours.  Cardiac Enzymes No results for input(s): CKMB, TROPONINI, MYOGLOBIN in the last 168 hours.  Invalid input(s): CK ------------------------------------------------------------------------------------------------------------------ Invalid input(s): POCBNP     Time Spent in minutes   40 minutes   ELGERGAWY, DAWOOD M.D on 07/26/2015 at 2:49 PM  Between 7am to 7pm - Pager - [phone]  After 7pm go to [email or web address] - password [email or web address]  [facility]   Office  [phone]

## 2015-07-26 NOTE — Progress Notes (Signed)
Critical lab values received:  Sodium-161 Chloride->130 MD notified. RN will continue to monitor patient.   Veatrice Kells, RN

## 2015-07-26 NOTE — Progress Notes (Signed)
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 Per patient's medicine record, he was on DDAVP  twice daily, not three times daily. Patient also was on Eucerin Cream to right leg/foot twice daily, Clonidine 0.1 mg every 8 hrs prn SBP> 180, Plavix 75 mg daily and 0.45 % normal saline at 100 cc/hr. Patient was not on Milk of Magnesia. Pharmacy [facility]) notified. Dr. Randol Kern texted.

## 2015-07-26 NOTE — Progress Notes (Signed)
PT Cancellation Note  Patient Details Name: Joseph Ponce MRN: [id number] DOB: 26-Apr-1924   Cancelled Treatment:    Reason Eval/Treat Not Completed: PT screened, no needs identified, will sign off. Pt is bedbound with advanced dementia, unable to follow commands. Not appropriate for PT intervention in acute setting.   Ilda Foil 07/26/2015, 12:45 PM

## 2015-07-26 NOTE — Consult Note (Signed)
WOC wound consult note Reason for Consult: foot and sacral pressure injury.  Pt not able to give me any history, not really responsive, but does grimace with turing him.  Does ask me "don't touch my foot" as I assess the right foot ulcer. Otherwise will not answer questions at all or follow commands. Wound type: Unstagable pressure injury sacrum; Unstageable pressure injury right lateral heel Pressure Ulcer POA: Yes Measurement: Right lateral heel: 1.0cm x 2.0cm x 0 Sacrum: 6cm x 9cm x 0.1cm  Wound bed: Right heel: 100% eschar, mildly fluctuant but no drainage. Sacrum: 100% soft, black, some yellow eschar, no drainge Drainage (amount, consistency, odor) see above Periwound: intact at each site, dry skin over the right heel  Dressing procedure/placement/frequency: Will add enzymatic debridement ointment to the sacral ulcer, however need to further evaluate family's goals for care.  Paint left heel with betadine daily, hope this will keep this site dry and stable.  Prevalon boot in place from SNF, on patient at time of admission for offloading the heel. Added LALM for pressure redistribution, patient does not turn himself.   Will reassess patient in a few days once admitting MD has addressed with family or support system what the goals of care for this patient are.  WOC team will follow along with you for weekly wound assessments.  Please notify me of any acute changes in the wounds or any new areas of concerns Joseph Pickup RN,CWOCN [phone]

## 2015-07-26 NOTE — Progress Notes (Signed)
Patient arrived on unit via stretcher. Patient alert to self. Patient oriented to staff, room and unit. Patient placed on telemetry monitor, CCMD notified. Skin assessment completed with two RN's. Check flow sheet. Patient denies having pain. No family at bedside. Orders have been reviewed and implemented. Call light has been placed within reach and bed alarm has been activated. RN will continue to monitor the patient  Rivka Barbara BSN, RN  Phone Number: [phone]

## 2015-07-27 ENCOUNTER — Inpatient Hospital Stay (HOSPITAL_COMMUNITY): Payer: Medicare Other

## 2015-07-27 DIAGNOSIS — N179 Acute kidney failure, unspecified: Secondary | ICD-10-CM | POA: Diagnosis not present

## 2015-07-27 LAB — BASIC METABOLIC PANEL
Anion gap: 6 (ref 5–15)
Anion gap: 7 (ref 5–15)
BUN: 53 mg/dL — AB (ref 6–20)
BUN: 47 mg/dL — AB (ref 6–20)
CHLORIDE: 125 mmol/L — AB (ref 101–111)
CO2: 23 mmol/L (ref 22–32)
CO2: 23 mmol/L (ref 22–32)
Calcium: 9.8 mg/dL (ref 8.9–10.3)
Calcium: 9.8 mg/dL (ref 8.9–10.3)
Chloride: 124 mmol/L — ABNORMAL HIGH (ref 101–111)
Creatinine, Ser: 2.91 mg/dL — ABNORMAL HIGH (ref 0.61–1.24)
Creatinine, Ser: 3.1 mg/dL — ABNORMAL HIGH (ref 0.61–1.24)
GFR calc Af Amer: 21 mL/min — ABNORMAL LOW (ref >60)
GFR calc Af Amer: 19 mL/min — ABNORMAL LOW (ref >60)
GFR, EST NON AFRICAN AMERICAN: 18 mL/min — AB (ref >60)
GFR, EST NON AFRICAN AMERICAN: 16 mL/min — AB (ref >60)
GLUCOSE: 143 mg/dL — AB (ref 65–99)
GLUCOSE: 138 mg/dL — AB (ref 65–99)
POTASSIUM: 3.4 mmol/L — AB (ref 3.5–5.1)
POTASSIUM: 3.4 mmol/L — AB (ref 3.5–5.1)
Sodium: 154 mmol/L — ABNORMAL HIGH (ref 135–145)
Sodium: 154 mmol/L — ABNORMAL HIGH (ref 135–145)

## 2015-07-27 LAB — IRON AND TIBC: IRON: 28 ug/dL — AB (ref 45–182)

## 2015-07-27 LAB — RETICULOCYTES
RBC.: 3.37 MIL/uL — AB (ref 4.22–5.81)
RETIC COUNT ABSOLUTE: 27 10*3/uL (ref 19.0–186.0)
RETIC CT PCT: 0.8 % (ref 0.4–3.1)

## 2015-07-27 LAB — FERRITIN: FERRITIN: 608 ng/mL — AB (ref 24–336)

## 2015-07-27 LAB — GLUCOSE, CAPILLARY: GLUCOSE-CAPILLARY: 133 mg/dL — AB (ref 65–99)

## 2015-07-27 LAB — FOLATE: Folate: 24.4 ng/mL (ref >5.9)

## 2015-07-27 LAB — VITAMIN B12: Vitamin B-12: 990 pg/mL — ABNORMAL HIGH (ref 180–914)

## 2015-07-27 LAB — SODIUM: Sodium: 149 mmol/L — ABNORMAL HIGH (ref 135–145)

## 2015-07-27 MED ORDER — DESMOPRESSIN ACE REFRIGERATED 0.01 % NA SOLN
1.0000 [drp] | Freq: Three times a day (TID) | NASAL | Status: DC
  Administered 2015-07-27 – 2015-07-28 (×2): 1 [drp] via NASAL
  Filled 2015-07-27 (×2): qty 2.5

## 2015-07-27 MED ORDER — SODIUM CHLORIDE 0.9 % IV BOLUS (SEPSIS)
250.0000 mL | Freq: Once | INTRAVENOUS | Status: DC
Start: 2015-07-27 — End: 2015-07-27

## 2015-07-27 MED ORDER — VANCOMYCIN HCL IN DEXTROSE 1-5 GM/200ML-% IV SOLN
1000.0000 mg | INTRAVENOUS | Status: DC
  Administered 2015-07-27 – 2015-07-31 (×3): 1000 mg via INTRAVENOUS
  Filled 2015-07-27 (×3): qty 200

## 2015-07-27 MED ORDER — RESOURCE THICKENUP CLEAR PO POWD
ORAL | Status: DC | PRN
  Filled 2015-07-27: qty 125

## 2015-07-27 NOTE — Progress Notes (Signed)
ANTIBIOTIC CONSULT NOTE - INITIAL   Pharmacy Consult for Vancomycin Indication: Wound infection  No Known Allergies  Patient Measurements: Height:  (170.2 cm) Weight: 160 lb (72.576 kg) IBW/kg (Calculated) : 66.1  Vital Signs: BP: 145/56 mmHg (09/18 0533) Pulse Rate: 47 (09/18 0533) Intake/Output from previous day: 09/17 0701 - 09/18 0700 In: 1353.3 [P.O.:160; I.V.:1093.3; IV Piggyback:100] Out: 125 [Urine:125] Intake/Output from this shift:    Labs:  Recent Labs  07/25/15 2227  07/26/15 0443 07/26/15 1045 07/26/15 1807 07/27/15 0400  WBC 15.8*  --  16.1*  --   --   --   HGB 8.2*  --  8.3*  --   --   --   PLT 344  --  341  --   --   --   CREATININE 3.50*  < > 3.41* 3.28* 3.20* 2.91*  < > = values in this interval not displayed. Estimated Creatinine Clearance: 16.1 mL/min (by C-G formula based on Cr of 2.91). No results for input(s): VANCOTROUGH, VANCOPEAK, VANCORANDOM, GENTTROUGH, GENTPEAK, GENTRANDOM, TOBRATROUGH, TOBRAPEAK, TOBRARND, AMIKACINPEAK, AMIKACINTROU, AMIKACIN in the last 72 hours.   Microbiology: Recent Results (from the past 720 hour(s))  MRSA PCR Screening     Status: Abnormal   Collection Time: 07/26/15  2:02 AM  Result Value Ref Range Status   MRSA by PCR POSITIVE (A) NEGATIVE Final    Comment:        The GeneXpert MRSA Assay (FDA approved for NASAL specimens only), is one component of a comprehensive MRSA colonization surveillance program. It is not intended to diagnose MRSA infection nor to guide or monitor treatment for MRSA infections. RESULT CALLED TO, READ BACK BY AND VERIFIED WITH: ZEE MAHMOUD@[id number]  07/26/15 MKELLY     Medical History: Past Medical History  Diagnosis Date  . Dementia   . Muscle weakness   . Osteoporosis   . Alzheimer disease   . Failure to thrive in adult   . Dementia   . CVA (cerebral vascular accident)     speech and language d/o deficits  . Weakness   . Hyperlipemia   . Dysphagia   . Anemia   .  PVD (peripheral vascular disease)   . COPD (chronic obstructive pulmonary disease)   . Glaucoma (increased eye pressure)   . Hypertension   . S/P BKA (below knee amputation) unilateral 01/21/2006    left  . Renal artery stenosis   . CKD (chronic kidney disease), stage IV    Assessment:  81 y/o M who presents with abnormal labs. Sent from [facility] with elevated creatinine and BUN. Pharmacy consulted to dose vancomycin for a wound infection. Pt has right foot sacral decubitus ulcer. SCr 3.5 on admission now to 2.91. Baseline ~2. CrCl ~ 16 mL/min. Patient initially on IV linezolid/Zosyn, and now switching from linezolid to Vanc.  Goal of Therapy:  Vancomycin trough level 15-20 mcg/ml  Plan:  Vancomycin IV 1000 mg q 48h F/u renal function, C&S, LOT, and clinical course  Sandi Carne, PharmD Pharmacy Resident Pager: [phone] 07/27/2015,8:23 AM

## 2015-07-27 NOTE — Progress Notes (Addendum)
Patient Demographics  Joseph Ponce, is a 81 y.o. male, DOB - 1923/11/28, NFA:[id number]  Admit date - 07/25/2015   Admitting Physician Lorretta Harp, MD  Outpatient Primary MD for the patient is [name], MD  LOS - 1   Chief Complaint  Patient presents with  . Abnormal Lab         Subjective:   [name] today is confused , can't provide any complains.  Assessment & Plan    Principal Problem:   Acute renal failure superimposed on stage 4 chronic kidney disease Active Problems:   Dementia without behavioral disturbance   Anemia   Renal artery stenosis   S/P BKA (below knee amputation) unilateral   Hypertension   Glaucoma (increased eye pressure)   COPD (chronic obstructive pulmonary disease)   Hyperlipemia   Osteoporosis   Chronic pain syndrome   Bradycardia, sinus   Depression   Hypernatremia   Dehydration   Infection of right foot   Decubitus ulcer of buttock, stage 3   Protein-calorie malnutrition  Acute on chronic renal failure - This is prerenal secondary to volume depletion as evident by his hyponatremia and physical exam. - Continue with IV fluid, patient had indwelling Foley catheter recently inserted at the nursing home.  Hypernatremia - Secondary to dehydration and volume depletion, still with significant hypernatremia despite being on D5W over last 24 hour, will increase rate to 125 mL/h, monitor closely to avoid correction more than 12 mmol/L every 24Hours.  Acute encephalopathy - Patient with baseline dementia, with worsening mental status secondary to hypernatremia, as well as metabolic secondary to infection - SLP evaluation appreciated  Right foot and sacral decubitus ulcer infection  - Patient on IV linezolid and Zosyn started at SNF  - Follow on blood cultures : No growth to date - Right foot x-ray with no evidence of ostial myelitis - We'll  discontinue IV linezolid, will start on IV vancomycin - Will check MRI of sacral spine to rule osteomyelitis,  Dementia without behavioral disturbance: -continue Memantine  Anemia: -  hgb dropped from 11.8 (this is after PRBC transfusion) on 04/12/14 to 8.2 on admission - check FOBT - Iron 28, ferritin 608, folate and B-12 within normal limit, workup significant for anemia of chronic illness  Hypertension: -Blood pressure acceptable, continue with hydralazine, hold amlodipine  Bradycardia - Symptomatic, continue telemetry monitor, no evidence of AV block on EKG.  Depression: -Continue home medications: mirtazapine  COPD (chronic obstructive pulmonary disease) - albuterol nbx prn  Hx of CVA: -ASA - Continue to hold Plavix, pending further workup.  Protein-calorie malnutrition-moderate: -Continue nutrition supplement   Code Status: DO NOT RESUSCITATE, discussed with POA Boyles, Chiquita  Family Communication: spoke with Boyles,Chiquita 9/18  Disposition Plan: Back to SNF once stable    Procedures  None    Consults   None    Medications  Scheduled Meds: . amLODipine  10 mg Oral Daily  . antiseptic oral rinse  7 mL Mouth Rinse BID  . aspirin  81 mg Oral Daily  . brimonidine  1 drop Both Eyes BID  . calcium-vitamin D  1 tablet Oral BID WC  . Chlorhexidine Gluconate Cloth  6 each Topical Q0600  . collagenase   Topical Daily  .  desmopressin  1 drop Nasal TID  . dorzolamide  1 drop Both Eyes BID  . feeding supplement  1 Container Oral BID BM  . feeding supplement (PRO-STAT SUGAR FREE 64)  30 mL Oral BID  . gabapentin  100 mg Oral BID  . heparin  5,000 Units Subcutaneous 3 times per day  . hydrALAZINE  50 mg Oral 3 times per day  . hydroxypropyl methylcellulose / hypromellose  1 drop Both Eyes TID  . latanoprost  1 drop Both Eyes QHS  . memantine  28 mg Oral Daily  . mirtazapine  15 mg Oral QHS  . multivitamin  5 mL Oral Daily  . mupirocin ointment  1  application Nasal BID  . nitroGLYCERIN  0.2 mg Transdermal Daily  . piperacillin-tazobactam (ZOSYN)  IV  2.25 g Intravenous 3 times per day  . saccharomyces boulardii  250 mg Oral BID  . senna  1 tablet Oral QHS  . sodium chloride  3 mL Intravenous Q12H  . vancomycin  1,000 mg Intravenous Q48H  . vitamin C  500 mg Oral BID  . zinc sulfate  220 mg Oral Daily   Continuous Infusions: . dextrose 125 mL/hr at 07/27/15 0853   PRN Meds:.acetaminophen, albuterol, HYDROcodone-acetaminophen, ondansetron **OR** ondansetron (ZOFRAN) IV, polyethylene glycol, RESOURCE THICKENUP CLEAR, sodium chloride  DVT Prophylaxis   Heparin -  Lab Results  Component Value Date   PLT 341 07/26/2015    Antibiotics    Anti-infectives    Start     Dose/Rate Route Frequency Ordered Stop   07/27/15 0900  vancomycin (VANCOCIN) IVPB 1000 mg/200 mL premix     1,000 mg 200 mL/hr over 60 Minutes Intravenous Every 48 hours 07/27/15 0830     07/26/15 0300  piperacillin-tazobactam (ZOSYN) IVPB 2.25 g     2.25 g 100 mL/hr over 30 Minutes Intravenous 3 times per day 07/26/15 0257     07/26/15 0200  piperacillin-tazobactam (ZOSYN) injection 2.25 g  Status:  Discontinued     2.25 g Intramuscular Every 8 hours 07/26/15 0052 07/26/15 0257   07/26/15 0200  linezolid (ZYVOX) tablet 600 mg  Status:  Discontinued     600 mg Oral 2 times daily 07/26/15 0052 07/27/15 0808          Objective:   Filed Vitals:   07/26/15 1818 07/26/15 2119 07/27/15 0533 07/27/15 0937  BP: 124/63 127/55 145/56 113/45  Pulse: 42 42 47 50  Temp:    98.2 F (36.8 C)  TempSrc:    Oral  Resp: 10 12 10 14   Height:  5\' 7"  (1.702 m)    Weight:  72.576 kg (160 lb)    SpO2: 97% 100% 98% 95%    Wt Readings from Last 3 Encounters:  07/26/15 72.576 kg (160 lb)  03/24/15 74.844 kg (165 lb)  02/24/15 75.751 kg (167 lb)     Intake/Output Summary (Last 24 hours) at 07/27/15 1514 Last data filed at 07/27/15 1312  Gross per 24 hour  Intake  1353.33 ml  Output    125 ml  Net 1228.33 ml     Physical Exam  ill-appearing, more awake today , confused . Supple Neck,No JVD,  Symmetrical Chest wall movement, Good air movement bilaterally,  RRR,No Gallops,Rubs or new Murmurs,  +ve B.Sounds, Abd Soft, No tenderness, No organomegaly appriciated,  left BKA, right heel pressure ulcer, stage III decubitus sacral ulcer with minimal discharge   Data Review   Micro Results Recent Results (from the past  240 hour(s))  Culture, blood (x 2)     Status: None (Preliminary result)   Collection Time: 07/26/15  1:59 AM  Result Value Ref Range Status   Specimen Description BLOOD RIGHT HAND  Final   Special Requests IN PEDIATRIC BOTTLE 3CC  Final   Culture NO GROWTH 1 DAY  Final   Report Status PENDING  Incomplete  MRSA PCR Screening     Status: Abnormal   Collection Time: 07/26/15  2:02 AM  Result Value Ref Range Status   MRSA by PCR POSITIVE (A) NEGATIVE Final    Comment:        The GeneXpert MRSA Assay (FDA approved for NASAL specimens only), is one component of a comprehensive MRSA colonization surveillance program. It is not intended to diagnose MRSA infection nor to guide or monitor treatment for MRSA infections. RESULT CALLED TO, READ BACK BY AND VERIFIED WITH: ZEE MAHMOUD@[id number]  07/26/15 MKELLY   Culture, blood (x 2)     Status: None (Preliminary result)   Collection Time: 07/26/15  2:06 AM  Result Value Ref Range Status   Specimen Description BLOOD LEFT HAND  Final   Special Requests IN PEDIATRIC BOTTLE 3CC  Final   Culture NO GROWTH 1 DAY  Final   Report Status PENDING  Incomplete    Radiology Reports US Renal  07/26/2015   CLINICAL DATA:  81 year old male with acute renal insufficiency.  EXAM: RENAL / URINARY TRACT ULTRASOUND COMPLETE  COMPARISON:  None.  FINDINGS: Right Kidney:  Length: 11 cm. Diffuse increased renal echogenicity compatible with underlying medical renal disease. Clinical correlation is recommended. A  2.2 x 1.5 x 1.9 cm upper pole cyst is seen. There is no hydronephrosis. Evaluation for stone or underlying solid lesion is limited due to increased echogenicity.  Left Kidney:  Length: 11 cm. There are multiple cysts in the left kidney measuring up to 4.1 x 4.4 x 3.9 cm in the upper pole. There is diffuse increased renal parenchymal echogenicity. No hydronephrosis.  Bladder:  A Foley catheter is noted within the partially distended urinary bladder.  IMPRESSION: Increased bilateral renal echogenicity likely related side and aligned medical renal disease. Clinical correlation is recommended.  No hydronephrosis.  Bilateral renal cysts.   Electronically Signed   By: Elgie Collard M.D.   On: 07/26/2015 04:27   Ir Fluoro Guide Cv Line Right  07/24/2015   CLINICAL DATA:  Urinary tract infection, needs venous access for antibiotics  EXAM: PICC PLACEMENT WITH ULTRASOUND AND FLUOROSCOPY  FLUOROSCOPY TIME:  12 seconds  TECHNIQUE: After informed consent via telephone from family was obtained, patient was placed in the supine position on angiographic table. Patency of the right brachial vein was confirmed with ultrasound with image documentation. An appropriate skin site was determined. Skin site was marked. Region was prepped using maximum barrier technique including cap and mask, sterile gown, sterile gloves, large sterile sheet, and Chlorhexidine as cutaneous antisepsis. The region was infiltrated locally with 1% lidocaine. Under real-time ultrasound guidance, the right brachial vein was accessed with a 21 gauge micropuncture needle; the needle tip within the vein was confirmed with ultrasound image documentation. Needle exchanged over a 018 guidewire for a peel-away sheath, through which a 5-French single-lumen power injectable PICC trimmed to 38cm was advanced, positioned with its tip near the cavoatrial junction. Spot chest radiograph confirms appropriate catheter position. Catheter was flushed per protocol and  secured externally with 0 Prolene suture. The patient tolerated procedure well.  COMPLICATIONS: COMPLICATIONS none  IMPRESSION: 1.  Technically successful five [place] single lumen power injectable PICC placement   Electronically Signed   By: Corlis Leak M.D.   On: 07/24/2015 15:34   Ir US Guide Vasc Access Right  07/24/2015   CLINICAL DATA:  Urinary tract infection, needs venous access for antibiotics  EXAM: PICC PLACEMENT WITH ULTRASOUND AND FLUOROSCOPY  FLUOROSCOPY TIME:  12 seconds  TECHNIQUE: After informed consent via telephone from family was obtained, patient was placed in the supine position on angiographic table. Patency of the right brachial vein was confirmed with ultrasound with image documentation. An appropriate skin site was determined. Skin site was marked. Region was prepped using maximum barrier technique including cap and mask, sterile gown, sterile gloves, large sterile sheet, and Chlorhexidine as cutaneous antisepsis. The region was infiltrated locally with 1% lidocaine. Under real-time ultrasound guidance, the right brachial vein was accessed with a 21 gauge micropuncture needle; the needle tip within the vein was confirmed with ultrasound image documentation. Needle exchanged over a 018 guidewire for a peel-away sheath, through which a 5-French single-lumen power injectable PICC trimmed to 38cm was advanced, positioned with its tip near the cavoatrial junction. Spot chest radiograph confirms appropriate catheter position. Catheter was flushed per protocol and secured externally with 0 Prolene suture. The patient tolerated procedure well.  COMPLICATIONS: COMPLICATIONS none  IMPRESSION: 1. Technically successful five [place] single lumen power injectable PICC placement   Electronically Signed   By: Corlis Leak M.D.   On: 07/24/2015 15:34   Dg Foot 2 Views Right  07/26/2015   CLINICAL DATA:  Right lateral heel region ulcer  EXAM: RIGHT FOOT - 2 VIEW  COMPARISON:  None.  FINDINGS: Two views of  the right foot submitted. No acute fracture or subluxation. Extensive osteopenia. Atherosclerotic vascular calcifications are noted. No bone destruction to suggest osteomyelitis.  IMPRESSION: Diffuse osteopenia. No acute fracture or subluxation. No definite evidence of osteomyelitis.   Electronically Signed   By: Natasha Mead M.D.   On: 07/26/2015 16:19     CBC  Recent Labs Lab 07/25/15 2227 07/26/15 0443  WBC 15.8* 16.1*  HGB 8.2* 8.3*  HCT 28.1* 29.0*  PLT 344 341  MCV 87.0 87.1  MCH 25.4* 24.9*  MCHC 29.2* 28.6*  RDW 17.5* 17.5*  LYMPHSABS 1.8  --   MONOABS 1.3*  --   EOSABS 0.6  --   BASOSABS 0.1  --     Chemistries   Recent Labs Lab 07/26/15 0443 07/26/15 1045 07/26/15 1807 07/27/15 0400 07/27/15 0644  NA 161* 158* 154* 154* 154*  K 3.6 3.7 3.6 3.4* 3.4*  CL >130* 128* 124* 125* 124*  CO2 GLUCOSE 144* 138* 156* 143* 138*  BUN 55* 53* 54* 53* 47*  CREATININE 3.41* 3.28* 3.20* 2.91* 3.10*  CALCIUM 9.6 9.6 9.5 9.8 9.8  AST 20  --   --   --   --   ALT 11*  --   --   --   --   ALKPHOS 71  --   --   --   --   BILITOT 0.2*  --   --   --   --    ------------------------------------------------------------------------------------------------------------------ estimated creatinine clearance is 15.1 mL/min (by C-G formula based on Cr of 3.1). ------------------------------------------------------------------------------------------------------------------ No results for input(s): HGBA1C in the last 72 hours. ------------------------------------------------------------------------------------------------------------------  Recent Labs  07/26/15 0443  CHOL 117  HDL 24*  LDLCALC 70  TRIG 409  CHOLHDL 4.9   ------------------------------------------------------------------------------------------------------------------  Recent Labs  07/26/15 1045  TSH 1.671    ------------------------------------------------------------------------------------------------------------------  Recent Labs  07/27/15 0644 07/27/15 0645  VITAMINB12 990*  --   FOLATE  --  24.4  FERRITIN 608*  --   TIBC NOT CALCULATED  --   IRON 28*  --   RETICCTPCT 0.8  --     Coagulation profile  Recent Labs Lab 07/26/15 0206  INR 1.33    No results for input(s): DDIMER in the last 72 hours.  Cardiac Enzymes No results for input(s): CKMB, TROPONINI, MYOGLOBIN in the last 168 hours.  Invalid input(s): CK ------------------------------------------------------------------------------------------------------------------ Invalid input(s): POCBNP     Time Spent in minutes   40 minutes   ELGERGAWY, DAWOOD M.D on 07/27/2015 at 3:14 PM  Between 7am to 7pm - Pager - [phone]  After 7pm go to [email or web address] - password [facility]  [facility]   Office  [phone]

## 2015-07-27 NOTE — Progress Notes (Addendum)
The patient is alert to self only and was easily awoken during the night.  His VS are stable.  Because of the reported lethargy during the day, he was put on the rounding list with rapid response.  [place] did round on him.  He was bathed this morning and given a CHG bath.  He is still coughing when he swallows meds with water and suction was set up at the bedside.  His HR remained in the upper 30s-mid 40s while asleep and rose to the 50s while he was awake.

## 2015-07-28 ENCOUNTER — Inpatient Hospital Stay (HOSPITAL_COMMUNITY): Payer: Medicare Other

## 2015-07-28 LAB — CBC
HCT: 26.6 % — ABNORMAL LOW (ref 39.0–52.0)
HEMOGLOBIN: 8 g/dL — AB (ref 13.0–17.0)
MCH: 25.5 pg — ABNORMAL LOW (ref 26.0–34.0)
MCHC: 30.1 g/dL (ref 30.0–36.0)
MCV: 84.7 fL (ref 78.0–100.0)
PLATELETS: 273 10*3/uL (ref 150–400)
RBC: 3.14 MIL/uL — AB (ref 4.22–5.81)
RDW: 17.3 % — ABNORMAL HIGH (ref 11.5–15.5)
WBC: 10.1 10*3/uL (ref 4.0–10.5)

## 2015-07-28 LAB — BASIC METABOLIC PANEL
Anion gap: 7 (ref 5–15)
BUN: 48 mg/dL — AB (ref 6–20)
CHLORIDE: 120 mmol/L — AB (ref 101–111)
CO2: 23 mmol/L (ref 22–32)
CREATININE: 2.87 mg/dL — AB (ref 0.61–1.24)
Calcium: 9.8 mg/dL (ref 8.9–10.3)
GFR, EST AFRICAN AMERICAN: 21 mL/min — AB (ref >60)
GFR, EST NON AFRICAN AMERICAN: 18 mL/min — AB (ref >60)
Glucose, Bld: 124 mg/dL — ABNORMAL HIGH (ref 65–99)
POTASSIUM: 3 mmol/L — AB (ref 3.5–5.1)
SODIUM: 150 mmol/L — AB (ref 135–145)

## 2015-07-28 LAB — GLUCOSE, CAPILLARY: Glucose-Capillary: 155 mg/dL — ABNORMAL HIGH (ref 65–99)

## 2015-07-28 LAB — SODIUM: Sodium: 147 mmol/L — ABNORMAL HIGH (ref 135–145)

## 2015-07-28 MED ORDER — DESMOPRESSIN ACE SPRAY REFRIG 0.01 % NA SOLN
1.0000 | Freq: Three times a day (TID) | NASAL | Status: DC
  Administered 2015-07-29 (×2): 1 via NASAL
  Filled 2015-07-28: qty 5

## 2015-07-28 MED ORDER — POTASSIUM CHLORIDE CRYS ER 20 MEQ PO TBCR
40.0000 meq | EXTENDED_RELEASE_TABLET | ORAL | Status: AC
  Administered 2015-07-28: 40 meq via ORAL
  Filled 2015-07-28 (×2): qty 2

## 2015-07-28 MED ORDER — NEPRO/CARBSTEADY PO LIQD
237.0000 mL | Freq: Two times a day (BID) | ORAL | Status: DC
  Administered 2015-07-28 (×2): 237 mL via ORAL

## 2015-07-28 NOTE — Progress Notes (Addendum)
Patient Demographics  Joseph Ponce, is a 81 y.o. male, DOB - [birth year], ZOX:[id number]  Admit date - 07/25/2015   Admitting Physician Lorretta Harp, MD  Outpatient Primary MD for the patient is [name], MD  LOS - 2   Chief Complaint  Patient presents with  . Abnormal Lab         Subjective:   [name] today denies any complaints of chest pain, shortness of breath, nausea or vomiting.  Assessment & Plan    Principal Problem:   Acute renal failure superimposed on stage 4 chronic kidney disease Active Problems:   Dementia without behavioral disturbance   Anemia   Renal artery stenosis   S/P BKA (below knee amputation) unilateral   Hypertension   Glaucoma (increased eye pressure)   COPD (chronic obstructive pulmonary disease)   Hyperlipemia   Osteoporosis   Chronic pain syndrome   Bradycardia, sinus   Depression   Hypernatremia   Dehydration   Infection of right foot   Decubitus ulcer of buttock, stage 3   Protein-calorie malnutrition  Acute on chronic renal failure - This is prerenal secondary to volume depletion as evident by his hyponatremia and physical exam. - Continue with IV fluid, patient had indwelling Foley catheter recently inserted at the nursing home.  Hypernatremia - Secondary to dehydration and volume depletion,hypernatremia is improving slowly, continue with D5W at 125 mL/h, monitor closely to avoid correction more than 12 mmol/L every 24Hours.  Acute encephalopathy - Patient with baseline dementia, with worsening mental status secondary to hypernatremia, as well as metabolic secondary to infection - Continues to improve  Right foot and sacral decubitus ulcer infection  - Patient initially on IV linezolid and Zosyn started at SNF  - Follow on blood cultures : No growth to date - Right foot x-ray with no evidence of osteomyelitis - We'll discontinue  IV linezolid, will start on IV vancomycin - Will check MRI of sacral spine to rule osteomyelitis,  Dementia without behavioral disturbance: -continue Memantine  Anemia: -  hgb dropped from 11.8 (this is after PRBC transfusion) on 04/12/14 to 8.2 on admission - check FOBT - Iron 28, ferritin 608, folate and B-12 within normal limit, workup significant for anemia of chronic illness  Hypertension: -Blood pressure acceptable, continue with hydralazine, hold amlodipine  Bradycardia - Symptomatic, continue telemetry monitor, no evidence of AV block on EKG.  Depression: -Continue home medications: mirtazapine  COPD (chronic obstructive pulmonary disease) - albuterol nbx prn  Hx of CVA: -ASA - Continue to hold Plavix, pending further workup.  Protein-calorie malnutrition-moderate: -Continue nutrition supplement  Hypokalemia - Repleted, recheck in a.m., check magnesium as well.  Code Status: DO NOT RESUSCITATE, discussed with POA [name] 9/18  Family Communication: spoke with [facility] 9/18  Disposition Plan: Back to SNF once stable    Procedures  None    Consults   None    Medications  Scheduled Meds: . antiseptic oral rinse  7 mL Mouth Rinse BID  . aspirin  81 mg Oral Daily  . brimonidine  1 drop Both Eyes BID  . calcium-vitamin D  1 tablet Oral BID WC  . Chlorhexidine Gluconate Cloth  6 each Topical Q0600  . collagenase   Topical Daily  .  desmopressin  1 drop Nasal TID  . dorzolamide  1 drop Both Eyes BID  . feeding supplement (NEPRO CARB STEADY)  237 mL Oral BID BM  . feeding supplement (PRO-STAT SUGAR FREE 64)  30 mL Oral BID  . gabapentin  100 mg Oral BID  . heparin  5,000 Units Subcutaneous 3 times per day  . hydrALAZINE  50 mg Oral 3 times per day  . hydroxypropyl methylcellulose / hypromellose  1 drop Both Eyes TID  . latanoprost  1 drop Both Eyes QHS  . memantine  28 mg Oral Daily  . mirtazapine  15 mg Oral QHS  . multivitamin  5 mL  Oral Daily  . mupirocin ointment  1 application Nasal BID  . nitroGLYCERIN  0.2 mg Transdermal Daily  . piperacillin-tazobactam (ZOSYN)  IV  2.25 g Intravenous 3 times per day  . saccharomyces boulardii  250 mg Oral BID  . senna  1 tablet Oral QHS  . sodium chloride  3 mL Intravenous Q12H  . vancomycin  1,000 mg Intravenous Q48H  . vitamin C  500 mg Oral BID  . zinc sulfate  220 mg Oral Daily   Continuous Infusions: . dextrose 125 mL/hr at 07/28/15 0626   PRN Meds:.acetaminophen, albuterol, HYDROcodone-acetaminophen, ondansetron **OR** ondansetron (ZOFRAN) IV, polyethylene glycol, RESOURCE THICKENUP CLEAR, sodium chloride  DVT Prophylaxis   Heparin -  Lab Results  Component Value Date   PLT 273 07/28/2015    Antibiotics    Anti-infectives    Start     Dose/Rate Route Frequency Ordered Stop   07/27/15 0900  vancomycin (VANCOCIN) IVPB 1000 mg/200 mL premix     1,000 mg 200 mL/hr over 60 Minutes Intravenous Every 48 hours 07/27/15 0830     07/26/15 0300  piperacillin-tazobactam (ZOSYN) IVPB 2.25 g     2.25 g 100 mL/hr over 30 Minutes Intravenous 3 times per day 07/26/15 0257     07/26/15 0200  piperacillin-tazobactam (ZOSYN) injection 2.25 g  Status:  Discontinued     2.25 g Intramuscular Every 8 hours 07/26/15 0052 07/26/15 0257   07/26/15 0200  linezolid (ZYVOX) tablet 600 mg  Status:  Discontinued     600 mg Oral 2 times daily 07/26/15 0052 07/27/15 0808          Objective:   Filed Vitals:   07/27/15 1722 07/27/15 2115 07/28/15 0622 07/28/15 0826  BP: 115/53 139/55 128/57 126/56  Pulse: 48 42 52 55  Temp: 98 F (36.7 C) 97.6 F (36.4 C) 97.5 F (36.4 C) 97.6 F (36.4 C)  TempSrc: Oral Oral Rectal Oral  Resp: 16 16  15   Height:      Weight:      SpO2: 100% 100% 100% 96%    Wt Readings from Last 3 Encounters:  07/26/15 72.576 kg (160 lb)  03/24/15 74.844 kg (165 lb)  02/24/15 75.751 kg (167 lb)     Intake/Output Summary (Last 24 hours) at 07/28/15  1300 Last data filed at 07/28/15 0926  Gross per 24 hour  Intake 1502.92 ml  Output    450 ml  Net 1052.92 ml     Physical Exam more awake and coherent today. Supple Neck,No JVD,  Symmetrical Chest wall movement, Good air movement bilaterally,  RRR,No Gallops,Rubs or new Murmurs,  +ve B.Sounds, Abd Soft, No tenderness, No organomegaly appriciated,  left BKA, right heel pressure ulcer, stage III decubitus sacral ulcer with minimal discharge   Data Review   Micro Results Recent Results (  from the past 240 hour(s))  Culture, blood (x 2)     Status: None (Preliminary result)   Collection Time: 07/26/15  1:59 AM  Result Value Ref Range Status   Specimen Description BLOOD RIGHT HAND  Final   Special Requests IN PEDIATRIC BOTTLE 3CC  Final   Culture NO GROWTH 1 DAY  Final   Report Status PENDING  Incomplete  MRSA PCR Screening     Status: Abnormal   Collection Time: 07/26/15  2:02 AM  Result Value Ref Range Status   MRSA by PCR POSITIVE (A) NEGATIVE Final    Comment:        The GeneXpert MRSA Assay (FDA approved for NASAL specimens only), is one component of a comprehensive MRSA colonization surveillance program. It is not intended to diagnose MRSA infection nor to guide or monitor treatment for MRSA infections. RESULT CALLED TO, READ BACK BY AND VERIFIED WITH: ZEE MAHMOUD@[id number]  07/26/15 MKELLY   Culture, blood (x 2)     Status: None (Preliminary result)   Collection Time: 07/26/15  2:06 AM  Result Value Ref Range Status   Specimen Description BLOOD LEFT HAND  Final   Special Requests IN PEDIATRIC BOTTLE 3CC  Final   Culture NO GROWTH 1 DAY  Final   Report Status PENDING  Incomplete    Radiology Reports Ct Lumbar Spine Wo Contrast  07/27/2015   CLINICAL DATA:  Extreme back pain, in addition to pressure ulcer. History of dementia, chronic kidney disease, below-knee amputation.  EXAM: CT LUMBAR SPINE WITHOUT CONTRAST  TECHNIQUE: Multidetector CT imaging of the lumbar  spine was performed without intravenous contrast administration. Multiplanar CT image reconstructions were also generated.  COMPARISON:  None.  FINDINGS: Lumbar vertebral bodies and posterior elements are intact and aligned and maintenance of lumbar lordosis. Using the reference level of the last well-formed intervertebral disc as L5-S1, moderate L2-3 through L4-5 disc height loss, intradiscal calcification. Vacuum disc at L3-4. Probable auto body intern arthrodesis at L2-3. Mild endplate spurring Z3-0 through L4-5. Mild osteopenia without destructive bony lesions. Small probable enchondroma RIGHT iliac bone. Status post bilateral hip total arthroplasties.  Partially imaged small pleural effusions. 3.3 cm LEFT renal cyst. Vascular calcifications versus nonobstructing RIGHT nephrolithiasis. Large amount of stool distending the sigmoid colon and rectum.  Small broad-based disc osteophyte complex at L2-3 results in mild canal stenosis. Mild to moderate RIGHT L4-5 and L5-S1 osseous neural foraminal narrowing.  IMPRESSION: No acute lumbar spine fracture, malalignment.  Mild canal stenosis at L2-3. Mild to moderate RIGHT L4-5 and L5-S1 neural foraminal narrowing.   Electronically Signed   By: Awilda Metro M.D.   On: 07/27/2015 23:00   US Renal  07/26/2015   CLINICAL DATA:  81 year old male with acute renal insufficiency.  EXAM: RENAL / URINARY TRACT ULTRASOUND COMPLETE  COMPARISON:  None.  FINDINGS: Right Kidney:  Length: 11 cm. Diffuse increased renal echogenicity compatible with underlying medical renal disease. Clinical correlation is recommended. A 2.2 x 1.5 x 1.9 cm upper pole cyst is seen. There is no hydronephrosis. Evaluation for stone or underlying solid lesion is limited due to increased echogenicity.  Left Kidney:  Length: 11 cm. There are multiple cysts in the left kidney measuring up to 4.1 x 4.4 x 3.9 cm in the upper pole. There is diffuse increased renal parenchymal echogenicity. No hydronephrosis.   Bladder:  A Foley catheter is noted within the partially distended urinary bladder.  IMPRESSION: Increased bilateral renal echogenicity likely related side and aligned medical renal  disease. Clinical correlation is recommended.  No hydronephrosis.  Bilateral renal cysts.   Electronically Signed   By: Elgie Collard M.D.   On: 07/26/2015 04:27   Ir Fluoro Guide Cv Line Right  07/24/2015   CLINICAL DATA:  Urinary tract infection, needs venous access for antibiotics  EXAM: PICC PLACEMENT WITH ULTRASOUND AND FLUOROSCOPY  FLUOROSCOPY TIME:  12 seconds  TECHNIQUE: After informed consent via telephone from family was obtained, patient was placed in the supine position on angiographic table. Patency of the right brachial vein was confirmed with ultrasound with image documentation. An appropriate skin site was determined. Skin site was marked. Region was prepped using maximum barrier technique including cap and mask, sterile gown, sterile gloves, large sterile sheet, and Chlorhexidine as cutaneous antisepsis. The region was infiltrated locally with 1% lidocaine. Under real-time ultrasound guidance, the right brachial vein was accessed with a 21 gauge micropuncture needle; the needle tip within the vein was confirmed with ultrasound image documentation. Needle exchanged over a 018 guidewire for a peel-away sheath, through which a 5-French single-lumen power injectable PICC trimmed to 38cm was advanced, positioned with its tip near the cavoatrial junction. Spot chest radiograph confirms appropriate catheter position. Catheter was flushed per protocol and secured externally with 0 Prolene suture. The patient tolerated procedure well.  COMPLICATIONS: COMPLICATIONS none  IMPRESSION: 1. Technically successful five [place] single lumen power injectable PICC placement   Electronically Signed   By: Corlis Leak M.D.   On: 07/24/2015 15:34   Ir US Guide Vasc Access Right  07/24/2015   CLINICAL DATA:  Urinary tract infection,  needs venous access for antibiotics  EXAM: PICC PLACEMENT WITH ULTRASOUND AND FLUOROSCOPY  FLUOROSCOPY TIME:  12 seconds  TECHNIQUE: After informed consent via telephone from family was obtained, patient was placed in the supine position on angiographic table. Patency of the right brachial vein was confirmed with ultrasound with image documentation. An appropriate skin site was determined. Skin site was marked. Region was prepped using maximum barrier technique including cap and mask, sterile gown, sterile gloves, large sterile sheet, and Chlorhexidine as cutaneous antisepsis. The region was infiltrated locally with 1% lidocaine. Under real-time ultrasound guidance, the right brachial vein was accessed with a 21 gauge micropuncture needle; the needle tip within the vein was confirmed with ultrasound image documentation. Needle exchanged over a 018 guidewire for a peel-away sheath, through which a 5-French single-lumen power injectable PICC trimmed to 38cm was advanced, positioned with its tip near the cavoatrial junction. Spot chest radiograph confirms appropriate catheter position. Catheter was flushed per protocol and secured externally with 0 Prolene suture. The patient tolerated procedure well.  COMPLICATIONS: COMPLICATIONS none  IMPRESSION: 1. Technically successful five [place] single lumen power injectable PICC placement   Electronically Signed   By: Corlis Leak M.D.   On: 07/24/2015 15:34   Dg Foot 2 Views Right  07/26/2015   CLINICAL DATA:  Right lateral heel region ulcer  EXAM: RIGHT FOOT - 2 VIEW  COMPARISON:  None.  FINDINGS: Two views of the right foot submitted. No acute fracture or subluxation. Extensive osteopenia. Atherosclerotic vascular calcifications are noted. No bone destruction to suggest osteomyelitis.  IMPRESSION: Diffuse osteopenia. No acute fracture or subluxation. No definite evidence of osteomyelitis.   Electronically Signed   By: Natasha Mead M.D.   On: 07/26/2015 16:19      CBC  Recent Labs Lab 07/25/15 2227 07/26/15 0443 07/28/15 0700  WBC 15.8* 16.1* 10.1  HGB 8.2* 8.3* 8.0*  HCT  28.1* 29.0* 26.6*  PLT 344 341 273  MCV 87.0 87.1 84.7  MCH 25.4* 24.9* 25.5*  MCHC 29.2* 28.6* 30.1  RDW 17.5* 17.5* 17.3*  LYMPHSABS 1.8  --   --   MONOABS 1.3*  --   --   EOSABS 0.6  --   --   BASOSABS 0.1  --   --     Chemistries   Recent Labs Lab 07/26/15 0443 07/26/15 1045 07/26/15 1807 07/27/15 0400 07/27/15 0644 07/27/15 1650 07/28/15 0700  NA 161* 158* 154* 154* 154* 149* 150*  K 3.6 3.7 3.6 3.4* 3.4*  --  3.0*  CL >130* 128* 124* 125* 124*  --  120*  CO2 24 23 24 23 23   --  23  GLUCOSE 144* 138* 156* 143* 138*  --  124*  BUN 55* 53* 54* 53* 47*  --  48*  CREATININE 3.41* 3.28* 3.20* 2.91* 3.10*  --  2.87*  CALCIUM 9.6 9.6 9.5 9.8 9.8  --  9.8  AST 20  --   --   --   --   --   --   ALT 11*  --   --   --   --   --   --   ALKPHOS 71  --   --   --   --   --   --   BILITOT 0.2*  --   --   --   --   --   --    ------------------------------------------------------------------------------------------------------------------ estimated creatinine clearance is 16.3 mL/min (by C-G formula based on Cr of 2.87). ------------------------------------------------------------------------------------------------------------------ No results for input(s): HGBA1C in the last 72 hours. ------------------------------------------------------------------------------------------------------------------  Recent Labs  07/26/15 0443  CHOL 117  HDL 24*  LDLCALC 70  TRIG 161  CHOLHDL 4.9   ------------------------------------------------------------------------------------------------------------------  Recent Labs  07/26/15 1045  TSH 1.671   ------------------------------------------------------------------------------------------------------------------  Recent Labs  07/27/15 0644 07/27/15 0645  VITAMINB12 990*  --   FOLATE  --  24.4  FERRITIN 608*   --   TIBC NOT CALCULATED  --   IRON 28*  --   RETICCTPCT 0.8  --     Coagulation profile  Recent Labs Lab 07/26/15 0206  INR 1.33    No results for input(s): DDIMER in the last 72 hours.  Cardiac Enzymes No results for input(s): CKMB, TROPONINI, MYOGLOBIN in the last 168 hours.  Invalid input(s): CK ------------------------------------------------------------------------------------------------------------------ Invalid input(s): POCBNP     Time Spent in minutes   35 minutes   ELGERGAWY, DAWOOD M.D on 07/28/2015 at 1:00 PM  Between 7am to 7pm - Pager - [phone]  After 7pm go to [email or web address] - password [facility]  [facility]   Office  [phone]

## 2015-07-28 NOTE — Progress Notes (Signed)
07/28/2015 5:43 PM  Went in to administer afternoon/evening medications to patient.  At this time, pt is very drowsy.  Responds to sternal rub and to attempts at repositioning, however, dozes back off when you begin talking to him or asking him to take his meds.  At this time, this RN is unable to safely administer PO medications due to his drowsiness.  Dr. Randol Kern notified.  Will monitor. Theadora Rama

## 2015-07-28 NOTE — Progress Notes (Addendum)
Initial Nutrition Assessment  DOCUMENTATION CODES:   Not applicable  INTERVENTION:   -D/c Boost Breeze po BID, each supplement provides 250 kcal and 9 grams of protein -Nepro Shake po BID, each supplement provides 425 kcal and 19 grams protein (due to nectar thick liquids) -Continue 30 ml Prostat BID -Continue MVI, Zinc, and Vitamin C  NUTRITION DIAGNOSIS:   Increased nutrient needs related to wound healing, chronic illness as evidenced by estimated needs.  GOAL:   Patient will meet greater than or equal to 90% of their needs  MONITOR:   PO intake, Supplement acceptance, Labs, Weight trends, Skin, I & O's  REASON FOR ASSESSMENT:   Low Braden    ASSESSMENT:   Joseph Ponce is a 81 y.o. male with PMH of dementia, hypertension, hyperlipidemia, depression, osteoporosis, stroke, dysphasia, PVD, COPD, renal artery stenosis,CKD-IV, who presents abnormal lab.  Pt admitted from [facility] SNF with dx of ARF superimposed on stage IV CKD.   Pt laying in bed with air mattress at time of visit. He reports no changes in his appetite or weight loss (he states "I eat 3 meals per day"). He reports he ate his fruit at breakfast, however, noted a bowl of peaches on bedside table that were unattempted. Meal completion is 0-50% on a dysphagia 1 diet with nectar thick liquids.   Reviewed wt hx. Noted wt stability for > 1 year. Wt ranges between 160-170#.   Reviewed [facility] note from 07/26/15. Pt has unstageable full thickness wounds on rt foot and sacrum. PTA, he was receiving IV antibiotics for wound infection; currently receiving IV vancomycin.   Pt is receiving Boost Breeze BID and Prostat BID. Also noted Orders for Remeron, Vitamin C, Zinc, Oscal with Vitamin D, and MVI.   Nutrition-Focused physical exam completed. Findings are mild fat depletion, mild muscle depletion, and mild edema. Suspect fat and muscle depletion are related to advanced age and limited mobility.  Labs reviewed: Na: 150,  K: 3.0.   Diet Order:  DIET - DYS 1 Room service appropriate?: Yes; Fluid consistency:: Nectar Thick  Skin:  Wound (see comment) (Unstageable wound rt foot and sacrum)  Last BM:  07/27/15  Height:   Ht Readings from Last 1 Encounters:  07/26/15  (1.702 m)    Weight:   Wt Readings from Last 1 Encounters:  07/26/15 160 lb (72.576 kg)    Ideal Body Weight:  62.9 kg (adjusted for amputations)  BMI:  Body mass index is 25.05 kg/(m^2).  Adjusted BMI: 26.8.   Estimated Nutritional Needs:   Kcal:  2000-2200  Protein:  95-100 grams  Fluid:  per MD  EDUCATION NEEDS:   Education needs no appropriate at this time  Jenifer A. Mayford Knife, RD, LDN, CDE Pager: [phone] After hours Pager: [phone]

## 2015-07-28 NOTE — Progress Notes (Signed)
Speech Language Pathology Treatment: Dysphagia  Patient Details Name: Joseph Ponce MRN: [id number] DOB: 1924-05-15 Today's Date: 07/28/2015 Time: 1040-1050 SLP Time Calculation (min) (ACUTE ONLY): 10 min  Assessment / Plan / Recommendation Clinical Impression  Pt does well with current textures, with swift oral phase and no overt indications of aspiration. SLP provided advanced trials fo Dys 2 textures, with which pt required Mod multimodal cueing to address prolonged mastication and buccal pocketing. Pt eventually expectorated a piece of partially unmasticated cracker, and SLP provided additional bites of puree alone to address oral clearance. Recommend to continue with current diet with SLP f/u to determine if advancement would be appropriate.   HPI Other Pertinent Information: 81 y.o. male with PMH of dementia, hypertension, hyperlipidemia, depression, osteoporosis, stroke, dysphasia, PVD, COPD, BKA, renal artery stenosis,CKD-IV, who presents abnormal lab from SNF. No recent CXR. Found to have acute renal failure superimposed on stage 4 chronic kidney disease. MBS x 2 in 2007, results unavailable (MBS in imaging for 06/2015- no results found).   Pertinent Vitals Pain Assessment: No/denies pain  SLP Plan  Continue with current plan of care    Recommendations Diet recommendations: Dysphagia 1 (puree);Nectar-thick liquid Liquids provided via: Cup;Straw Medication Administration: Crushed with puree Supervision: Patient able to self feed;Full supervision/cueing for compensatory strategies Compensations: Slow rate;Small sips/bites;Check for pocketing Postural Changes and/or Swallow Maneuvers: Seated upright 90 degrees;Upright 30-60 min after meal       Oral Care Recommendations: Oral care BID Follow up Recommendations: Skilled Nursing facility Plan: Continue with current plan of care    Maxcine Ham, M.A. CCC-SLP [phone]  Maxcine Ham 07/28/2015, 11:07 AM

## 2015-07-28 NOTE — Clinical Social Work Note (Signed)
Clinical Social Work Assessment  Patient Details  Name: Joseph Ponce MRN: [id number] Date of Birth: 18-Jun-1924  Date of referral:  07/28/15               Reason for consult:  Facility Placement (from [facility])                Permission sought to share information with:  Case Manager, Facility Medical sales representative, Family Supports Permission granted to share information::  Yes, Verbal Permission Granted  Name::        Agency::  [facility]  Relationship::  Neice:  Joseph Ponce  [phone]  Contact Information:     Housing/Transportation Living arrangements for the past 2 months:  Skilled Nursing Facility Source of Information:  Medical Team, Facility, Other (Comment Required) (patient neice via phone) Patient Interpreter Needed:  None Criminal Activity/Legal Involvement Pertinent to Current Situation/Hospitalization:  No - Comment as needed Significant Relationships:  Other Family Members Lives with:  Facility Resident Do you feel safe going back to the place where you live?  Yes Need for family participation in patient care:  Yes (Comment) (due to Dementia)  Care giving concerns:  Spoke with patient niece via phone per concerns and nothing was reported. Reports she lives out of town and is working, but is available by phone for information/decision making needs.  Reports patient is a LTC patient at [facility] and plans for him to return.     Social Worker assessment / plan:  LCSW spoke with Joseph Ponce to obtain working phone number for niece and also discuss plan of care with SW Joseph Ponce.  Patient welcomed to return, he is a long term care patient and requested updated FL2 and clinicals. Patient's niece called, active with care and knowledge of patient at SNF. NO concerns at this time. Agreeable for plan to return once medically stable.  Employment status:  Retired Health and safety inspector:  Armed forces operational officer, [employer] PT Recommendations:  Skilled Nursing  Facility Information / Referral to community resources:  Skilled Nursing Facility  Patient/Family's Response to care:  Agreeable  Patient/Family's Understanding of and Emotional Response to Diagnosis, Current Treatment, and Prognosis:  Daughter concerned with patient diet and admission being he is not eating because he cannot feed himself or not getting pureed foods. Updated daughter with consults from [facility], nutrition and speech therapy.  Daughter reports relief that these areas of concern are being addressed.  Emotional Assessment Appearance:  Appears stated age Attitude/Demeanor/Rapport:  Unresponsive (unable to particiipate due to Dementia/lethargic) Affect (typically observed):  Quiet Orientation:  Oriented to Self Alcohol / Substance use:  Not Applicable Psych involvement (Current and /or in the community):  No (Comment)  Discharge Needs  Concerns to be addressed:  Denies Needs/Concerns at this time Readmission within the last 30 days:  No Current discharge risk:  None Barriers to Discharge:  No Barriers Identified, Continued Medical Work up   Joseph Sorrow, LCSW 07/28/2015, 11:28 AM

## 2015-07-28 NOTE — Progress Notes (Signed)
OT Cancellation Note  Patient Details Name: Joseph Ponce MRN: [id number] DOB: [birth year]   Cancelled Treatment:    Reason Eval/Treat Not Completed: OT screened, no needs identified, will sign off. Pt from SNF and returning SNF.  [name] 07/28/2015, 10:16 AM

## 2015-07-29 LAB — BASIC METABOLIC PANEL
Anion gap: 6 (ref 5–15)
BUN: 44 mg/dL — AB (ref 6–20)
CALCIUM: 9.3 mg/dL (ref 8.9–10.3)
CO2: 24 mmol/L (ref 22–32)
CREATININE: 2.8 mg/dL — AB (ref 0.61–1.24)
Chloride: 115 mmol/L — ABNORMAL HIGH (ref 101–111)
GFR calc Af Amer: 22 mL/min — ABNORMAL LOW (ref >60)
GFR, EST NON AFRICAN AMERICAN: 19 mL/min — AB (ref >60)
GLUCOSE: 119 mg/dL — AB (ref 65–99)
Potassium: 3.3 mmol/L — ABNORMAL LOW (ref 3.5–5.1)
SODIUM: 145 mmol/L (ref 135–145)

## 2015-07-29 LAB — PHOSPHORUS: PHOSPHORUS: 3.7 mg/dL (ref 2.5–4.6)

## 2015-07-29 LAB — CBC
HCT: 24.4 % — ABNORMAL LOW (ref 39.0–52.0)
Hemoglobin: 7.3 g/dL — ABNORMAL LOW (ref 13.0–17.0)
MCH: 25.3 pg — ABNORMAL LOW (ref 26.0–34.0)
MCHC: 29.9 g/dL — AB (ref 30.0–36.0)
MCV: 84.4 fL (ref 78.0–100.0)
PLATELETS: 270 10*3/uL (ref 150–400)
RBC: 2.89 MIL/uL — ABNORMAL LOW (ref 4.22–5.81)
RDW: 17.2 % — AB (ref 11.5–15.5)
WBC: 10 10*3/uL (ref 4.0–10.5)

## 2015-07-29 LAB — PREPARE RBC (CROSSMATCH)

## 2015-07-29 LAB — GLUCOSE, CAPILLARY: GLUCOSE-CAPILLARY: 105 mg/dL — AB (ref 65–99)

## 2015-07-29 LAB — MAGNESIUM: MAGNESIUM: 1.9 mg/dL (ref 1.7–2.4)

## 2015-07-29 LAB — OCCULT BLOOD X 1 CARD TO LAB, STOOL: Fecal Occult Bld: NEGATIVE

## 2015-07-29 MED ORDER — DEXTROSE-NACL 5-0.45 % IV SOLN
INTRAVENOUS | Status: AC
  Administered 2015-07-29: 08:00:00 via INTRAVENOUS

## 2015-07-29 MED ORDER — SODIUM CHLORIDE 0.9 % IV SOLN: Freq: Once | INTRAVENOUS | Status: DC

## 2015-07-29 NOTE — Progress Notes (Signed)
Patient Demographics  Joseph Ponce, is a 81 y.o. male, DOB - 01-23-1924, [id number]  Admit date - 07/25/2015   Admitting Physician Lorretta Harp, MD  Outpatient Primary MD for the patient is [name], MD  LOS - 3   Chief Complaint  Patient presents with  . Abnormal Lab         Subjective:   Joseph Ponce today denies any complaints of chest pain, shortness of breath, nausea or vomiting.  Assessment & Plan    Principal Problem:   Acute renal failure superimposed on stage 4 chronic kidney disease Active Problems:   Dementia without behavioral disturbance   Anemia   Renal artery stenosis   S/P BKA (below knee amputation) unilateral   Hypertension   Glaucoma (increased eye pressure)   COPD (chronic obstructive pulmonary disease)   Hyperlipemia   Osteoporosis   Chronic pain syndrome   Bradycardia, sinus   Depression   Hypernatremia   Dehydration   Infection of right foot   Decubitus ulcer of buttock, stage 3   Protein-calorie malnutrition  Acute on chronic renal failure - This is prerenal secondary to volume depletion as evident by his hyponatremia and physical exam. - Continue with IV fluid, patient had indwelling Foley catheter recently inserted at the nursing home. - improving with IV fluids , Slight creatinine 1.7-2, peaked at 3.4, today's 2.8  Hypernatremia - Secondary to dehydration and volume depletion,hypernatremia is improving slowly,initially on  D5W at 125 mL/h,  sodium improved to 145 today , will change IV fluid to D5 half-normal saline at 75 mL per hour , monitor closely to avoid correction more than 12 mmol/L every 24Hours.  Acute encephalopathy - Patient with baseline dementia, with worsening mental status secondary to hypernatremia, as well as metabolic secondary to infection - Continues to improve  Right foot and sacral decubitus ulcer infection  -  Patient initially on IV linezolid and Zosyn started at SNF  - Follow on blood cultures : No growth to date - Right foot x-ray with no evidence of osteomyelitis - We'll discontinue IV linezolid, will start on IV vancomycin -  MRI of sacral spine to rule osteomyelitis was incomplete, will finish remaining part today.  Dementia without behavioral disturbance: -continue Memantine  Anemia: -  hgb dropped from 11.8 (this is after PRBC transfusion) on 04/12/14 to 8.2 on admission, dropped today to 7.3, and this is most likely dilutional effect as an IV fluids, no evidence of GI bleed, FOBT negative, will transfuse 1 unit PRBC today.  -  FOBT negative - Iron 28, ferritin 608, folate and B-12 within normal limit, workup significant for anemia of chronic illness, and renal insufficiency .  Hypertension: -Blood pressure acceptable, continue with hydralazine, continue to  hold amlodipine  Bradycardia - Symptomatic, continue telemetry monitor, no evidence of AV block on EKG.  Depression: -Continue home medications: mirtazapine  COPD (chronic obstructive pulmonary disease) - albuterol nbx prn  Hx of CVA: -ASA - Continue to hold Plavix,given his anemia  protien-calorie malnutrition-moderate: -Continue nutrition supplement  Hypokalemia - Repleted, recheck in a.m., check magnesium as well.  Code Status: DO NOT RESUSCITATE, discussed with POA [name] 9/18  Family Communication: spoke with [facility] 9/18  Disposition Plan: Back to SNF once  stable    Procedures  None    Consults   None    Medications  Scheduled Meds: . sodium chloride   Intravenous Once  . antiseptic oral rinse  7 mL Mouth Rinse BID  . aspirin  81 mg Oral Daily  . brimonidine  1 drop Both Eyes BID  . calcium-vitamin D  1 tablet Oral BID WC  . Chlorhexidine Gluconate Cloth  6 each Topical Q0600  . collagenase   Topical Daily  . desmopressin  1 spray Nasal TID  . dorzolamide  1 drop Both Eyes BID    . feeding supplement (NEPRO CARB STEADY)  237 mL Oral BID BM  . feeding supplement (PRO-STAT SUGAR FREE 64)  30 mL Oral BID  . gabapentin  100 mg Oral BID  . heparin  5,000 Units Subcutaneous 3 times per day  . hydrALAZINE  50 mg Oral 3 times per day  . hydroxypropyl methylcellulose / hypromellose  1 drop Both Eyes TID  . latanoprost  1 drop Both Eyes QHS  . memantine  28 mg Oral Daily  . mirtazapine  15 mg Oral QHS  . multivitamin  5 mL Oral Daily  . mupirocin ointment  1 application Nasal BID  . nitroGLYCERIN  0.2 mg Transdermal Daily  . piperacillin-tazobactam (ZOSYN)  IV  2.25 g Intravenous 3 times per day  . saccharomyces boulardii  250 mg Oral BID  . senna  1 tablet Oral QHS  . sodium chloride  3 mL Intravenous Q12H  . vancomycin  1,000 mg Intravenous Q48H  . vitamin C  500 mg Oral BID  . zinc sulfate  220 mg Oral Daily   Continuous Infusions: . dextrose 5 % and 0.45% NaCl 75 mL/hr at 07/29/15 0804   PRN Meds:.acetaminophen, albuterol, HYDROcodone-acetaminophen, ondansetron **OR** ondansetron (ZOFRAN) IV, polyethylene glycol, RESOURCE THICKENUP CLEAR, sodium chloride  DVT Prophylaxis   Heparin -  Lab Results  Component Value Date   PLT 270 07/29/2015    Antibiotics    Anti-infectives    Start     Dose/Rate Route Frequency Ordered Stop   07/27/15 0900  vancomycin (VANCOCIN) IVPB 1000 mg/200 mL premix     1,000 mg 200 mL/hr over 60 Minutes Intravenous Every 48 hours 07/27/15 0830     07/26/15 0300  piperacillin-tazobactam (ZOSYN) IVPB 2.25 g     2.25 g 100 mL/hr over 30 Minutes Intravenous 3 times per day 07/26/15 0257     07/26/15 0200  piperacillin-tazobactam (ZOSYN) injection 2.25 g  Status:  Discontinued     2.25 g Intramuscular Every 8 hours 07/26/15 0052 07/26/15 0257   07/26/15 0200  linezolid (ZYVOX) tablet 600 mg  Status:  Discontinued     600 mg Oral 2 times daily 07/26/15 0052 07/27/15 0808          Objective:   Filed Vitals:   07/28/15 1550  07/29/15 0005 07/29/15 0455 07/29/15 0828  BP: 103/55 153/63 135/52 130/74  Pulse: 52 56 50 47  Temp: 98.2 F (36.8 C)  98 F (36.7 C)   TempSrc: Oral  Oral   Resp: 13 14 15 13   Height:      Weight:      SpO2: 96% 100% 100% 99%    Wt Readings from Last 3 Encounters:  07/26/15 72.576 kg (160 lb)  03/24/15 74.844 kg (165 lb)  02/24/15 75.751 kg (167 lb)     Intake/Output Summary (Last 24 hours) at 07/29/15 1623 Last data filed at 07/29/15  0600  Gross per 24 hour  Intake    580 ml  Output    100 ml  Net    480 ml     Physical Exam more awake and coherent today. Supple Neck,No JVD,  Symmetrical Chest wall movement, Good air movement bilaterally,  RRR,No Gallops,Rubs or new Murmurs,  +ve B.Sounds, Abd Soft, No tenderness, No organomegaly appriciated,  left BKA, right heel pressure ulcer, stage III decubitus sacral ulcer with minimal discharge   Data Review   Micro Results Recent Results (from the past 240 hour(s))  Culture, blood (x 2)     Status: None (Preliminary result)   Collection Time: 07/26/15  1:59 AM  Result Value Ref Range Status   Specimen Description BLOOD RIGHT HAND  Final   Special Requests IN PEDIATRIC BOTTLE 3CC  Final   Culture NO GROWTH 3 DAYS  Final   Report Status PENDING  Incomplete  MRSA PCR Screening     Status: Abnormal   Collection Time: 07/26/15  2:02 AM  Result Value Ref Range Status   MRSA by PCR POSITIVE (A) NEGATIVE Final    Comment:        The GeneXpert MRSA Assay (FDA approved for NASAL specimens only), is one component of a comprehensive MRSA colonization surveillance program. It is not intended to diagnose MRSA infection nor to guide or monitor treatment for MRSA infections. RESULT CALLED TO, READ BACK BY AND VERIFIED WITH: ZEE MAHMOUD@[id number]  07/26/15 MKELLY   Culture, blood (x 2)     Status: None (Preliminary result)   Collection Time: 07/26/15  2:06 AM  Result Value Ref Range Status   Specimen Description BLOOD LEFT  HAND  Final   Special Requests IN PEDIATRIC BOTTLE 3CC  Final   Culture NO GROWTH 3 DAYS  Final   Report Status PENDING  Incomplete    Radiology Reports Ct Lumbar Spine Wo Contrast  07/27/2015   CLINICAL DATA:  Extreme back pain, in addition to pressure ulcer. History of dementia, chronic kidney disease, below-knee amputation.  EXAM: CT LUMBAR SPINE WITHOUT CONTRAST  TECHNIQUE: Multidetector CT imaging of the lumbar spine was performed without intravenous contrast administration. Multiplanar CT image reconstructions were also generated.  COMPARISON:  None.  FINDINGS: Lumbar vertebral bodies and posterior elements are intact and aligned and maintenance of lumbar lordosis. Using the reference level of the last well-formed intervertebral disc as L5-S1, moderate L2-3 through L4-5 disc height loss, intradiscal calcification. Vacuum disc at L3-4. Probable auto body intern arthrodesis at L2-3. Mild endplate spurring W0-9 through L4-5. Mild osteopenia without destructive bony lesions. Small probable enchondroma RIGHT iliac bone. Status post bilateral hip total arthroplasties.  Partially imaged small pleural effusions. 3.3 cm LEFT renal cyst. Vascular calcifications versus nonobstructing RIGHT nephrolithiasis. Large amount of stool distending the sigmoid colon and rectum.  Small broad-based disc osteophyte complex at L2-3 results in mild canal stenosis. Mild to moderate RIGHT L4-5 and L5-S1 osseous neural foraminal narrowing.  IMPRESSION: No acute lumbar spine fracture, malalignment.  Mild canal stenosis at L2-3. Mild to moderate RIGHT L4-5 and L5-S1 neural foraminal narrowing.   Electronically Signed   By: Awilda Metro M.D.   On: 07/27/2015 23:00   US Renal  07/26/2015   CLINICAL DATA:  81 year old male with acute renal insufficiency.  EXAM: RENAL / URINARY TRACT ULTRASOUND COMPLETE  COMPARISON:  None.  FINDINGS: Right Kidney:  Length: 11 cm. Diffuse increased renal echogenicity compatible with underlying  medical renal disease. Clinical correlation is recommended. A 2.2  x 1.5 x 1.9 cm upper pole cyst is seen. There is no hydronephrosis. Evaluation for stone or underlying solid lesion is limited due to increased echogenicity.  Left Kidney:  Length: 11 cm. There are multiple cysts in the left kidney measuring up to 4.1 x 4.4 x 3.9 cm in the upper pole. There is diffuse increased renal parenchymal echogenicity. No hydronephrosis.  Bladder:  A Foley catheter is noted within the partially distended urinary bladder.  IMPRESSION: Increased bilateral renal echogenicity likely related side and aligned medical renal disease. Clinical correlation is recommended.  No hydronephrosis.  Bilateral renal cysts.   Electronically Signed   By: Elgie Collard M.D.   On: 07/26/2015 04:27   Ir Fluoro Guide Cv Line Right  07/24/2015   CLINICAL DATA:  Urinary tract infection, needs venous access for antibiotics  EXAM: PICC PLACEMENT WITH ULTRASOUND AND FLUOROSCOPY  FLUOROSCOPY TIME:  12 seconds  TECHNIQUE: After informed consent via telephone from family was obtained, patient was placed in the supine position on angiographic table. Patency of the right brachial vein was confirmed with ultrasound with image documentation. An appropriate skin site was determined. Skin site was marked. Region was prepped using maximum barrier technique including cap and mask, sterile gown, sterile gloves, large sterile sheet, and Chlorhexidine as cutaneous antisepsis. The region was infiltrated locally with 1% lidocaine. Under real-time ultrasound guidance, the right brachial vein was accessed with a 21 gauge micropuncture needle; the needle tip within the vein was confirmed with ultrasound image documentation. Needle exchanged over a 018 guidewire for a peel-away sheath, through which a 5-French single-lumen power injectable PICC trimmed to 38cm was advanced, positioned with its tip near the cavoatrial junction. Spot chest radiograph confirms appropriate  catheter position. Catheter was flushed per protocol and secured externally with 0 Prolene suture. The patient tolerated procedure well.  COMPLICATIONS: COMPLICATIONS none  IMPRESSION: 1. Technically successful five [place] single lumen power injectable PICC placement   Electronically Signed   By: Corlis Leak M.D.   On: 07/24/2015 15:34   Ir US Guide Vasc Access Right  07/24/2015   CLINICAL DATA:  Urinary tract infection, needs venous access for antibiotics  EXAM: PICC PLACEMENT WITH ULTRASOUND AND FLUOROSCOPY  FLUOROSCOPY TIME:  12 seconds  TECHNIQUE: After informed consent via telephone from family was obtained, patient was placed in the supine position on angiographic table. Patency of the right brachial vein was confirmed with ultrasound with image documentation. An appropriate skin site was determined. Skin site was marked. Region was prepped using maximum barrier technique including cap and mask, sterile gown, sterile gloves, large sterile sheet, and Chlorhexidine as cutaneous antisepsis. The region was infiltrated locally with 1% lidocaine. Under real-time ultrasound guidance, the right brachial vein was accessed with a 21 gauge micropuncture needle; the needle tip within the vein was confirmed with ultrasound image documentation. Needle exchanged over a 018 guidewire for a peel-away sheath, through which a 5-French single-lumen power injectable PICC trimmed to 38cm was advanced, positioned with its tip near the cavoatrial junction. Spot chest radiograph confirms appropriate catheter position. Catheter was flushed per protocol and secured externally with 0 Prolene suture. The patient tolerated procedure well.  COMPLICATIONS: COMPLICATIONS none  IMPRESSION: 1. Technically successful five [place] single lumen power injectable PICC placement   Electronically Signed   By: Corlis Leak M.D.   On: 07/24/2015 15:34   Dg Foot 2 Views Right  07/26/2015   CLINICAL DATA:  Right lateral heel region ulcer  EXAM: RIGHT  FOOT -  2 VIEW  COMPARISON:  None.  FINDINGS: Two views of the right foot submitted. No acute fracture or subluxation. Extensive osteopenia. Atherosclerotic vascular calcifications are noted. No bone destruction to suggest osteomyelitis.  IMPRESSION: Diffuse osteopenia. No acute fracture or subluxation. No definite evidence of osteomyelitis.   Electronically Signed   By: Natasha Mead M.D.   On: 07/26/2015 16:19     CBC  Recent Labs Lab 07/25/15 2227 07/26/15 0443 07/28/15 0700 07/29/15 0541  WBC 15.8* 16.1* 10.1 10.0  HGB 8.2* 8.3* 8.0* 7.3*  HCT 28.1* 29.0* 26.6* 24.4*  PLT 344 341 273 270  MCV 87.0 87.1 84.7 84.4  MCH 25.4* 24.9* 25.5* 25.3*  MCHC 29.2* 28.6* 30.1 29.9*  RDW 17.5* 17.5* 17.3* 17.2*  LYMPHSABS 1.8  --   --   --   MONOABS 1.3*  --   --   --   EOSABS 0.6  --   --   --   BASOSABS 0.1  --   --   --     Chemistries   Recent Labs Lab 07/26/15 0443  07/26/15 1807 07/27/15 0400 07/27/15 0644 07/27/15 1650 07/28/15 0700 07/28/15 1424 07/29/15 0541  NA 161*  < > 154* 154* 154* 149* 150* 147* 145  K 3.6  < > 3.6 3.4* 3.4*  --  3.0*  --  3.3*  CL >130*  < > 124* 125* 124*  --  120*  --  115*  CO2 24  < > 24 23 23   --  23  --  24  GLUCOSE 144*  < > 156* 143* 138*  --  124*  --  119*  BUN 55*  < > 54* 53* 47*  --  48*  --  44*  CREATININE 3.41*  < > 3.20* 2.91* 3.10*  --  2.87*  --  2.80*  CALCIUM 9.6  < > 9.5 9.8 9.8  --  9.8  --  9.3  MG  --   --   --   --   --   --   --   --  1.9  AST 20  --   --   --   --   --   --   --   --   ALT 11*  --   --   --   --   --   --   --   --   ALKPHOS 71  --   --   --   --   --   --   --   --   BILITOT 0.2*  --   --   --   --   --   --   --   --   < > = values in this interval not displayed. ------------------------------------------------------------------------------------------------------------------ estimated creatinine clearance is 16.7 mL/min (by C-G formula based on Cr of  2.8). ------------------------------------------------------------------------------------------------------------------ No results for input(s): HGBA1C in the last 72 hours. ------------------------------------------------------------------------------------------------------------------ No results for input(s): CHOL, HDL, LDLCALC, TRIG, CHOLHDL, LDLDIRECT in the last 72 hours. ------------------------------------------------------------------------------------------------------------------ No results for input(s): TSH, T4TOTAL, T3FREE, THYROIDAB in the last 72 hours.  Invalid input(s): FREET3 ------------------------------------------------------------------------------------------------------------------  Recent Labs  07/27/15 0644 07/27/15 0645  VITAMINB12 990*  --   FOLATE  --  24.4  FERRITIN 608*  --   TIBC NOT CALCULATED  --   IRON 28*  --   RETICCTPCT 0.8  --     Coagulation profile  Recent Labs Lab 07/26/15 0206  INR 1.33  No results for input(s): DDIMER in the last 72 hours.  Cardiac Enzymes No results for input(s): CKMB, TROPONINI, MYOGLOBIN in the last 168 hours.  Invalid input(s): CK ------------------------------------------------------------------------------------------------------------------ Invalid input(s): POCBNP     Time Spent in minutes   35 minutes   ELGERGAWY, DAWOOD M.D on 07/29/2015 at 4:23 PM  Between 7am to 7pm - Pager - [phone]  After 7pm go to [email or web address] - password [facility]  [facility]   Office  [phone]                                                                                         Patient Demographics  Joseph Ponce, is a 81 y.o. male, DOB - November 14, 1923, [id number]  Admit date - 07/25/2015   Admitting Physician Lorretta Harp, MD  Outpatient Primary MD for the patient is [name], MD  LOS - 3   Chief Complaint  Patient presents with  . Abnormal Lab         Subjective:   Joseph Ponce today denies any complaints of chest pain, shortness of breath, nausea or vomiting.  Assessment & Plan    Principal Problem:   Acute renal failure superimposed on stage 4 chronic kidney disease Active Problems:   Dementia without behavioral disturbance   Anemia   Renal artery stenosis   S/P BKA (below knee amputation) unilateral   Hypertension   Glaucoma (increased eye pressure)   COPD (chronic obstructive pulmonary disease)   Hyperlipemia   Osteoporosis   Chronic pain syndrome   Bradycardia, sinus   Depression   Hypernatremia   Dehydration   Infection of right foot   Decubitus ulcer of buttock, stage 3   Protein-calorie malnutrition  Acute on chronic renal failure - This is prerenal secondary to volume depletion as evident by his hyponatremia and physical exam. - Continue with IV fluid, patient had indwelling Foley catheter recently inserted at the nursing home.  Hypernatremia - Secondary to dehydration and volume depletion,hypernatremia is improving slowly, continue with D5W at 125 mL/h, monitor closely to avoid correction more than 12 mmol/L every 24Hours.  Acute encephalopathy - Patient with baseline dementia, with worsening mental status secondary to hypernatremia, as well as metabolic secondary to infection - Continues to improve  Right foot and sacral decubitus ulcer infection  - Patient initially on IV linezolid and Zosyn started at SNF  - Follow on blood cultures : No growth to date - Right foot x-ray with no evidence of osteomyelitis - We'll discontinue IV linezolid, will start on IV vancomycin - Will check MRI of sacral spine to rule osteomyelitis,  Dementia without behavioral disturbance: -continue Memantine  Anemia: -  hgb dropped from 11.8 (this is after PRBC transfusion) on 04/12/14 to 8.2 on admission - check FOBT - Iron 28, ferritin 608, folate and B-12 within normal limit, workup significant for anemia of chronic  illness  Hypertension: -Blood pressure acceptable, continue with hydralazine, hold amlodipine  Bradycardia - Symptomatic, continue telemetry monitor, no evidence of AV block on EKG.  Depression: -Continue home medications: mirtazapine  COPD (chronic obstructive pulmonary disease) - albuterol nbx prn  Hx of CVA - Continue with  aspirin - Continue to hold Plavix giving his profound anemia, and pending MRI results.   Protein-calorie malnutrition-moderate: -Continue nutrition supplement  Hypokalemia - Repleted,   Code Status: DO NOT RESUSCITATE, discussed with POA [name] 9/18  Family Communication: spoke with [facility] 9/18  Disposition Plan: Back to SNF once stable    Procedures  None    Consults   None    Medications  Scheduled Meds: . sodium chloride   Intravenous Once  . antiseptic oral rinse  7 mL Mouth Rinse BID  . aspirin  81 mg Oral Daily  . brimonidine  1 drop Both Eyes BID  . calcium-vitamin D  1 tablet Oral BID WC  . Chlorhexidine Gluconate Cloth  6 each Topical Q0600  . collagenase   Topical Daily  . desmopressin  1 spray Nasal TID  . dorzolamide  1 drop Both Eyes BID  . feeding supplement (NEPRO CARB STEADY)  237 mL Oral BID BM  . feeding supplement (PRO-STAT SUGAR FREE 64)  30 mL Oral BID  . gabapentin  100 mg Oral BID  . heparin  5,000 Units Subcutaneous 3 times per day  . hydrALAZINE  50 mg Oral 3 times per day  . hydroxypropyl methylcellulose / hypromellose  1 drop Both Eyes TID  . latanoprost  1 drop Both Eyes QHS  . memantine  28 mg Oral Daily  . mirtazapine  15 mg Oral QHS  . multivitamin  5 mL Oral Daily  . mupirocin ointment  1 application Nasal BID  . nitroGLYCERIN  0.2 mg Transdermal Daily  . piperacillin-tazobactam (ZOSYN)  IV  2.25 g Intravenous 3 times per day  . saccharomyces boulardii  250 mg Oral BID  . senna  1 tablet Oral QHS  . sodium chloride  3 mL Intravenous Q12H  . vancomycin  1,000 mg Intravenous Q48H   . vitamin C  500 mg Oral BID  . zinc sulfate  220 mg Oral Daily   Continuous Infusions: . dextrose 5 % and 0.45% NaCl 75 mL/hr at 07/29/15 0804   PRN Meds:.acetaminophen, albuterol, HYDROcodone-acetaminophen, ondansetron **OR** ondansetron (ZOFRAN) IV, polyethylene glycol, RESOURCE THICKENUP CLEAR, sodium chloride  DVT Prophylaxis   Heparin -  Lab Results  Component Value Date   PLT 270 07/29/2015    Antibiotics    Anti-infectives    Start     Dose/Rate Route Frequency Ordered Stop   07/27/15 0900  vancomycin (VANCOCIN) IVPB 1000 mg/200 mL premix     1,000 mg 200 mL/hr over 60 Minutes Intravenous Every 48 hours 07/27/15 0830     07/26/15 0300  piperacillin-tazobactam (ZOSYN) IVPB 2.25 g     2.25 g 100 mL/hr over 30 Minutes Intravenous 3 times per day 07/26/15 0257     07/26/15 0200  piperacillin-tazobactam (ZOSYN) injection 2.25 g  Status:  Discontinued     2.25 g Intramuscular Every 8 hours 07/26/15 0052 07/26/15 0257   07/26/15 0200  linezolid (ZYVOX) tablet 600 mg  Status:  Discontinued     600 mg Oral 2 times daily 07/26/15 0052 07/27/15 0808          Objective:   Filed Vitals:   07/28/15 1550 07/29/15 0005 07/29/15 0455 07/29/15 0828  BP: 103/55 153/63 135/52 130/74  Pulse: 52 56 50 47  Temp: 98.2 F (36.8 C)  98 F (36.7 C)   TempSrc: Oral  Oral   Resp: Height:      Weight:  SpO2: 96% 100% 100% 99%    Wt Readings from Last 3 Encounters:  07/26/15 72.576 kg (160 lb)  03/24/15 74.844 kg (165 lb)  02/24/15 75.751 kg (167 lb)     Intake/Output Summary (Last 24 hours) at 07/29/15 1624 Last data filed at 07/29/15 0600  Gross per 24 hour  Intake    580 ml  Output    100 ml  Net    480 ml     Physical Exam more awake and coherent today. Supple Neck,No JVD,  Symmetrical Chest wall movement, Good air movement bilaterally,  RRR,No Gallops,Rubs or new Murmurs,  +ve B.Sounds, Abd Soft, No tenderness, No organomegaly appriciated,   left BKA, right heel pressure ulcer, stage III decubitus sacral ulcer with minimal discharge   Data Review   Micro Results Recent Results (from the past 240 hour(s))  Culture, blood (x 2)     Status: None (Preliminary result)   Collection Time: 07/26/15  1:59 AM  Result Value Ref Range Status   Specimen Description BLOOD RIGHT HAND  Final   Special Requests IN PEDIATRIC BOTTLE 3CC  Final   Culture NO GROWTH 3 DAYS  Final   Report Status PENDING  Incomplete  MRSA PCR Screening     Status: Abnormal   Collection Time: 07/26/15  2:02 AM  Result Value Ref Range Status   MRSA by PCR POSITIVE (A) NEGATIVE Final    Comment:        The GeneXpert MRSA Assay (FDA approved for NASAL specimens only), is one component of a comprehensive MRSA colonization surveillance program. It is not intended to diagnose MRSA infection nor to guide or monitor treatment for MRSA infections. RESULT CALLED TO, READ BACK BY AND VERIFIED WITH: ZEE MAHMOUD@[id number]  07/26/15 MKELLY   Culture, blood (x 2)     Status: None (Preliminary result)   Collection Time: 07/26/15  2:06 AM  Result Value Ref Range Status   Specimen Description BLOOD LEFT HAND  Final   Special Requests IN PEDIATRIC BOTTLE 3CC  Final   Culture NO GROWTH 3 DAYS  Final   Report Status PENDING  Incomplete    Radiology Reports Ct Lumbar Spine Wo Contrast  07/27/2015   CLINICAL DATA:  Extreme back pain, in addition to pressure ulcer. History of dementia, chronic kidney disease, below-knee amputation.  EXAM: CT LUMBAR SPINE WITHOUT CONTRAST  TECHNIQUE: Multidetector CT imaging of the lumbar spine was performed without intravenous contrast administration. Multiplanar CT image reconstructions were also generated.  COMPARISON:  None.  FINDINGS: Lumbar vertebral bodies and posterior elements are intact and aligned and maintenance of lumbar lordosis. Using the reference level of the last well-formed intervertebral disc as L5-S1, moderate L2-3 through L4-5  disc height loss, intradiscal calcification. Vacuum disc at L3-4. Probable auto body intern arthrodesis at L2-3. Mild endplate spurring Z6-1 through L4-5. Mild osteopenia without destructive bony lesions. Small probable enchondroma RIGHT iliac bone. Status post bilateral hip total arthroplasties.  Partially imaged small pleural effusions. 3.3 cm LEFT renal cyst. Vascular calcifications versus nonobstructing RIGHT nephrolithiasis. Large amount of stool distending the sigmoid colon and rectum.  Small broad-based disc osteophyte complex at L2-3 results in mild canal stenosis. Mild to moderate RIGHT L4-5 and L5-S1 osseous neural foraminal narrowing.  IMPRESSION: No acute lumbar spine fracture, malalignment.  Mild canal stenosis at L2-3. Mild to moderate RIGHT L4-5 and L5-S1 neural foraminal narrowing.   Electronically Signed   By: Awilda Metro M.D.   On: 07/27/2015 23:00   US Renal  07/26/2015   CLINICAL DATA:  80 year old male with acute renal insufficiency.  EXAM: RENAL / URINARY TRACT ULTRASOUND COMPLETE  COMPARISON:  None.  FINDINGS: Right Kidney:  Length: 11 cm. Diffuse increased renal echogenicity compatible with underlying medical renal disease. Clinical correlation is recommended. A 2.2 x 1.5 x 1.9 cm upper pole cyst is seen. There is no hydronephrosis. Evaluation for stone or underlying solid lesion is limited due to increased echogenicity.  Left Kidney:  Length: 11 cm. There are multiple cysts in the left kidney measuring up to 4.1 x 4.4 x 3.9 cm in the upper pole. There is diffuse increased renal parenchymal echogenicity. No hydronephrosis.  Bladder:  A Foley catheter is noted within the partially distended urinary bladder.  IMPRESSION: Increased bilateral renal echogenicity likely related side and aligned medical renal disease. Clinical correlation is recommended.  No hydronephrosis.  Bilateral renal cysts.   Electronically Signed   By: Elgie Collard M.D.   On: 07/26/2015 04:27   Ir Fluoro Guide  Cv Line Right  07/24/2015   CLINICAL DATA:  Urinary tract infection, needs venous access for antibiotics  EXAM: PICC PLACEMENT WITH ULTRASOUND AND FLUOROSCOPY  FLUOROSCOPY TIME:  12 seconds  TECHNIQUE: After informed consent via telephone from family was obtained, patient was placed in the supine position on angiographic table. Patency of the right brachial vein was confirmed with ultrasound with image documentation. An appropriate skin site was determined. Skin site was marked. Region was prepped using maximum barrier technique including cap and mask, sterile gown, sterile gloves, large sterile sheet, and Chlorhexidine as cutaneous antisepsis. The region was infiltrated locally with 1% lidocaine. Under real-time ultrasound guidance, the right brachial vein was accessed with a 21 gauge micropuncture needle; the needle tip within the vein was confirmed with ultrasound image documentation. Needle exchanged over a 018 guidewire for a peel-away sheath, through which a 5-French single-lumen power injectable PICC trimmed to 38cm was advanced, positioned with its tip near the cavoatrial junction. Spot chest radiograph confirms appropriate catheter position. Catheter was flushed per protocol and secured externally with 0 Prolene suture. The patient tolerated procedure well.  COMPLICATIONS: COMPLICATIONS none  IMPRESSION: 1. Technically successful five [place] single lumen power injectable PICC placement   Electronically Signed   By: Corlis Leak M.D.   On: 07/24/2015 15:34   Ir US Guide Vasc Access Right  07/24/2015   CLINICAL DATA:  Urinary tract infection, needs venous access for antibiotics  EXAM: PICC PLACEMENT WITH ULTRASOUND AND FLUOROSCOPY  FLUOROSCOPY TIME:  12 seconds  TECHNIQUE: After informed consent via telephone from family was obtained, patient was placed in the supine position on angiographic table. Patency of the right brachial vein was confirmed with ultrasound with image documentation. An appropriate skin  site was determined. Skin site was marked. Region was prepped using maximum barrier technique including cap and mask, sterile gown, sterile gloves, large sterile sheet, and Chlorhexidine as cutaneous antisepsis. The region was infiltrated locally with 1% lidocaine. Under real-time ultrasound guidance, the right brachial vein was accessed with a 21 gauge micropuncture needle; the needle tip within the vein was confirmed with ultrasound image documentation. Needle exchanged over a 018 guidewire for a peel-away sheath, through which a 5-French single-lumen power injectable PICC trimmed to 38cm was advanced, positioned with its tip near the cavoatrial junction. Spot chest radiograph confirms appropriate catheter position. Catheter was flushed per protocol and secured externally with 0 Prolene suture. The patient tolerated procedure well.  COMPLICATIONS: COMPLICATIONS none  IMPRESSION: 1. Technically  successful five [place] single lumen power injectable PICC placement   Electronically Signed   By: Corlis Leak M.D.   On: 07/24/2015 15:34   Dg Foot 2 Views Right  07/26/2015   CLINICAL DATA:  Right lateral heel region ulcer  EXAM: RIGHT FOOT - 2 VIEW  COMPARISON:  None.  FINDINGS: Two views of the right foot submitted. No acute fracture or subluxation. Extensive osteopenia. Atherosclerotic vascular calcifications are noted. No bone destruction to suggest osteomyelitis.  IMPRESSION: Diffuse osteopenia. No acute fracture or subluxation. No definite evidence of osteomyelitis.   Electronically Signed   By: Natasha Mead M.D.   On: 07/26/2015 16:19     CBC  Recent Labs Lab 07/25/15 2227 07/26/15 0443 07/28/15 0700 07/29/15 0541  WBC 15.8* 16.1* 10.1 10.0  HGB 8.2* 8.3* 8.0* 7.3*  HCT 28.1* 29.0* 26.6* 24.4*  PLT 344 341 273 270  MCV 87.0 87.1 84.7 84.4  MCH 25.4* 24.9* 25.5* 25.3*  MCHC 29.2* 28.6* 30.1 29.9*  RDW 17.5* 17.5* 17.3* 17.2*  LYMPHSABS 1.8  --   --   --   MONOABS 1.3*  --   --   --   EOSABS 0.6   --   --   --   BASOSABS 0.1  --   --   --     Chemistries   Recent Labs Lab 07/26/15 0443  07/26/15 1807 07/27/15 0400 07/27/15 0644 07/27/15 1650 07/28/15 0700 07/28/15 1424 07/29/15 0541  NA 161*  < > 154* 154* 154* 149* 150* 147* 145  K 3.6  < > 3.6 3.4* 3.4*  --  3.0*  --  3.3*  CL >130*  < > 124* 125* 124*  --  120*  --  115*  CO2 24  < > 24 23 23   --  23  --  24  GLUCOSE 144*  < > 156* 143* 138*  --  124*  --  119*  BUN 55*  < > 54* 53* 47*  --  48*  --  44*  CREATININE 3.41*  < > 3.20* 2.91* 3.10*  --  2.87*  --  2.80*  CALCIUM 9.6  < > 9.5 9.8 9.8  --  9.8  --  9.3  MG  --   --   --   --   --   --   --   --  1.9  AST 20  --   --   --   --   --   --   --   --   ALT 11*  --   --   --   --   --   --   --   --   ALKPHOS 71  --   --   --   --   --   --   --   --   BILITOT 0.2*  --   --   --   --   --   --   --   --   < > = values in this interval not displayed. ------------------------------------------------------------------------------------------------------------------ estimated creatinine clearance is 16.7 mL/min (by C-G formula based on Cr of 2.8). ------------------------------------------------------------------------------------------------------------------ No results for input(s): HGBA1C in the last 72 hours. ------------------------------------------------------------------------------------------------------------------ No results for input(s): CHOL, HDL, LDLCALC, TRIG, CHOLHDL, LDLDIRECT in the last 72 hours. ------------------------------------------------------------------------------------------------------------------ No results for input(s): TSH, T4TOTAL, T3FREE, THYROIDAB in the last 72 hours.  Invalid input(s): FREET3 ------------------------------------------------------------------------------------------------------------------  Recent Labs  07/27/15 0644 07/27/15 0645  [id number]  990*  --   FOLATE  --  24.4  FERRITIN 608*  --   TIBC NOT  CALCULATED  --   IRON 28*  --   RETICCTPCT 0.8  --     Coagulation profile  Recent Labs Lab 07/26/15 0206  INR 1.33    No results for input(s): DDIMER in the last 72 hours.  Cardiac Enzymes No results for input(s): CKMB, TROPONINI, MYOGLOBIN in the last 168 hours.  Invalid input(s): CK ------------------------------------------------------------------------------------------------------------------ Invalid input(s): POCBNP     Time Spent in minutes   35 minutes   ELGERGAWY, DAWOOD M.D on 07/29/2015 at 4:24 PM  Between 7am to 7pm - Pager - [phone]  After 7pm go to [email or web address] - password [facility]  [facility]   Office  [phone]

## 2015-07-29 NOTE — Care Management Important Message (Signed)
Important Message  Patient Details  Name: Joseph Ponce MRN: [id number] Date of Birth: Jun 02, 1924   Medicare Important Message Given:  Yes-second notification given    Starlyn Skeans, RN 07/29/2015, 1:03 PM

## 2015-07-30 DIAGNOSIS — L89150 Pressure ulcer of sacral region, unstageable: Secondary | ICD-10-CM

## 2015-07-30 DIAGNOSIS — Z89512 Acquired absence of left leg below knee: Secondary | ICD-10-CM

## 2015-07-30 DIAGNOSIS — L089 Local infection of the skin and subcutaneous tissue, unspecified: Secondary | ICD-10-CM

## 2015-07-30 DIAGNOSIS — E46 Unspecified protein-calorie malnutrition: Secondary | ICD-10-CM

## 2015-07-30 LAB — GLUCOSE, CAPILLARY: Glucose-Capillary: 98 mg/dL (ref 65–99)

## 2015-07-30 LAB — BASIC METABOLIC PANEL
Anion gap: 6 (ref 5–15)
BUN: 42 mg/dL — AB (ref 6–20)
CO2: 22 mmol/L (ref 22–32)
Calcium: 9.2 mg/dL (ref 8.9–10.3)
Chloride: 114 mmol/L — ABNORMAL HIGH (ref 101–111)
Creatinine, Ser: 2.71 mg/dL — ABNORMAL HIGH (ref 0.61–1.24)
GFR calc Af Amer: 22 mL/min — ABNORMAL LOW (ref >60)
GFR, EST NON AFRICAN AMERICAN: 19 mL/min — AB (ref >60)
GLUCOSE: 81 mg/dL (ref 65–99)
POTASSIUM: 3.3 mmol/L — AB (ref 3.5–5.1)
Sodium: 142 mmol/L (ref 135–145)

## 2015-07-30 LAB — CBC
HCT: 27.7 % — ABNORMAL LOW (ref 39.0–52.0)
Hemoglobin: 8.5 g/dL — ABNORMAL LOW (ref 13.0–17.0)
MCH: 25.6 pg — AB (ref 26.0–34.0)
MCHC: 30.7 g/dL (ref 30.0–36.0)
MCV: 83.4 fL (ref 78.0–100.0)
PLATELETS: 270 10*3/uL (ref 150–400)
RBC: 3.32 MIL/uL — AB (ref 4.22–5.81)
RDW: 16.9 % — AB (ref 11.5–15.5)
WBC: 10.8 10*3/uL — ABNORMAL HIGH (ref 4.0–10.5)

## 2015-07-30 NOTE — Consult Note (Signed)
[facility] Surgery Consult Note  Joseph Ponce 1924-05-26  [id number].    Requesting MD: Dr. Erlinda Hong Chief Complaint/Reason for Consult: Sacral decubitus ulcer  HPI:  81 y/o AA male with dementia/alzheimer disease, multiple co-morbidities presents to [facility] from SNF for abnormal labs.  It seems that patient was sent from [facility] for elevated creatinine and BUN. He does not have complaints. Of note, patient was started with IV linezolid and zosyn for right foot heal infection and decubitus infection via PICC line on 07/24/15 (supposed to take for two weeks).  Over the last 2 months he was told by ortho to be NWB due to a chronic problem in the right hip and he has a BKA on left which has made him bed bound.  Previous to this he was able to get around in a wheelchair.    We were asked to evaluate the sacral decubitus ulcer for consideration of bedside debridement.  The patient is demented and tells you nothing is wrong and he doesn't want to be evaluated.     ROS: All systems reviewed and otherwise negative except for as above  History reviewed. No pertinent family history.  Past Medical History  Diagnosis Date  . Dementia   . Muscle weakness   . Osteoporosis   . Alzheimer disease   . Failure to thrive in adult   . Dementia   . CVA (cerebral vascular accident)     speech and language d/o deficits  . Weakness   . Hyperlipemia   . Dysphagia   . Anemia   . PVD (peripheral vascular disease)   . COPD (chronic obstructive pulmonary disease)   . Glaucoma (increased eye pressure)   . Hypertension   . S/P BKA (below knee amputation) unilateral 01/21/2006    left  . Renal artery stenosis   . CKD (chronic kidney disease), stage IV     Past Surgical History  Procedure Laterality Date  . Leg amputation below knee      left    Social History:  reports that he quit smoking about 3 months ago. He does not have any smokeless tobacco history on file. He reports that he does not  drink alcohol or use illicit drugs.  Allergies: No Known Allergies  Medications Prior to Admission  Medication Sig Dispense Refill  . acetaminophen (TYLENOL) 500 MG tablet Take 1,000 mg by mouth 2 (two) times daily.    . Amino Acids-Protein Hydrolys (FEEDING SUPPLEMENT, PRO-STAT SUGAR FREE 64,) LIQD Take 30 mLs by mouth 2 (two) times daily.    Marland Kitchen amLODipine (NORVASC) 10 MG tablet Take 10 mg by mouth daily.    Marland Kitchen aspirin 81 MG chewable tablet Chew 81 mg by mouth daily.    . brimonidine (ALPHAGAN P) 0.1 % SOLN Place 1 drop into both eyes 2 (two) times daily.    . calcium-vitamin D (OSCAL WITH D) 500-200 MG-UNIT per tablet Take 1 tablet by mouth 2 (two) times daily.    Marland Kitchen desmopressin (DDAVP) 0.01 % nasal solution Place 10 mcg into the nose 3 (three) times daily.    . dorzolamide (TRUSOPT) 2 % ophthalmic solution Place 1 drop into both eyes 2 (two) times daily.    . feeding supplement, RESOURCE BREEZE, (RESOURCE BREEZE) LIQD Take 1 Container by mouth 2 (two) times daily between meals.    . gabapentin (NEURONTIN) 100 MG capsule Take 100 mg by mouth 2 (two) times daily.    . hydrALAZINE (APRESOLINE) 50 MG tablet Take 50  mg by mouth 3 (three) times daily.    Marland Kitchen HYDROcodone-acetaminophen (NORCO/VICODIN) 5-325 MG per tablet Take one tablet by mouth twice daily for chronic pain. Do not exceed 4gm of Tylenol in 24 hours. 60 tablet 0  . hydroxypropyl methylcellulose (ISOPTO TEARS) 2.5 % ophthalmic solution Place 1 drop into both eyes 3 (three) times daily.    Marland Kitchen linezolid (ZYVOX) 600 MG tablet Take 600 mg by mouth 2 (two) times daily.    . memantine (NAMENDA XR) 28 MG CP24 24 hr capsule Take 28 mg by mouth daily.    . metoCLOPramide (REGLAN) 5 MG tablet Take 5 mg by mouth 3 (three) times daily before meals.    . mirtazapine (REMERON) 15 MG tablet Take 15 mg by mouth at bedtime.    . Multiple Vitamins-Minerals (CERTA-VITE PO) Take 1 tablet by mouth daily.    . nitroGLYCERIN (NITRODUR - DOSED IN MG/24 HR) 0.2  mg/hr Place 1 patch onto the skin daily.    . piperacillin-tazobactam (ZOSYN) 2.25 (2-0.25) G injection Inject 2.25 g into the muscle every 8 (eight) hours. 2 week regimen starting at 6pm on 9/15    . polyethylene glycol (MIRALAX / GLYCOLAX) packet Take 17 g by mouth daily.    Marland Kitchen saccharomyces boulardii (FLORASTOR) 250 MG capsule Take 250 mg by mouth 2 (two) times daily.    Marland Kitchen senna (SENOKOT) 8.6 MG TABS tablet Take 1 tablet by mouth at bedtime.    . Travoprost, BAK Free, (TRAVATAN) 0.004 % SOLN ophthalmic solution Place 1 drop into both eyes at bedtime.    . vitamin C (ASCORBIC ACID) 500 MG tablet Take 500 mg by mouth 2 (two) times daily.    Marland Kitchen zinc sulfate 220 MG capsule Take 220 mg by mouth daily.    Marland Kitchen denosumab (PROLIA) 60 MG/ML SOLN injection Inject 60 mg into the skin every 6 (six) months.     . magnesium hydroxide (MILK OF MAGNESIA) 400 MG/5ML suspension Take 30 mLs by mouth daily as needed. For constipation      Blood pressure 157/67, pulse 55, temperature 97.5 F (36.4 C), temperature source Oral, resp. rate 13, height '5\' 7"'  (1.702 m), weight 72.576 kg (160 lb), SpO2 98 %. Physical Exam: General: WD/WN AA male who is laying in bed in NAD Skin: 8 x 12 cm decubitus ulcer un-stageable with large area of 100% eschar, quite tender to palpation Psych: disoriented, agitated, combative, throwing punches  -He will not let me examine him further   07/30/15 wound check   Results for orders placed or performed during the hospital encounter of 07/25/15 (from the past 48 hour(s))  Basic metabolic panel     Status: Abnormal   Collection Time: 07/29/15  5:41 AM  Result Value Ref Range   Sodium 145 135 - 145 mmol/L   Potassium 3.3 (L) 3.5 - 5.1 mmol/L   Chloride 115 (H) 101 - 111 mmol/L   CO2 24 22 - 32 mmol/L   Glucose, Bld 119 (H) 65 - 99 mg/dL   BUN 44 (H) 6 - 20 mg/dL   Creatinine, Ser 2.80 (H) 0.61 - 1.24 mg/dL   Calcium 9.3 8.9 - 10.3 mg/dL   GFR calc non Af Amer 19 (L) >60 mL/min    GFR calc Af Amer 22 (L) >60 mL/min    Comment: (NOTE) The eGFR has been calculated using the CKD EPI equation. This calculation has not been validated in all clinical situations. eGFR's persistently <60 mL/min signify possible Chronic Kidney Disease.  Anion gap 6 5 - 15  CBC     Status: Abnormal   Collection Time: 07/29/15  5:41 AM  Result Value Ref Range   WBC 10.0 4.0 - 10.5 K/uL   RBC 2.89 (L) 4.22 - 5.81 MIL/uL   Hemoglobin 7.3 (L) 13.0 - 17.0 g/dL   HCT 24.4 (L) 39.0 - 52.0 %   MCV 84.4 78.0 - 100.0 fL   MCH 25.3 (L) 26.0 - 34.0 pg   MCHC 29.9 (L) 30.0 - 36.0 g/dL   RDW 17.2 (H) 11.5 - 15.5 %   Platelets 270 150 - 400 K/uL  Magnesium     Status: None   Collection Time: 07/29/15  5:41 AM  Result Value Ref Range   Magnesium 1.9 1.7 - 2.4 mg/dL  Phosphorus     Status: None   Collection Time: 07/29/15  5:41 AM  Result Value Ref Range   Phosphorus 3.7 2.5 - 4.6 mg/dL  Glucose, capillary     Status: Abnormal   Collection Time: 07/29/15  8:21 AM  Result Value Ref Range   Glucose-Capillary 105 (H) 65 - 99 mg/dL  Occult blood card to lab, stool     Status: None   Collection Time: 07/29/15 10:17 AM  Result Value Ref Range   Fecal Occult Bld NEGATIVE NEGATIVE  Prepare RBC     Status: None   Collection Time: 07/29/15  4:21 PM  Result Value Ref Range   Order Confirmation ORDER PROCESSED BY BLOOD BANK   Type and screen     Status: None (Preliminary result)   Collection Time: 07/29/15  5:45 PM  Result Value Ref Range   ABO/RH(D) O POS    Antibody Screen NEG    Sample Expiration 08/01/2015    Unit Number [id number]    Blood Component Type RBC LR PHER2    Unit division 00    Status of Unit ISSUED    Transfusion Status OK TO TRANSFUSE    Crossmatch Result Compatible   CBC     Status: Abnormal   Collection Time: 07/30/15  6:30 AM  Result Value Ref Range   WBC 10.8 (H) 4.0 - 10.5 K/uL   RBC 3.32 (L) 4.22 - 5.81 MIL/uL   Hemoglobin 8.5 (L) 13.0 - 17.0 g/dL   HCT 27.7  (L) 39.0 - 52.0 %   MCV 83.4 78.0 - 100.0 fL   MCH 25.6 (L) 26.0 - 34.0 pg   MCHC 30.7 30.0 - 36.0 g/dL   RDW 16.9 (H) 11.5 - 15.5 %   Platelets 270 150 - 400 K/uL  Basic metabolic panel     Status: Abnormal   Collection Time: 07/30/15  6:30 AM  Result Value Ref Range   Sodium 142 135 - 145 mmol/L   Potassium 3.3 (L) 3.5 - 5.1 mmol/L   Chloride 114 (H) 101 - 111 mmol/L   CO2 22 22 - 32 mmol/L   Glucose, Bld 81 65 - 99 mg/dL   BUN 42 (H) 6 - 20 mg/dL   Creatinine, Ser 2.71 (H) 0.61 - 1.24 mg/dL   Calcium 9.2 8.9 - 10.3 mg/dL   GFR calc non Af Amer 19 (L) >60 mL/min   GFR calc Af Amer 22 (L) >60 mL/min    Comment: (NOTE) The eGFR has been calculated using the CKD EPI equation. This calculation has not been validated in all clinical situations. eGFR's persistently <60 mL/min signify possible Chronic Kidney Disease.    Anion gap 6 5 - 15  Glucose, capillary     Status: None   Collection Time: 07/30/15  7:36 AM  Result Value Ref Range   Glucose-Capillary 98 65 - 99 mg/dL   Mr Pelvis Wo Contrast  07/30/2015   CLINICAL DATA:  Patient with baseline dementia, with worsening mental status secondary to hypernatremia, as well as metabolic secondary to infection. Acute renal failure superimposed on stage 4 chronic kidney disease. Decubitus ulcer of buttock.  EXAM: MRI PELVIS WITHOUT CONTRAST  TECHNIQUE: Multiplanar multisequence MR imaging of the pelvis was performed. No intravenous contrast was administered.  COMPARISON:  None.  FINDINGS: There are bilateral total hip arthroplasties with susceptibility artifact partially obscuring the adjacent soft tissue and osseous structures.  There is no focal marrow signal abnormality. There is no acute fracture or dislocation. The sacroiliac joints are normal. There is no sacroiliac joint effusion.  There is a sacral decubitus ulcer with mild subcutaneous edema. The underlying sacrum demonstrates no cortical destruction, periosteal reaction or marrow signal  abnormality. There is chronic malalignment of the coccyx.  There is no focal fluid collection or hematoma.  There is mild generalized anasarca.  There is no pelvic free fluid. There is rectal fecal impaction with mild rectal wall thickening. There is no inguinal hernia. There is no inguinal lymphadenopathy.  There is mild edema within the left gluteus maximus muscle particularly along the medial aspect.  IMPRESSION: 1. No evidence of sacral osteomyelitis. 2. Mild edema within the left gluteus maximus muscle particularly along the medial aspect which may reflect mild myositis secondary to an infectious or inflammatory etiology. There is no drainable fluid collection or hematoma.   Electronically Signed   By: Kathreen Devoid   On: 07/30/2015 08:01      Assessment/Plan Sacral decubitus ulcer - un-stageable  Plan: 1.  Patient has multiple co-morbidities which would limit his ability to be cleared for surgery.  The patient is currently combative with staff and does not want to be rolled.  We will not be able to do a bedside debridement.  Could try hydrotherapy, but concern for staff safety is an issue due to him being combative 2.  Would recommend family discussion given his DNR status.  Would not recommend surgery because it can not guarantee that he will heal his wound given current medical state and inability to mobilize and stay off his wound.   3.  Recommend ortho if needed for heal chronic ulcer    Nat Christen, [facility] Surgery 07/30/2015, 4:45 PM Pager: [phone]

## 2015-07-30 NOTE — Progress Notes (Signed)
ANTIBIOTIC CONSULT NOTE - FOLLOW UP  Pharmacy Consult for vancomycin Indication: R foot and sacral decubitus infections  No Known Allergies  Patient Measurements: Height:  (170.2 cm) Weight: 160 lb (72.576 kg) IBW/kg (Calculated) : 66.1  Vital Signs: Temp: 97.5 F (36.4 C) (09/21 0738) Temp Source: Oral (09/21 0738) BP: 157/67 mmHg (09/21 0738) Pulse Rate: 55 (09/21 0738) Intake/Output from previous day: 09/20 0701 - 09/21 0700 In: 344 [I.V.:10; Blood:334] Out: -  Intake/Output from this shift: Total I/O In: 160 [P.O.:160] Out: 400 [Urine:400]  Labs:  Recent Labs  07/28/15 0700 07/29/15 0541 07/30/15 0630  WBC 10.1 10.0 10.8*  HGB 8.0* 7.3* 8.5*  PLT 273 270 270  CREATININE 2.87* 2.80* 2.71*   Estimated Creatinine Clearance: 17.3 mL/min (by C-G formula based on Cr of 2.71). No results for input(s): VANCOTROUGH, VANCOPEAK, VANCORANDOM, GENTTROUGH, GENTPEAK, GENTRANDOM, TOBRATROUGH, TOBRAPEAK, TOBRARND, AMIKACINPEAK, AMIKACINTROU, AMIKACIN in the last 72 hours.   Microbiology: Recent Results (from the past 720 hour(s))  Culture, blood (x 2)     Status: None (Preliminary result)   Collection Time: 07/26/15  1:59 AM  Result Value Ref Range Status   Specimen Description BLOOD RIGHT HAND  Final   Special Requests IN PEDIATRIC BOTTLE 3CC  Final   Culture NO GROWTH 3 DAYS  Final   Report Status PENDING  Incomplete  MRSA PCR Screening     Status: Abnormal   Collection Time: 07/26/15  2:02 AM  Result Value Ref Range Status   MRSA by PCR POSITIVE (A) NEGATIVE Final    Comment:        The GeneXpert MRSA Assay (FDA approved for NASAL specimens only), is one component of a comprehensive MRSA colonization surveillance program. It is not intended to diagnose MRSA infection nor to guide or monitor treatment for MRSA infections. RESULT CALLED TO, READ BACK BY AND VERIFIED WITH: ZEE MAHMOUD@[id number]  07/26/15 MKELLY   Culture, blood (x 2)     Status: None (Preliminary  result)   Collection Time: 07/26/15  2:06 AM  Result Value Ref Range Status   Specimen Description BLOOD LEFT HAND  Final   Special Requests IN PEDIATRIC BOTTLE 3CC  Final   Culture NO GROWTH 3 DAYS  Final   Report Status PENDING  Incomplete    Anti-infectives    Start     Dose/Rate Route Frequency Ordered Stop   07/27/15 0900  vancomycin (VANCOCIN) IVPB 1000 mg/200 mL premix     1,000 mg 200 mL/hr over 60 Minutes Intravenous Every 48 hours 07/27/15 0830     07/26/15 0300  piperacillin-tazobactam (ZOSYN) IVPB 2.25 g     2.25 g 100 mL/hr over 30 Minutes Intravenous 3 times per day 07/26/15 0257     07/26/15 0200  piperacillin-tazobactam (ZOSYN) injection 2.25 g  Status:  Discontinued     2.25 g Intramuscular Every 8 hours 07/26/15 0052 07/26/15 0257   07/26/15 0200  linezolid (ZYVOX) tablet 600 mg  Status:  Discontinued     600 mg Oral 2 times daily 07/26/15 0052 07/27/15 0808      Assessment: 81 y/o male sent from [facility] for abnormal lab work. He is on day 4 vancomycin for R foot and sacral decubitus infections. MRI neg for sacral osteo. XR R foot neg for osteo. He is afebrile, WBC are just above normal, SCr down from 3.5 but stable in the 2.7-8s over the last 3 days, est CrCl ~17 ml/min.  Zosyn 9/14> Linezolid 9/14>9/18 Vancomycin 9/18>>  9/17:  blood x 2>> NGTD 9/17 MRSA PCR positive  Goal of Therapy:  Vancomycin trough level 15-20 mcg/ml  Plan:  - Continue vancomycin 1000 mg IV q48h - Monitor renal function and adjust dose as needed - F/u LOT, trough as clinically indicated  [facility], [address].D., BCPS Clinical Pharmacist Pager: [phone] 07/30/2015 10:59 AM

## 2015-07-30 NOTE — Progress Notes (Signed)
Attempted one two different occasions to administer pt's medications. Pt refusing to take oral medications. Pt also refusing to hold head still for eye drops and nasal swabs. Pt states, "get out of here and leave me alone before I hit you." Pt did in fact swing both arms and stated, "I told you to leave me alone!" MD made aware of pt's refusal to take medication.

## 2015-07-30 NOTE — Progress Notes (Signed)
Patient Demographics  Joseph Ponce, is a 81 y.o. male, DOB - 1924/07/07, ZOX:[id number]  Admit date - 07/25/2015   Admitting Physician Lorretta Harp, MD  Outpatient Primary MD for the patient is [name], MD  LOS - 4   Chief Complaint  Patient presents with  . Abnormal Lab         Subjective:   Joseph Ponce not cooperating with exam, does talk though not coherent, per RN patient has been more confused, refusing meds  Assessment & Plan    Principal Problem:   Acute renal failure superimposed on stage 4 chronic kidney disease Active Problems:   Dementia without behavioral disturbance   Anemia   Renal artery stenosis   S/P BKA (below knee amputation) unilateral   Hypertension   Glaucoma (increased eye pressure)   COPD (chronic obstructive pulmonary disease)   Hyperlipemia   Osteoporosis   Chronic pain syndrome   Bradycardia, sinus   Depression   Hypernatremia   Dehydration   Infection of right foot   Decubitus ulcer of buttock, stage 3   Protein-calorie malnutrition  Acute on chronic renal failure - This is prerenal secondary to volume depletion as evident by his hyponatremia and physical exam. - Continue with IV fluid, patient had indwelling Foley catheter recently inserted at the nursing home. - improving with IV fluids , Slight creatinine 1.7-2, peaked at 3.4, continue to improved, suspect urine output not accurately documented. Iv stopped on 9/20,continue monitor.  Hypernatremia, with h/o neurogenic diabetes insipitus (per SNF note, has been on DDVAP chronically for years) - DDAVP continued and patient also recieved  D5W at 125 mL/h,   later with D5 half-normal saline at 75 mL per hour , this was stopped on 9/19, na wnl since 9/20, continue monitor  Acute encephalopathy - Patient with baseline dementia, with worsening mental status secondary to hypernatremia, as well as  metabolic secondary to infection -per RN more lethargic, not cooperative, refusing oral meds.  Right foot and sacral decubitus ulcer infection ,  - per SNF notes patient has been bed bound for a few months, then developed ulcer, was treated conservatively with topical enzymatic debridement and oral abx, however, with worsening symptom, patient was started on IV linezolid and Zosyn at SNF mid september - Follow on blood cultures : No growth to date - - IV linezolid discontinued since admission, he is started on IV vancomycin from 9/18and zosyn from 9/17 -  Right foot x-ray with no evidence of osteomyelitis, MRI of sacral spine no osteomyelitis, does has left gluteus muscle edema, surgery consulted for further recommendations  Dementia without behavioral disturbance: -continue Memantine  Anemia: -  hgb dropped from 11.8 (this is after PRBC transfusion) on 04/12/14 to 8.2 on admission, dropped today to 7.3, and this is most likely dilutional effect as an IV fluids, no evidence of GI bleed, FOBT negative, will transfuse 1 unit PRBC today.  -  FOBT negative - Iron 28, ferritin 608, folate and B-12 within normal limit, workup significant for anemia of chronic illness, and renal insufficiency .  Hypertension: -Blood pressure acceptable, continue with hydralazine, continue to  hold amlodipine  Bradycardia - Symptomatic, continue telemetry monitor, no evidence of AV block on EKG.  Depression: -Continue home medications:  mirtazapine  COPD (chronic obstructive pulmonary disease) - albuterol nbx prn  Hx of CVA: -ASA - Continue to hold Plavix,given his anemia  protien-calorie malnutrition-moderate: -Continue nutrition supplement  Hypokalemia - Repleted, recheck in a.m., check magnesium as well.  Code Status: DO NOT RESUSCITATE, discussed with POA [name] 9/18  Family Communication: spoke with [facility] 9/18  Disposition Plan: pending, may need palliative care consult for  goal of care   Procedures  None    Consults   General surgery   Medications  Scheduled Meds: . sodium chloride   Intravenous Once  . antiseptic oral rinse  7 mL Mouth Rinse BID  . aspirin  81 mg Oral Daily  . brimonidine  1 drop Both Eyes BID  . calcium-vitamin D  1 tablet Oral BID WC  . collagenase   Topical Daily  . desmopressin  1 spray Nasal TID  . dorzolamide  1 drop Both Eyes BID  . feeding supplement (NEPRO CARB STEADY)  237 mL Oral BID BM  . feeding supplement (PRO-STAT SUGAR FREE 64)  30 mL Oral BID  . gabapentin  100 mg Oral BID  . heparin  5,000 Units Subcutaneous 3 times per day  . hydrALAZINE  50 mg Oral 3 times per day  . hydroxypropyl methylcellulose / hypromellose  1 drop Both Eyes TID  . latanoprost  1 drop Both Eyes QHS  . memantine  28 mg Oral Daily  . mirtazapine  15 mg Oral QHS  . multivitamin  5 mL Oral Daily  . mupirocin ointment  1 application Nasal BID  . nitroGLYCERIN  0.2 mg Transdermal Daily  . piperacillin-tazobactam (ZOSYN)  IV  2.25 g Intravenous 3 times per day  . saccharomyces boulardii  250 mg Oral BID  . senna  1 tablet Oral QHS  . sodium chloride  3 mL Intravenous Q12H  . vancomycin  1,000 mg Intravenous Q48H  . vitamin C  500 mg Oral BID  . zinc sulfate  220 mg Oral Daily   Continuous Infusions:   PRN Meds:.acetaminophen, albuterol, HYDROcodone-acetaminophen, polyethylene glycol, RESOURCE THICKENUP CLEAR, sodium chloride  DVT Prophylaxis   Heparin -  Lab Results  Component Value Date   PLT 270 07/30/2015    Antibiotics    Anti-infectives    Start     Dose/Rate Route Frequency Ordered Stop   07/27/15 0900  vancomycin (VANCOCIN) IVPB 1000 mg/200 mL premix     1,000 mg 200 mL/hr over 60 Minutes Intravenous Every 48 hours 07/27/15 0830     07/26/15 0300  piperacillin-tazobactam (ZOSYN) IVPB 2.25 g     2.25 g 100 mL/hr over 30 Minutes Intravenous 3 times per day 07/26/15 0257     07/26/15 0200  piperacillin-tazobactam  (ZOSYN) injection 2.25 g  Status:  Discontinued     2.25 g Intramuscular Every 8 hours 07/26/15 0052 07/26/15 0257   07/26/15 0200  linezolid (ZYVOX) tablet 600 mg  Status:  Discontinued     600 mg Oral 2 times daily 07/26/15 0052 07/27/15 0808          Objective:   Filed Vitals:   07/30/15 0100 07/30/15 0145 07/30/15 0430 07/30/15 0738  BP: 129/58 132/63 155/69 157/67  Pulse: 56 52 56 55  Temp: 97.8 F (36.6 C) 97.3 F (36.3 C)  97.5 F (36.4 C)  TempSrc: Oral Oral  Oral  Resp: 14 15 14 13   Height:      Weight:      SpO2: 98% 99% 100%  98%    Wt Readings from Last 3 Encounters:  07/26/15 160 lb (72.576 kg)  03/24/15 165 lb (74.844 kg)  02/24/15 167 lb (75.751 kg)     Intake/Output Summary (Last 24 hours) at 07/30/15 1557 Last data filed at 07/30/15 1309  Gross per 24 hour  Intake    584 ml  Output    400 ml  Net    184 ml     Physical Exam more awake and coherent today. Supple Neck,No JVD,  Symmetrical Chest wall movement, Good air movement bilaterally,  RRR,No Gallops,Rubs or new Murmurs,  +ve B.Sounds, Abd Soft, No tenderness, No organomegaly appriciated,  left BKA, right heel pressure ulcer, unstageble, stage III decubitus sacral ulcer with minimal discharge   Data Review   Micro Results Recent Results (from the past 240 hour(s))  Culture, blood (x 2)     Status: None (Preliminary result)   Collection Time: 07/26/15  1:59 AM  Result Value Ref Range Status   Specimen Description BLOOD RIGHT HAND  Final   Special Requests IN PEDIATRIC BOTTLE 3CC  Final   Culture NO GROWTH 4 DAYS  Final   Report Status PENDING  Incomplete  MRSA PCR Screening     Status: Abnormal   Collection Time: 07/26/15  2:02 AM  Result Value Ref Range Status   MRSA by PCR POSITIVE (A) NEGATIVE Final    Comment:        The GeneXpert MRSA Assay (FDA approved for NASAL specimens only), is one component of a comprehensive MRSA colonization surveillance program. It is  not intended to diagnose MRSA infection nor to guide or monitor treatment for MRSA infections. RESULT CALLED TO, READ BACK BY AND VERIFIED WITH: ZEE MAHMOUD@[id number]  07/26/15 MKELLY   Culture, blood (x 2)     Status: None (Preliminary result)   Collection Time: 07/26/15  2:06 AM  Result Value Ref Range Status   Specimen Description BLOOD LEFT HAND  Final   Special Requests IN PEDIATRIC BOTTLE 3CC  Final   Culture NO GROWTH 4 DAYS  Final   Report Status PENDING  Incomplete    Radiology Reports Ct Lumbar Spine Wo Contrast  07/27/2015   CLINICAL DATA:  Extreme back pain, in addition to pressure ulcer. History of dementia, chronic kidney disease, below-knee amputation.  EXAM: CT LUMBAR SPINE WITHOUT CONTRAST  TECHNIQUE: Multidetector CT imaging of the lumbar spine was performed without intravenous contrast administration. Multiplanar CT image reconstructions were also generated.  COMPARISON:  None.  FINDINGS: Lumbar vertebral bodies and posterior elements are intact and aligned and maintenance of lumbar lordosis. Using the reference level of the last well-formed intervertebral disc as L5-S1, moderate L2-3 through L4-5 disc height loss, intradiscal calcification. Vacuum disc at L3-4. Probable auto body intern arthrodesis at L2-3. Mild endplate spurring M5-7 through L4-5. Mild osteopenia without destructive bony lesions. Small probable enchondroma RIGHT iliac bone. Status post bilateral hip total arthroplasties.  Partially imaged small pleural effusions. 3.3 cm LEFT renal cyst. Vascular calcifications versus nonobstructing RIGHT nephrolithiasis. Large amount of stool distending the sigmoid colon and rectum.  Small broad-based disc osteophyte complex at L2-3 results in mild canal stenosis. Mild to moderate RIGHT L4-5 and L5-S1 osseous neural foraminal narrowing.  IMPRESSION: No acute lumbar spine fracture, malalignment.  Mild canal stenosis at L2-3. Mild to moderate RIGHT L4-5 and L5-S1 neural foraminal  narrowing.   Electronically Signed   By: Awilda Metro M.D.   On: 07/27/2015 23:00   Mr Pelvis Wo Contrast  07/30/2015   CLINICAL DATA:  Patient with baseline dementia, with worsening mental status secondary to hypernatremia, as well as metabolic secondary to infection. Acute renal failure superimposed on stage 4 chronic kidney disease. Decubitus ulcer of buttock.  EXAM: MRI PELVIS WITHOUT CONTRAST  TECHNIQUE: Multiplanar multisequence MR imaging of the pelvis was performed. No intravenous contrast was administered.  COMPARISON:  None.  FINDINGS: There are bilateral total hip arthroplasties with susceptibility artifact partially obscuring the adjacent soft tissue and osseous structures.  There is no focal marrow signal abnormality. There is no acute fracture or dislocation. The sacroiliac joints are normal. There is no sacroiliac joint effusion.  There is a sacral decubitus ulcer with mild subcutaneous edema. The underlying sacrum demonstrates no cortical destruction, periosteal reaction or marrow signal abnormality. There is chronic malalignment of the coccyx.  There is no focal fluid collection or hematoma.  There is mild generalized anasarca.  There is no pelvic free fluid. There is rectal fecal impaction with mild rectal wall thickening. There is no inguinal hernia. There is no inguinal lymphadenopathy.  There is mild edema within the left gluteus maximus muscle particularly along the medial aspect.  IMPRESSION: 1. No evidence of sacral osteomyelitis. 2. Mild edema within the left gluteus maximus muscle particularly along the medial aspect which may reflect mild myositis secondary to an infectious or inflammatory etiology. There is no drainable fluid collection or hematoma.   Electronically Signed   By: Elige Ko   On: 07/30/2015 08:01   US Renal  07/26/2015   CLINICAL DATA:  81 year old male with acute renal insufficiency.  EXAM: RENAL / URINARY TRACT ULTRASOUND COMPLETE  COMPARISON:  None.   FINDINGS: Right Kidney:  Length: 11 cm. Diffuse increased renal echogenicity compatible with underlying medical renal disease. Clinical correlation is recommended. A 2.2 x 1.5 x 1.9 cm upper pole cyst is seen. There is no hydronephrosis. Evaluation for stone or underlying solid lesion is limited due to increased echogenicity.  Left Kidney:  Length: 11 cm. There are multiple cysts in the left kidney measuring up to 4.1 x 4.4 x 3.9 cm in the upper pole. There is diffuse increased renal parenchymal echogenicity. No hydronephrosis.  Bladder:  A Foley catheter is noted within the partially distended urinary bladder.  IMPRESSION: Increased bilateral renal echogenicity likely related side and aligned medical renal disease. Clinical correlation is recommended.  No hydronephrosis.  Bilateral renal cysts.   Electronically Signed   By: Elgie Collard M.D.   On: 07/26/2015 04:27   Ir Fluoro Guide Cv Line Right  07/24/2015   CLINICAL DATA:  Urinary tract infection, needs venous access for antibiotics  EXAM: PICC PLACEMENT WITH ULTRASOUND AND FLUOROSCOPY  FLUOROSCOPY TIME:  12 seconds  TECHNIQUE: After informed consent via telephone from family was obtained, patient was placed in the supine position on angiographic table. Patency of the right brachial vein was confirmed with ultrasound with image documentation. An appropriate skin site was determined. Skin site was marked. Region was prepped using maximum barrier technique including cap and mask, sterile gown, sterile gloves, large sterile sheet, and Chlorhexidine as cutaneous antisepsis. The region was infiltrated locally with 1% lidocaine. Under real-time ultrasound guidance, the right brachial vein was accessed with a 21 gauge micropuncture needle; the needle tip within the vein was confirmed with ultrasound image documentation. Needle exchanged over a 018 guidewire for a peel-away sheath, through which a 5-French single-lumen power injectable PICC trimmed to 38cm was  advanced, positioned with its tip near the cavoatrial  junction. Spot chest radiograph confirms appropriate catheter position. Catheter was flushed per protocol and secured externally with 0 Prolene suture. The patient tolerated procedure well.  COMPLICATIONS: COMPLICATIONS none  IMPRESSION: 1. Technically successful five [place] single lumen power injectable PICC placement   Electronically Signed   By: Corlis Leak M.D.   On: 07/24/2015 15:34   Ir US Guide Vasc Access Right  07/24/2015   CLINICAL DATA:  Urinary tract infection, needs venous access for antibiotics  EXAM: PICC PLACEMENT WITH ULTRASOUND AND FLUOROSCOPY  FLUOROSCOPY TIME:  12 seconds  TECHNIQUE: After informed consent via telephone from family was obtained, patient was placed in the supine position on angiographic table. Patency of the right brachial vein was confirmed with ultrasound with image documentation. An appropriate skin site was determined. Skin site was marked. Region was prepped using maximum barrier technique including cap and mask, sterile gown, sterile gloves, large sterile sheet, and Chlorhexidine as cutaneous antisepsis. The region was infiltrated locally with 1% lidocaine. Under real-time ultrasound guidance, the right brachial vein was accessed with a 21 gauge micropuncture needle; the needle tip within the vein was confirmed with ultrasound image documentation. Needle exchanged over a 018 guidewire for a peel-away sheath, through which a 5-French single-lumen power injectable PICC trimmed to 38cm was advanced, positioned with its tip near the cavoatrial junction. Spot chest radiograph confirms appropriate catheter position. Catheter was flushed per protocol and secured externally with 0 Prolene suture. The patient tolerated procedure well.  COMPLICATIONS: COMPLICATIONS none  IMPRESSION: 1. Technically successful five [place] single lumen power injectable PICC placement   Electronically Signed   By: Corlis Leak M.D.   On: 07/24/2015  15:34   Dg Foot 2 Views Right  07/26/2015   CLINICAL DATA:  Right lateral heel region ulcer  EXAM: RIGHT FOOT - 2 VIEW  COMPARISON:  None.  FINDINGS: Two views of the right foot submitted. No acute fracture or subluxation. Extensive osteopenia. Atherosclerotic vascular calcifications are noted. No bone destruction to suggest osteomyelitis.  IMPRESSION: Diffuse osteopenia. No acute fracture or subluxation. No definite evidence of osteomyelitis.   Electronically Signed   By: Natasha Mead M.D.   On: 07/26/2015 16:19     CBC  Recent Labs Lab 07/25/15 2227 07/26/15 0443 07/28/15 0700 07/29/15 0541 07/30/15 0630  WBC 15.8* 16.1* 10.1 10.0 10.8*  HGB 8.2* 8.3* 8.0* 7.3* 8.5*  HCT 28.1* 29.0* 26.6* 24.4* 27.7*  PLT 344 341 273 270 270  MCV 87.0 87.1 84.7 84.4 83.4  MCH 25.4* 24.9* 25.5* 25.3* 25.6*  MCHC 29.2* 28.6* 30.1 29.9* 30.7  RDW 17.5* 17.5* 17.3* 17.2* 16.9*  LYMPHSABS 1.8  --   --   --   --   MONOABS 1.3*  --   --   --   --   EOSABS 0.6  --   --   --   --   BASOSABS 0.1  --   --   --   --     Chemistries   Recent Labs Lab 07/26/15 0443  07/27/15 0400 07/27/15 0644 07/27/15 1650 07/28/15 0700 07/28/15 1424 07/29/15 0541 07/30/15 0630  NA 161*  < > 154* 154* 149* 150* 147* 145 142  K 3.6  < > 3.4* 3.4*  --  3.0*  --  3.3* 3.3*  CL >130*  < > 125* 124*  --  120*  --  115* 114*  CO2 24  < > 23 23  --  23  --  24 22  GLUCOSE 144*  < > 143* 138*  --  124*  --  119* 81  BUN 55*  < > 53* 47*  --  48*  --  44* 42*  CREATININE 3.41*  < > 2.91* 3.10*  --  2.87*  --  2.80* 2.71*  CALCIUM 9.6  < > 9.8 9.8  --  9.8  --  9.3 9.2  MG  --   --   --   --   --   --   --  1.9  --   AST 20  --   --   --   --   --   --   --   --   ALT 11*  --   --   --   --   --   --   --   --   ALKPHOS 71  --   --   --   --   --   --   --   --   BILITOT 0.2*  --   --   --   --   --   --   --   --   < > = values in this interval not  displayed. ------------------------------------------------------------------------------------------------------------------ estimated creatinine clearance is 17.3 mL/min (by C-G formula based on Cr of 2.71). ------------------------------------------------------------------------------------------------------------------ No results for input(s): HGBA1C in the last 72 hours. ------------------------------------------------------------------------------------------------------------------ No results for input(s): CHOL, HDL, LDLCALC, TRIG, CHOLHDL, LDLDIRECT in the last 72 hours. ------------------------------------------------------------------------------------------------------------------ No results for input(s): TSH, T4TOTAL, T3FREE, THYROIDAB in the last 72 hours.  Invalid input(s): FREET3 ------------------------------------------------------------------------------------------------------------------ No results for input(s): VITAMINB12, FOLATE, FERRITIN, TIBC, IRON, RETICCTPCT in the last 72 hours.  Coagulation profile  Recent Labs Lab 07/26/15 0206  INR 1.33    No results for input(s): DDIMER in the last 72 hours.  Cardiac Enzymes No results for input(s): CKMB, TROPONINI, MYOGLOBIN in the last 168 hours.  Invalid input(s): CK ------------------------------------------------------------------------------------------------------------------ Invalid input(s): POCBNP     Time Spent in minutes   35 minutes   Quiana Cobaugh MD PhD on 07/30/2015 at 3:57 PM  Between 7am to 7pm - Pager - [phone]  After 7pm go to [email or web address] - password [facility]  [facility]   Office  [phone]

## 2015-07-30 NOTE — Progress Notes (Signed)
Speech Language Pathology Treatment: Dysphagia  Patient Details Name: Joseph Ponce MRN: [id number] DOB: 1924-02-16 Today's Date: 07/30/2015 Time: 1040-1050 SLP Time Calculation (min) (ACUTE ONLY): 10 min  Assessment / Plan / Recommendation Clinical Impression  Pt has good oral acceptance and transit with liquids and purees, although throat clearing is noted with advanced trials of thin liquids despite assistance from SLP for smaller, controlled sips. Per MD order, pt has been on a pureed diet at SNF - question what liquids this includes. Would continue with nectar thick liquids with brief SLP f/u to determine if advancement to thin liquids is an option.   HPI Other Pertinent Information: 81 y.o. male with PMH of dementia, hypertension, hyperlipidemia, depression, osteoporosis, stroke, dysphasia, PVD, COPD, BKA, renal artery stenosis,CKD-IV, who presents abnormal lab from SNF. No recent CXR. Found to have acute renal failure superimposed on stage 4 chronic kidney disease. MBS x 2 in 2007, results unavailable (MBS in imaging for 06/2015- no results found).   Pertinent Vitals Pain Assessment: Faces Faces Pain Scale: No hurt  SLP Plan  Continue with current plan of care    Recommendations Diet recommendations: Dysphagia 1 (puree);Nectar-thick liquid Liquids provided via: Cup;Straw Medication Administration: Crushed with puree Supervision: Patient able to self feed;Full supervision/cueing for compensatory strategies Compensations: Slow rate;Small sips/bites;Check for pocketing Postural Changes and/or Swallow Maneuvers: Seated upright 90 degrees;Upright 30-60 min after meal       Oral Care Recommendations: Oral care BID Follow up Recommendations: Skilled Nursing facility Plan: Continue with current plan of care    Maxcine Ham, M.A. CCC-SLP [phone]  Maxcine Ham 07/30/2015, 4:15 PM

## 2015-07-31 DIAGNOSIS — R627 Adult failure to thrive: Secondary | ICD-10-CM | POA: Insufficient documentation

## 2015-07-31 DIAGNOSIS — Z515 Encounter for palliative care: Secondary | ICD-10-CM | POA: Insufficient documentation

## 2015-07-31 DIAGNOSIS — N179 Acute kidney failure, unspecified: Secondary | ICD-10-CM | POA: Insufficient documentation

## 2015-07-31 DIAGNOSIS — L98429 Non-pressure chronic ulcer of back with unspecified severity: Secondary | ICD-10-CM | POA: Insufficient documentation

## 2015-07-31 DIAGNOSIS — Z7189 Other specified counseling: Secondary | ICD-10-CM | POA: Insufficient documentation

## 2015-07-31 LAB — BASIC METABOLIC PANEL
ANION GAP: 7 (ref 5–15)
BUN: 36 mg/dL — AB (ref 6–20)
CHLORIDE: 114 mmol/L — AB (ref 101–111)
CO2: 22 mmol/L (ref 22–32)
Calcium: 9.3 mg/dL (ref 8.9–10.3)
Creatinine, Ser: 2.66 mg/dL — ABNORMAL HIGH (ref 0.61–1.24)
GFR calc Af Amer: 23 mL/min — ABNORMAL LOW (ref >60)
GFR calc non Af Amer: 20 mL/min — ABNORMAL LOW (ref >60)
GLUCOSE: 75 mg/dL (ref 65–99)
POTASSIUM: 3.4 mmol/L — AB (ref 3.5–5.1)
Sodium: 143 mmol/L (ref 135–145)

## 2015-07-31 LAB — CULTURE, BLOOD (ROUTINE X 2)
Culture: NO GROWTH
Culture: NO GROWTH

## 2015-07-31 LAB — CBC WITH DIFFERENTIAL/PLATELET
Basophils Absolute: 0.1 10*3/uL (ref 0.0–0.1)
Basophils Relative: 1 %
EOS PCT: 2 %
Eosinophils Absolute: 0.3 10*3/uL (ref 0.0–0.7)
HEMATOCRIT: 28.9 % — AB (ref 39.0–52.0)
HEMOGLOBIN: 9.4 g/dL — AB (ref 13.0–17.0)
LYMPHS ABS: 1.6 10*3/uL (ref 0.7–4.0)
LYMPHS PCT: 13 %
MCH: 26.6 pg (ref 26.0–34.0)
MCHC: 32.5 g/dL (ref 30.0–36.0)
MCV: 81.6 fL (ref 78.0–100.0)
Monocytes Absolute: 1.2 10*3/uL — ABNORMAL HIGH (ref 0.1–1.0)
Monocytes Relative: 10 %
NEUTROS ABS: 8.7 10*3/uL — AB (ref 1.7–7.7)
NEUTROS PCT: 74 %
Platelets: 257 10*3/uL (ref 150–400)
RBC: 3.54 MIL/uL — AB (ref 4.22–5.81)
RDW: 16.7 % — ABNORMAL HIGH (ref 11.5–15.5)
WBC: 11.8 10*3/uL — AB (ref 4.0–10.5)

## 2015-07-31 LAB — TYPE AND SCREEN
ABO/RH(D): O POS
ANTIBODY SCREEN: NEGATIVE
UNIT DIVISION: 0

## 2015-07-31 MED ORDER — OLANZAPINE 5 MG PO TABS
5.0000 mg | ORAL_TABLET | Freq: Every day | ORAL | Status: DC
  Administered 2015-07-31: 5 mg via ORAL
  Filled 2015-07-31 (×2): qty 1

## 2015-07-31 MED ORDER — HYDRALAZINE HCL 20 MG/ML IJ SOLN: 10.0000 mg | Freq: Three times a day (TID) | INTRAMUSCULAR | Status: DC | PRN

## 2015-07-31 NOTE — Consult Note (Signed)
Consultation Note Date: 07/31/2015   Patient Name: Joseph Ponce  DOB: 09/05/1924  MRN: [id number]  Age / Sex: 81 y.o., male   PCP: Maxwell Caul, MD Referring Physician: Albertine Grates, MD  Reason for Consultation: Establishing goals of care and Psychosocial/spiritual support  Palliative Care Assessment and Plan Summary of Established Goals of Care and Medical Treatment Preferences   Clinical Assessment/Narrative: Joseph Ponce is lying quietly in bed today.  He tells me that he can see some.  He is unable to tell me the year or president, he does however eat some apple sauce and take tylenol. I ask if his heart were to naturally stop, if it would be ok for him to pass on, and he tells me yes, that this doesn't worry him.   Call to [facility], niece and HCPOA.  She tells me that she will be able to come see "[name]" this weekend.  She talks about his pressure wound, and his decreased appetite.  [name] shares with me that her father in law also died with dementia, and she is aware of what to expect with his decline.  She and I review the MOST form online/over the phone.  We talk about the concept of "allow a natural death" and Do not treat the next infection".  [name] tells me that she cares more about Joseph Ponce's quality of live than his quantity of life.  [name] is agreeable to have Joseph Ponce return to [facility] with Hospice, understanding that when/if he needs more care in the future, having Hospice in place will ease any transition.  [name] tells me that she is planning to visit this weekend, as she has to work and has to drive through [place] and is concerned about unrest there at this time.  I give her the number for PMT and encourage her to call with any concerns.  I will leave a MOST form for her to complete.   Contacts/Participants in Discussion: Primary Decision Maker: Joseph Ponce has dementia and relies on his niece, [name] to make his decisions.    HCPOA: yes:  Niece, Data processing manager   Code Status/Advance Care Planning:  DNR  NO tube feedings  NO surgeries  MOST form reviewed.   We discussed the concept of Do not re hospitalize and allow a natural death.   Symptom Management:   Tylenol 650 mg PO Q 6 hours PRN.  Norco 5/325 (2 tabs) PO Q 6 hours PRN.   Neurontin 100 mg PO BID  Palliative Prophylaxis: Miralax 17 g PO PRN.    Psycho-social/Spiritual:   Support System: Lives at [facility].  Niece/HCPOA [address] lives in [place], [place].   Desire for further Chaplaincy support:no  Prognosis: < 6 months  Discharge Planning:  Skilled Nursing Facility with Hospice       Chief Complaint:  Abnormal lab.  History of Present Illness:   Joseph Ponce is a 81 y.o. male with PMH of dementia, hypertension, hyperlipidemia, depression, osteoporosis, stroke, dysphasia, PVD, COPD, renal artery stenosis,CKD-IV, who presents to the ED d/t abnormal lab results at residential SNF.  Patient has dementia and is unable to provide accurate medical history, therefore, most of the history was obtained by discussing the case with ED and per EMS report. It seems that patient was sent from [facility] for elevated creatinine and BUN. He does not have complaints. Of note, patient was started with IV linezolid and zosyn for right foot heel infection and decubitus infection via PICC  line on 07/24/15 (supposed to take for two weeks). Patient has pain over foot on palpation.  In ED, patient was found to have creatinine increased from 1.77 on 05/04/14 to 3.50, BUN 60, hyponatremia with a sodium of 159, WBC 15.8, bradycardia, temperature normal. Patient is admitted to inpatient for further reevaluation and treatment.  His labs have improved, but he has CKD stage 4 at baseline.   Primary Diagnoses  Present on Admission:  . Hypernatremia . Renal artery stenosis . Osteoporosis . Hypertension . Hyperlipemia . Glaucoma (increased eye pressure) . Depression .  Dementia without behavioral disturbance . COPD (chronic obstructive pulmonary disease) . Chronic pain syndrome . Bradycardia, sinus . Anemia . Acute renal failure superimposed on stage 4 chronic kidney disease . Dehydration . Infection of right foot . Decubitus ulcer of buttock, stage 3 . Protein-calorie malnutrition  Palliative Review of Systems: Joseph Ponce denies pain, dyspnea, NV or anxiety, but he has been hitting and aggressive with staff which suggest, to me, pain.  He would benefit from scheduled tylenol.  I have reviewed the medical record, interviewed the patient and family, and examined the patient. The following aspects are pertinent.  Past Medical History  Diagnosis Date  . Dementia   . Muscle weakness   . Osteoporosis   . Alzheimer disease   . Failure to thrive in adult   . Dementia   . CVA (cerebral vascular accident)     speech and language d/o deficits  . Weakness   . Hyperlipemia   . Dysphagia   . Anemia   . PVD (peripheral vascular disease)   . COPD (chronic obstructive pulmonary disease)   . Glaucoma (increased eye pressure)   . Hypertension   . S/P BKA (below knee amputation) unilateral 01/21/2006    left  . Renal artery stenosis   . CKD (chronic kidney disease), stage IV    Social History   Social History  . Marital Status: Widowed    Spouse Name: N/A  . Number of Children: N/A  . Years of Education: N/A   Social History Main Topics  . Smoking status: Former Smoker    Quit date: 04/28/2015  . Smokeless tobacco: None  . Alcohol Use: No  . Drug Use: No  . Sexual Activity: No   Other Topics Concern  . None   Social History Narrative   History reviewed. No pertinent family history. Scheduled Meds: . sodium chloride   Intravenous Once  . antiseptic oral rinse  7 mL Mouth Rinse BID  . aspirin  81 mg Oral Daily  . brimonidine  1 drop Both Eyes BID  . calcium-vitamin D  1 tablet Oral BID WC  . collagenase   Topical Daily  . desmopressin   1 spray Nasal TID  . dorzolamide  1 drop Both Eyes BID  . feeding supplement (NEPRO CARB STEADY)  237 mL Oral BID BM  . feeding supplement (PRO-STAT SUGAR FREE 64)  30 mL Oral BID  . gabapentin  100 mg Oral BID  . heparin  5,000 Units Subcutaneous 3 times per day  . hydrALAZINE  50 mg Oral 3 times per day  . hydroxypropyl methylcellulose / hypromellose  1 drop Both Eyes TID  . latanoprost  1 drop Both Eyes QHS  . memantine  28 mg Oral Daily  . mirtazapine  15 mg Oral QHS  . multivitamin  5 mL Oral Daily  . nitroGLYCERIN  0.2 mg Transdermal Daily  . OLANZapine  5 mg Oral  Daily  . piperacillin-tazobactam (ZOSYN)  IV  2.25 g Intravenous 3 times per day  . saccharomyces boulardii  250 mg Oral BID  . senna  1 tablet Oral QHS  . sodium chloride  3 mL Intravenous Q12H  . vancomycin  1,000 mg Intravenous Q48H  . vitamin C  500 mg Oral BID  . zinc sulfate  220 mg Oral Daily   Continuous Infusions:  PRN Meds:.acetaminophen, albuterol, HYDROcodone-acetaminophen, ondansetron **OR** ondansetron (ZOFRAN) IV, polyethylene glycol, RESOURCE THICKENUP CLEAR, sodium chloride Medications Prior to Admission:  Prior to Admission medications   Medication Sig Start Date End Date Taking? Authorizing Provider  acetaminophen (TYLENOL) 500 MG tablet Take 1,000 mg by mouth 2 (two) times daily.   Yes Historical Provider, MD  Amino Acids-Protein Hydrolys (FEEDING SUPPLEMENT, PRO-STAT SUGAR FREE 64,) LIQD Take 30 mLs by mouth 2 (two) times daily.   Yes Historical Provider, MD  amLODipine (NORVASC) 10 MG tablet Take 10 mg by mouth daily.   Yes Historical Provider, MD  aspirin 81 MG chewable tablet Chew 81 mg by mouth daily.   Yes Historical Provider, MD  brimonidine (ALPHAGAN P) 0.1 % SOLN Place 1 drop into both eyes 2 (two) times daily.   Yes Historical Provider, MD  calcium-vitamin D (OSCAL WITH D) 500-200 MG-UNIT per tablet Take 1 tablet by mouth 2 (two) times daily.   Yes Historical Provider, MD  desmopressin  (DDAVP) 0.01 % nasal solution Place 10 mcg into the nose 3 (three) times daily.   Yes Historical Provider, MD  dorzolamide (TRUSOPT) 2 % ophthalmic solution Place 1 drop into both eyes 2 (two) times daily.   Yes Historical Provider, MD  feeding supplement, RESOURCE BREEZE, (RESOURCE BREEZE) LIQD Take 1 Container by mouth 2 (two) times daily between meals.   Yes Historical Provider, MD  gabapentin (NEURONTIN) 100 MG capsule Take 100 mg by mouth 2 (two) times daily.   Yes Historical Provider, MD  hydrALAZINE (APRESOLINE) 50 MG tablet Take 50 mg by mouth 3 (three) times daily.   Yes Historical Provider, MD  HYDROcodone-acetaminophen (NORCO/VICODIN) 5-325 MG per tablet Take one tablet by mouth twice daily for chronic pain. Do not exceed 4gm of Tylenol in 24 hours. 07/07/15  Yes Tiffany L Reed, DO  hydroxypropyl methylcellulose (ISOPTO TEARS) 2.5 % ophthalmic solution Place 1 drop into both eyes 3 (three) times daily.   Yes Historical Provider, MD  linezolid (ZYVOX) 600 MG tablet Take 600 mg by mouth 2 (two) times daily.   Yes Historical Provider, MD  memantine (NAMENDA XR) 28 MG CP24 24 hr capsule Take 28 mg by mouth daily.   Yes Historical Provider, MD  metoCLOPramide (REGLAN) 5 MG tablet Take 5 mg by mouth 3 (three) times daily before meals.   Yes Historical Provider, MD  mirtazapine (REMERON) 15 MG tablet Take 15 mg by mouth at bedtime.   Yes Historical Provider, MD  Multiple Vitamins-Minerals (CERTA-VITE PO) Take 1 tablet by mouth daily.   Yes Historical Provider, MD  nitroGLYCERIN (NITRODUR - DOSED IN MG/24 HR) 0.2 mg/hr Place 1 patch onto the skin daily.   Yes Historical Provider, MD  piperacillin-tazobactam (ZOSYN) 2.25 (2-0.25) G injection Inject 2.25 g into the muscle every 8 (eight) hours. 2 week regimen starting at 6pm on 9/15   Yes Historical Provider, MD  polyethylene glycol (MIRALAX / GLYCOLAX) packet Take 17 g by mouth daily.   Yes Historical Provider, MD  saccharomyces boulardii (FLORASTOR)  250 MG capsule Take 250 mg by mouth 2 (  two) times daily.   Yes Historical Provider, MD  senna (SENOKOT) 8.6 MG TABS tablet Take 1 tablet by mouth at bedtime.   Yes Historical Provider, MD  Travoprost, BAK Free, (TRAVATAN) 0.004 % SOLN ophthalmic solution Place 1 drop into both eyes at bedtime.   Yes Historical Provider, MD  vitamin C (ASCORBIC ACID) 500 MG tablet Take 500 mg by mouth 2 (two) times daily.   Yes Historical Provider, MD  zinc sulfate 220 MG capsule Take 220 mg by mouth daily.   Yes Historical Provider, MD  denosumab (PROLIA) 60 MG/ML SOLN injection Inject 60 mg into the skin every 6 (six) months.     Historical Provider, MD  magnesium hydroxide (MILK OF MAGNESIA) 400 MG/5ML suspension Take 30 mLs by mouth daily as needed. For constipation    Historical Provider, MD   No Known Allergies CBC:    Component Value Date/Time   WBC 11.8* 07/31/2015 0610   HGB 9.4* 07/31/2015 0610   HCT 28.9* 07/31/2015 0610   PLT 257 07/31/2015 0610   MCV 81.6 07/31/2015 0610   NEUTROABS 8.7* 07/31/2015 0610   LYMPHSABS 1.6 07/31/2015 0610   MONOABS 1.2* 07/31/2015 0610   EOSABS 0.3 07/31/2015 0610   BASOSABS 0.1 07/31/2015 0610   Comprehensive Metabolic Panel:    Component Value Date/Time   NA 143 07/31/2015 0610   K 3.4* 07/31/2015 0610   CL 114* 07/31/2015 0610   CO2 22 07/31/2015 0610   BUN 36* 07/31/2015 0610   CREATININE 2.66* 07/31/2015 0610   GLUCOSE 75 07/31/2015 0610   CALCIUM 9.3 07/31/2015 0610   AST 20 07/26/2015 0443   ALT 11* 07/26/2015 0443   ALKPHOS 71 07/26/2015 0443   BILITOT 0.2* 07/26/2015 0443   PROT 6.1* 07/26/2015 0443   ALBUMIN 1.7* 07/26/2015 0443    Physical Exam: Vital Signs: BP 179/83 mmHg  Pulse 65  Temp(Src) 98.5 F (36.9 C) (Oral)  Resp 18  Ht 5\' 7"  (1.702 m)  Wt 76.295 kg (168 lb 3.2 oz)  BMI 26.34 kg/m2  SpO2 99% SpO2: SpO2: 99 % O2 Device: O2 Device: Not Delivered O2 Flow Rate:   Intake/output summary:  Intake/Output Summary (Last 24  hours) at 07/31/15 1135 Last data filed at 07/31/15 0900  Gross per 24 hour  Intake    280 ml  Output   1550 ml  Net  -1270 ml   LBM: Last BM Date:  (9/21) Baseline Weight: Weight: 77.111 kg (170 lb) Most recent weight: Weight: 76.295 kg (168 lb 3.2 oz)  Exam Findings:  Constitutional:  Elderly frail, lying in bed.  HEENT:  Temporal wasting.  Resp:  Even and non labored GI: Abd soft, flat, non tender.  Skin: warm and dry, pressure ulcers to right heel and sacrum.  Musc: left BKA.          Palliative Performance Scale: 30% at best.               Additional Data Reviewed: Recent Labs     07/30/15  0630  07/31/15  0610  WBC  10.8*  11.8*  HGB  8.5*  9.4*  PLT  270  257  NA  142  143  BUN  42*  36*  CREATININE  2.71*  2.66*     Time In: 1005 Time Out:  1145 Time Total: 95 minutes Greater than 50%  of this time was spent counseling and coordinating care related to the above assessment and plan. GOC discussion shared with  nursing staff, CM, SW and Dr. Roda Shutters.   Signed by: Katheran Awe, NP  Katheran Awe, NP  07/31/2015, 11:35 AM  Please contact Palliative Medicine Team phone at [phone] for questions and concerns.

## 2015-07-31 NOTE — Progress Notes (Signed)
Patient Demographics  Joseph Ponce, is a 81 y.o. male, DOB - 12-09-1923, ZOX:[id number]  Admit date - 07/25/2015   Admitting Physician Lorretta Harp, MD  Outpatient Primary MD for the patient is [name], MD  LOS - 5   Chief Complaint  Patient presents with  . Abnormal Lab         Subjective:   Joseph Ponce does not cooperating with exam, does not want to take meds either, he seems more coherent and calm this am, he knows he is in [place], but not able to state his birth date , think he is at his nursing home.  Assessment & Plan    Principal Problem:   Acute renal failure superimposed on stage 4 chronic kidney disease Active Problems:   Dementia without behavioral disturbance   Anemia   Renal artery stenosis   S/P BKA (below knee amputation) unilateral   Hypertension   Glaucoma (increased eye pressure)   COPD (chronic obstructive pulmonary disease)   Hyperlipemia   Osteoporosis   Chronic pain syndrome   Bradycardia, sinus   Depression   Hypernatremia   Dehydration   Infection of right foot   Decubitus ulcer of buttock, stage 3   Protein-calorie malnutrition   Acute renal failure syndrome   Palliative care encounter   DNR (do not resuscitate) discussion  Acute on chronic renal failure - likely multifactorial including dehydration, abx side effect, skin infection - Continue with IV fluid, patient had indwelling Foley catheter recently inserted at the nursing home. - improving with IV fluids , Slight creatinine 1.7-2, peaked at 3.4, continue to improved, good urine output  Hypernatremia, with h/o neurogenic diabetes insipitus (per SNF note, has been on DDVAP chronically for years) - DDAVP continued and patient also recieved  D5W at 125 mL/h,   later with D5 half-normal saline at 75 mL per hour , this was stopped on 9/19, na wnl since 9/20, continue monitor  Acute  encephalopathy - Patient with baseline dementia, with worsening mental status secondary to hypernatremia, as well as metabolic secondary to infection -still refusing meds, but seems more alert and conversant.   Right foot and sacral decubitus ulcer infection ,  - per SNF notes patient has been bed bound for a few months, then developed ulcer, was treated conservatively with topical enzymatic debridement and oral abx, however, with worsening symptom, patient was started on IV linezolid and Zosyn at SNF mid september - Follow on blood cultures : No growth to date - - IV linezolid discontinued since admission, he is started on IV vancomycin from 9/18and zosyn from 9/17 -  Right foot x-ray with no evidence of osteomyelitis, MRI of sacral spine no osteomyelitis, does has left gluteus muscle edema, surgery consulted, not a candidate for surgery, patient does not cooperate with bedside debridement, conservative management for now. Appreciate wound care input.  Dementia without behavioral disturbance: -continue Memantine  Anemia: -  hgb dropped from 11.8 (this is after PRBC transfusion) on 04/12/14 to 8.2 on admission, dropped today to 7.3, and this is most likely dilutional effect as an IV fluids, no evidence of GI bleed, FOBT negative, will transfuse 1 unit PRBC today.  -  FOBT negative - Iron 28, ferritin 608, folate and B-12  within normal limit, workup significant for anemia of chronic illness, and renal insufficiency .  Hypertension: -Bp elevated due to refusing oral meds, will give iv hydralazine,   Bradycardia - Symptomatic, continue telemetry monitor, no evidence of AV block on EKG.  Depression: -Continue home medications: mirtazapine  COPD (chronic obstructive pulmonary disease) - albuterol nbx prn  Hx of CVA: -ASA - Continue to hold Plavix,given his anemia, now refusing oral meds  protien-calorie malnutrition-moderate: -Continue nutrition supplement  Hypokalemia - Repleted,  recheck in a.m., check magnesium as well.  FTT: appreciate palliative care input, POA now consider hospice, and in the process deciding ivf/iv abx and whether to return to hospital after being discharged, detail please see palliative care note.  Code Status: DO NOT RESUSCITATE, discussed with POA [name] 9/18  Family Communication: spoke with [facility] 9/18  Disposition Plan: likely tomorrow SNF with hospice   Procedures  None    Consults   General surgery Palliative care   Medications  Scheduled Meds: . sodium chloride   Intravenous Once  . antiseptic oral rinse  7 mL Mouth Rinse BID  . aspirin  81 mg Oral Daily  . brimonidine  1 drop Both Eyes BID  . calcium-vitamin D  1 tablet Oral BID WC  . collagenase   Topical Daily  . desmopressin  1 spray Nasal TID  . dorzolamide  1 drop Both Eyes BID  . feeding supplement (NEPRO CARB STEADY)  237 mL Oral BID BM  . feeding supplement (PRO-STAT SUGAR FREE 64)  30 mL Oral BID  . gabapentin  100 mg Oral BID  . heparin  5,000 Units Subcutaneous 3 times per day  . hydrALAZINE  50 mg Oral 3 times per day  . hydroxypropyl methylcellulose / hypromellose  1 drop Both Eyes TID  . latanoprost  1 drop Both Eyes QHS  . memantine  28 mg Oral Daily  . mirtazapine  15 mg Oral QHS  . multivitamin  5 mL Oral Daily  . nitroGLYCERIN  0.2 mg Transdermal Daily  . OLANZapine  5 mg Oral Daily  . piperacillin-tazobactam (ZOSYN)  IV  2.25 g Intravenous 3 times per day  . saccharomyces boulardii  250 mg Oral BID  . senna  1 tablet Oral QHS  . sodium chloride  3 mL Intravenous Q12H  . vancomycin  1,000 mg Intravenous Q48H  . vitamin C  500 mg Oral BID  . zinc sulfate  220 mg Oral Daily   Continuous Infusions:   PRN Meds:.acetaminophen, albuterol, HYDROcodone-acetaminophen, ondansetron **OR** ondansetron (ZOFRAN) IV, polyethylene glycol, RESOURCE THICKENUP CLEAR, sodium chloride  DVT Prophylaxis   Heparin -  Lab Results  Component  Value Date   PLT 257 07/31/2015    Antibiotics    Anti-infectives    Start     Dose/Rate Route Frequency Ordered Stop   07/27/15 0900  vancomycin (VANCOCIN) IVPB 1000 mg/200 mL premix     1,000 mg 200 mL/hr over 60 Minutes Intravenous Every 48 hours 07/27/15 0830     07/26/15 0300  piperacillin-tazobactam (ZOSYN) IVPB 2.25 g     2.25 g 100 mL/hr over 30 Minutes Intravenous 3 times per day 07/26/15 0257     07/26/15 0200  piperacillin-tazobactam (ZOSYN) injection 2.25 g  Status:  Discontinued     2.25 g Intramuscular Every 8 hours 07/26/15 0052 07/26/15 0257   07/26/15 0200  linezolid (ZYVOX) tablet 600 mg  Status:  Discontinued     600 mg Oral 2 times daily  07/26/15 0052 07/27/15 0808          Objective:   Filed Vitals:   07/30/15 0430 07/30/15 0738 07/30/15 2107 07/31/15 0906  BP: 155/69 157/67 149/55 179/83  Pulse: 56 55 57 65  Temp:  97.5 F (36.4 C) 98.2 F (36.8 C) 98.5 F (36.9 C)  TempSrc:  Oral Oral Oral  Resp: 14 13 15 18   Height:      Weight:   168 lb 3.2 oz (76.295 kg)   SpO2: 100% 98% 99% 99%    Wt Readings from Last 3 Encounters:  07/30/15 168 lb 3.2 oz (76.295 kg)  03/24/15 165 lb (74.844 kg)  02/24/15 167 lb (75.751 kg)     Intake/Output Summary (Last 24 hours) at 07/31/15 1247 Last data filed at 07/31/15 1159  Gross per 24 hour  Intake    280 ml  Output   1900 ml  Net  -1620 ml     Physical Exam more awake and demented, conversant and calm this am Supple Neck,No JVD,  Symmetrical Chest wall movement, Good air movement bilaterally,  RRR,No Gallops,Rubs or new Murmurs,  +ve B.Sounds, Abd Soft, No tenderness, No organomegaly appriciated,  left BKA, right heel pressure ulcer, unstageable, unstageable decubitus sacral ulcer with minimal discharge   Data Review   Micro Results Recent Results (from the past 240 hour(s))  Culture, blood (x 2)     Status: None (Preliminary result)   Collection Time: 07/26/15  1:59 AM  Result Value Ref  Range Status   Specimen Description BLOOD RIGHT HAND  Final   Special Requests IN PEDIATRIC BOTTLE 3CC  Final   Culture NO GROWTH 4 DAYS  Final   Report Status PENDING  Incomplete  MRSA PCR Screening     Status: Abnormal   Collection Time: 07/26/15  2:02 AM  Result Value Ref Range Status   MRSA by PCR POSITIVE (A) NEGATIVE Final    Comment:        The GeneXpert MRSA Assay (FDA approved for NASAL specimens only), is one component of a comprehensive MRSA colonization surveillance program. It is not intended to diagnose MRSA infection nor to guide or monitor treatment for MRSA infections. RESULT CALLED TO, READ BACK BY AND VERIFIED WITH: ZEE MAHMOUD@[id number]  07/26/15 MKELLY   Culture, blood (x 2)     Status: None (Preliminary result)   Collection Time: 07/26/15  2:06 AM  Result Value Ref Range Status   Specimen Description BLOOD LEFT HAND  Final   Special Requests IN PEDIATRIC BOTTLE 3CC  Final   Culture NO GROWTH 4 DAYS  Final   Report Status PENDING  Incomplete    Radiology Reports Ct Lumbar Spine Wo Contrast  07/27/2015   CLINICAL DATA:  Extreme back pain, in addition to pressure ulcer. History of dementia, chronic kidney disease, below-knee amputation.  EXAM: CT LUMBAR SPINE WITHOUT CONTRAST  TECHNIQUE: Multidetector CT imaging of the lumbar spine was performed without intravenous contrast administration. Multiplanar CT image reconstructions were also generated.  COMPARISON:  None.  FINDINGS: Lumbar vertebral bodies and posterior elements are intact and aligned and maintenance of lumbar lordosis. Using the reference level of the last well-formed intervertebral disc as L5-S1, moderate L2-3 through L4-5 disc height loss, intradiscal calcification. Vacuum disc at L3-4. Probable auto body intern arthrodesis at L2-3. Mild endplate spurring Z6-1 through L4-5. Mild osteopenia without destructive bony lesions. Small probable enchondroma RIGHT iliac bone. Status post bilateral hip total  arthroplasties.  Partially imaged small pleural effusions. 3.3  cm LEFT renal cyst. Vascular calcifications versus nonobstructing RIGHT nephrolithiasis. Large amount of stool distending the sigmoid colon and rectum.  Small broad-based disc osteophyte complex at L2-3 results in mild canal stenosis. Mild to moderate RIGHT L4-5 and L5-S1 osseous neural foraminal narrowing.  IMPRESSION: No acute lumbar spine fracture, malalignment.  Mild canal stenosis at L2-3. Mild to moderate RIGHT L4-5 and L5-S1 neural foraminal narrowing.   Electronically Signed   By: Awilda Metro M.D.   On: 07/27/2015 23:00   Mr Pelvis Wo Contrast  07/30/2015   CLINICAL DATA:  Patient with baseline dementia, with worsening mental status secondary to hypernatremia, as well as metabolic secondary to infection. Acute renal failure superimposed on stage 4 chronic kidney disease. Decubitus ulcer of buttock.  EXAM: MRI PELVIS WITHOUT CONTRAST  TECHNIQUE: Multiplanar multisequence MR imaging of the pelvis was performed. No intravenous contrast was administered.  COMPARISON:  None.  FINDINGS: There are bilateral total hip arthroplasties with susceptibility artifact partially obscuring the adjacent soft tissue and osseous structures.  There is no focal marrow signal abnormality. There is no acute fracture or dislocation. The sacroiliac joints are normal. There is no sacroiliac joint effusion.  There is a sacral decubitus ulcer with mild subcutaneous edema. The underlying sacrum demonstrates no cortical destruction, periosteal reaction or marrow signal abnormality. There is chronic malalignment of the coccyx.  There is no focal fluid collection or hematoma.  There is mild generalized anasarca.  There is no pelvic free fluid. There is rectal fecal impaction with mild rectal wall thickening. There is no inguinal hernia. There is no inguinal lymphadenopathy.  There is mild edema within the left gluteus maximus muscle particularly along the medial aspect.   IMPRESSION: 1. No evidence of sacral osteomyelitis. 2. Mild edema within the left gluteus maximus muscle particularly along the medial aspect which may reflect mild myositis secondary to an infectious or inflammatory etiology. There is no drainable fluid collection or hematoma.   Electronically Signed   By: Elige Ko   On: 07/30/2015 08:01   US Renal  07/26/2015   CLINICAL DATA:  81 year old male with acute renal insufficiency.  EXAM: RENAL / URINARY TRACT ULTRASOUND COMPLETE  COMPARISON:  None.  FINDINGS: Right Kidney:  Length: 11 cm. Diffuse increased renal echogenicity compatible with underlying medical renal disease. Clinical correlation is recommended. A 2.2 x 1.5 x 1.9 cm upper pole cyst is seen. There is no hydronephrosis. Evaluation for stone or underlying solid lesion is limited due to increased echogenicity.  Left Kidney:  Length: 11 cm. There are multiple cysts in the left kidney measuring up to 4.1 x 4.4 x 3.9 cm in the upper pole. There is diffuse increased renal parenchymal echogenicity. No hydronephrosis.  Bladder:  A Foley catheter is noted within the partially distended urinary bladder.  IMPRESSION: Increased bilateral renal echogenicity likely related side and aligned medical renal disease. Clinical correlation is recommended.  No hydronephrosis.  Bilateral renal cysts.   Electronically Signed   By: Elgie Collard M.D.   On: 07/26/2015 04:27   Ir Fluoro Guide Cv Line Right  07/24/2015   CLINICAL DATA:  Urinary tract infection, needs venous access for antibiotics  EXAM: PICC PLACEMENT WITH ULTRASOUND AND FLUOROSCOPY  FLUOROSCOPY TIME:  12 seconds  TECHNIQUE: After informed consent via telephone from family was obtained, patient was placed in the supine position on angiographic table. Patency of the right brachial vein was confirmed with ultrasound with image documentation. An appropriate skin site was determined. Skin site was  marked. Region was prepped using maximum barrier technique  including cap and mask, sterile gown, sterile gloves, large sterile sheet, and Chlorhexidine as cutaneous antisepsis. The region was infiltrated locally with 1% lidocaine. Under real-time ultrasound guidance, the right brachial vein was accessed with a 21 gauge micropuncture needle; the needle tip within the vein was confirmed with ultrasound image documentation. Needle exchanged over a 018 guidewire for a peel-away sheath, through which a 5-French single-lumen power injectable PICC trimmed to 38cm was advanced, positioned with its tip near the cavoatrial junction. Spot chest radiograph confirms appropriate catheter position. Catheter was flushed per protocol and secured externally with 0 Prolene suture. The patient tolerated procedure well.  COMPLICATIONS: COMPLICATIONS none  IMPRESSION: 1. Technically successful five [place] single lumen power injectable PICC placement   Electronically Signed   By: Corlis Leak M.D.   On: 07/24/2015 15:34   Ir US Guide Vasc Access Right  07/24/2015   CLINICAL DATA:  Urinary tract infection, needs venous access for antibiotics  EXAM: PICC PLACEMENT WITH ULTRASOUND AND FLUOROSCOPY  FLUOROSCOPY TIME:  12 seconds  TECHNIQUE: After informed consent via telephone from family was obtained, patient was placed in the supine position on angiographic table. Patency of the right brachial vein was confirmed with ultrasound with image documentation. An appropriate skin site was determined. Skin site was marked. Region was prepped using maximum barrier technique including cap and mask, sterile gown, sterile gloves, large sterile sheet, and Chlorhexidine as cutaneous antisepsis. The region was infiltrated locally with 1% lidocaine. Under real-time ultrasound guidance, the right brachial vein was accessed with a 21 gauge micropuncture needle; the needle tip within the vein was confirmed with ultrasound image documentation. Needle exchanged over a 018 guidewire for a peel-away sheath, through which  a 5-French single-lumen power injectable PICC trimmed to 38cm was advanced, positioned with its tip near the cavoatrial junction. Spot chest radiograph confirms appropriate catheter position. Catheter was flushed per protocol and secured externally with 0 Prolene suture. The patient tolerated procedure well.  COMPLICATIONS: COMPLICATIONS none  IMPRESSION: 1. Technically successful five [place] single lumen power injectable PICC placement   Electronically Signed   By: Corlis Leak M.D.   On: 07/24/2015 15:34   Dg Foot 2 Views Right  07/26/2015   CLINICAL DATA:  Right lateral heel region ulcer  EXAM: RIGHT FOOT - 2 VIEW  COMPARISON:  None.  FINDINGS: Two views of the right foot submitted. No acute fracture or subluxation. Extensive osteopenia. Atherosclerotic vascular calcifications are noted. No bone destruction to suggest osteomyelitis.  IMPRESSION: Diffuse osteopenia. No acute fracture or subluxation. No definite evidence of osteomyelitis.   Electronically Signed   By: Natasha Mead M.D.   On: 07/26/2015 16:19     CBC  Recent Labs Lab 07/25/15 2227 07/26/15 0443 07/28/15 0700 07/29/15 0541 07/30/15 0630 07/31/15 0610  WBC 15.8* 16.1* 10.1 10.0 10.8* 11.8*  HGB 8.2* 8.3* 8.0* 7.3* 8.5* 9.4*  HCT 28.1* 29.0* 26.6* 24.4* 27.7* 28.9*  PLT 344 341 273 270 270 257  MCV 87.0 87.1 84.7 84.4 83.4 81.6  MCH 25.4* 24.9* 25.5* 25.3* 25.6* 26.6  MCHC 29.2* 28.6* 30.1 29.9* 30.7 32.5  RDW 17.5* 17.5* 17.3* 17.2* 16.9* 16.7*  LYMPHSABS 1.8  --   --   --   --  1.6  MONOABS 1.3*  --   --   --   --  1.2*  EOSABS 0.6  --   --   --   --  0.3  BASOSABS  0.1  --   --   --   --  0.1    Chemistries   Recent Labs Lab 07/26/15 0443  07/27/15 0644  07/28/15 0700 07/28/15 1424 07/29/15 0541 07/30/15 0630 07/31/15 0610  NA 161*  < > 154*  < > 150* 147* 145 142 143  K 3.6  < > 3.4*  --  3.0*  --  3.3* 3.3* 3.4*  CL >130*  < > 124*  --  120*  --  115* 114* 114*  CO2 24  < > 23  --  23  --  24 22 22   GLUCOSE  144*  < > 138*  --  124*  --  119* 81 75  BUN 55*  < > 47*  --  48*  --  44* 42* 36*  CREATININE 3.41*  < > 3.10*  --  2.87*  --  2.80* 2.71* 2.66*  CALCIUM 9.6  < > 9.8  --  9.8  --  9.3 9.2 9.3  MG  --   --   --   --   --   --  1.9  --   --   AST 20  --   --   --   --   --   --   --   --   ALT 11*  --   --   --   --   --   --   --   --   ALKPHOS 71  --   --   --   --   --   --   --   --   BILITOT 0.2*  --   --   --   --   --   --   --   --   < > = values in this interval not displayed. ------------------------------------------------------------------------------------------------------------------ estimated creatinine clearance is 17.6 mL/min (by C-G formula based on Cr of 2.66). ------------------------------------------------------------------------------------------------------------------ No results for input(s): HGBA1C in the last 72 hours. ------------------------------------------------------------------------------------------------------------------ No results for input(s): CHOL, HDL, LDLCALC, TRIG, CHOLHDL, LDLDIRECT in the last 72 hours. ------------------------------------------------------------------------------------------------------------------ No results for input(s): TSH, T4TOTAL, T3FREE, THYROIDAB in the last 72 hours.  Invalid input(s): FREET3 ------------------------------------------------------------------------------------------------------------------ No results for input(s): VITAMINB12, FOLATE, FERRITIN, TIBC, IRON, RETICCTPCT in the last 72 hours.  Coagulation profile  Recent Labs Lab 07/26/15 0206  INR 1.33    No results for input(s): DDIMER in the last 72 hours.  Cardiac Enzymes No results for input(s): CKMB, TROPONINI, MYOGLOBIN in the last 168 hours.  Invalid input(s): CK ------------------------------------------------------------------------------------------------------------------ Invalid input(s): POCBNP     Time Spent in minutes   35  minutes   Xu,Fang MD PhD on 07/31/2015 at 12:47 PM  Between 7am to 7pm - Pager - [phone]  After 7pm go to [email or web address] - password [facility]  [facility]   Office  [phone]

## 2015-07-31 NOTE — Progress Notes (Signed)
Patient ID: Joseph Ponce, male   DOB: 1923/12/06, 81 y.o.   MRN: [id number]     [facility]      [address] [place], [place] [address]    Phone: [phone] FAX: [phone]     Subjective: Afebrile.  Pt pleasant.   Objective:  Vital signs:  Filed Vitals:   07/30/15 0430 07/30/15 0738 07/30/15 2107 07/31/15 0906  BP: 155/69 157/67 149/55 179/83  Pulse: 56 55 57 65  Temp:  97.5 F (36.4 C) 98.2 F (36.8 C) 98.5 F (36.9 C)  TempSrc:  Oral Oral Oral  Resp: _0 Height:      Weight:   76.295 kg (168 lb 3.2 oz)   SpO2: 100% 98% 99% 99%    Last BM Date: 07/30/15  Intake/Output   Yesterday:  09/21 0701 - 09/22 0700 In: 320 [P.O.:320] Out: 1950 [Urine:1950] This shift:  Total I/O In: 330 [P.O.:330] Out: 350 [Urine:350]   Physical Exam: General: Pt awake/alert/NAD Skin: sacral decub, eschar, no purulent drainage or malodor.     Problem List:   Principal Problem:   Acute renal failure superimposed on stage 4 chronic kidney disease Active Problems:   Dementia without behavioral disturbance   Anemia   Renal artery stenosis   S/P BKA (below knee amputation) unilateral   Hypertension   Glaucoma (increased eye pressure)   COPD (chronic obstructive pulmonary disease)   Hyperlipemia   Osteoporosis   Chronic pain syndrome   Bradycardia, sinus   Depression   Hypernatremia   Dehydration   Infection of right foot   Decubitus ulcer of buttock, stage 3   Protein-calorie malnutrition   Acute renal failure syndrome   Palliative care encounter   DNR (do not resuscitate) discussion   Sacral ulcer   FTT (failure to thrive) in adult    Results:   Labs: Results for orders placed or performed during the hospital encounter of 07/25/15 (from the past 48 hour(s))  Prepare RBC     Status: None   Collection Time: 07/29/15  4:21 PM  Result Value Ref Range   Order Confirmation ORDER PROCESSED BY BLOOD BANK    Type and screen     Status: None   Collection Time: 07/29/15  5:45 PM  Result Value Ref Range   ABO/RH(D) O POS    Antibody Screen NEG    Sample Expiration 08/01/2015    Unit Number [id number]    Blood Component Type RBC LR PHER2    Unit division 00    Status of Unit ISSUED,FINAL    Transfusion Status OK TO TRANSFUSE    Crossmatch Result Compatible   CBC     Status: Abnormal   Collection Time: 07/30/15  6:30 AM  Result Value Ref Range   WBC 10.8 (H) 4.0 - 10.5 K/uL   RBC 3.32 (L) 4.22 - 5.81 MIL/uL   Hemoglobin 8.5 (L) 13.0 - 17.0 g/dL   HCT 27.7 (L) 39.0 - 52.0 %   MCV 83.4 78.0 - 100.0 fL   MCH 25.6 (L) 26.0 - 34.0 pg   MCHC 30.7 30.0 - 36.0 g/dL   RDW 16.9 (H) 11.5 - 15.5 %   Platelets 270 150 - 400 K/uL  Basic metabolic panel     Status: Abnormal   Collection Time: 07/30/15  6:30 AM  Result Value Ref Range   Sodium 142 135 - 145 mmol/L   Potassium 3.3 (L) 3.5 - 5.1 mmol/L  Chloride 114 (H) 101 - 111 mmol/L   CO2 22 22 - 32 mmol/L   Glucose, Bld 81 65 - 99 mg/dL   BUN 42 (H) 6 - 20 mg/dL   Creatinine, Ser 2.71 (H) 0.61 - 1.24 mg/dL   Calcium 9.2 8.9 - 10.3 mg/dL   GFR calc non Af Amer 19 (L) >60 mL/min   GFR calc Af Amer 22 (L) >60 mL/min    Comment: (NOTE) The eGFR has been calculated using the CKD EPI equation. This calculation has not been validated in all clinical situations. eGFR's persistently <60 mL/min signify possible Chronic Kidney Disease.    Anion gap 6 5 - 15  Glucose, capillary     Status: None   Collection Time: 07/30/15  7:36 AM  Result Value Ref Range   Glucose-Capillary 98 65 - 99 mg/dL  CBC with Differential/Platelet     Status: Abnormal   Collection Time: 07/31/15  6:10 AM  Result Value Ref Range   WBC 11.8 (H) 4.0 - 10.5 K/uL   RBC 3.54 (L) 4.22 - 5.81 MIL/uL   Hemoglobin 9.4 (L) 13.0 - 17.0 g/dL   HCT 28.9 (L) 39.0 - 52.0 %   MCV 81.6 78.0 - 100.0 fL   MCH 26.6 26.0 - 34.0 pg   MCHC 32.5 30.0 - 36.0 g/dL   RDW 16.7 (H) 11.5 -  15.5 %   Platelets 257 150 - 400 K/uL   Neutrophils Relative % 74 %   Neutro Abs 8.7 (H) 1.7 - 7.7 K/uL   Lymphocytes Relative 13 %   Lymphs Abs 1.6 0.7 - 4.0 K/uL   Monocytes Relative 10 %   Monocytes Absolute 1.2 (H) 0.1 - 1.0 K/uL   Eosinophils Relative 2 %   Eosinophils Absolute 0.3 0.0 - 0.7 K/uL   Basophils Relative 1 %   Basophils Absolute 0.1 0.0 - 0.1 K/uL  Basic metabolic panel     Status: Abnormal   Collection Time: 07/31/15  6:10 AM  Result Value Ref Range   Sodium 143 135 - 145 mmol/L   Potassium 3.4 (L) 3.5 - 5.1 mmol/L   Chloride 114 (H) 101 - 111 mmol/L   CO2 22 22 - 32 mmol/L   Glucose, Bld 75 65 - 99 mg/dL   BUN 36 (H) 6 - 20 mg/dL   Creatinine, Ser 2.66 (H) 0.61 - 1.24 mg/dL   Calcium 9.3 8.9 - 10.3 mg/dL   GFR calc non Af Amer 20 (L) >60 mL/min   GFR calc Af Amer 23 (L) >60 mL/min    Comment: (NOTE) The eGFR has been calculated using the CKD EPI equation. This calculation has not been validated in all clinical situations. eGFR's persistently <60 mL/min signify possible Chronic Kidney Disease.    Anion gap 7 5 - 15    Imaging / Studies: No results found.  Medications / Allergies:  Scheduled Meds: . sodium chloride   Intravenous Once  . antiseptic oral rinse  7 mL Mouth Rinse BID  . aspirin  81 mg Oral Daily  . brimonidine  1 drop Both Eyes BID  . calcium-vitamin D  1 tablet Oral BID WC  . collagenase   Topical Daily  . desmopressin  1 spray Nasal TID  . dorzolamide  1 drop Both Eyes BID  . feeding supplement (NEPRO CARB STEADY)  237 mL Oral BID BM  . feeding supplement (PRO-STAT SUGAR FREE 64)  30 mL Oral BID  . gabapentin  100 mg Oral BID  .  heparin  5,000 Units Subcutaneous 3 times per day  . hydrALAZINE  50 mg Oral 3 times per day  . hydroxypropyl methylcellulose / hypromellose  1 drop Both Eyes TID  . latanoprost  1 drop Both Eyes QHS  . memantine  28 mg Oral Daily  . mirtazapine  15 mg Oral QHS  . multivitamin  5 mL Oral Daily  .  nitroGLYCERIN  0.2 mg Transdermal Daily  . OLANZapine  5 mg Oral Daily  . piperacillin-tazobactam (ZOSYN)  IV  2.25 g Intravenous 3 times per day  . saccharomyces boulardii  250 mg Oral BID  . senna  1 tablet Oral QHS  . sodium chloride  3 mL Intravenous Q12H  . vancomycin  1,000 mg Intravenous Q48H  . vitamin C  500 mg Oral BID  . zinc sulfate  220 mg Oral Daily   Continuous Infusions:  PRN Meds:.acetaminophen, albuterol, hydrALAZINE, HYDROcodone-acetaminophen, ondansetron **OR** ondansetron (ZOFRAN) IV, polyethylene glycol, RESOURCE THICKENUP CLEAR, sodium chloride  Antibiotics: Anti-infectives    Start     Dose/Rate Route Frequency Ordered Stop   07/27/15 0900  vancomycin (VANCOCIN) IVPB 1000 mg/200 mL premix     1,000 mg 200 mL/hr over 60 Minutes Intravenous Every 48 hours 07/27/15 0830     07/26/15 0300  piperacillin-tazobactam (ZOSYN) IVPB 2.25 g     2.25 g 100 mL/hr over 30 Minutes Intravenous 3 times per day 07/26/15 0257     07/26/15 0200  piperacillin-tazobactam (ZOSYN) injection 2.25 g  Status:  Discontinued     2.25 g Intramuscular Every 8 hours 07/26/15 0052 07/26/15 0257   07/26/15 0200  linezolid (ZYVOX) tablet 600 mg  Status:  Discontinued     600 mg Oral 2 times daily 07/26/15 0052 07/27/15 0808        Assessment/Plan Sacral decubitus ulcer-agree with WOC.  Wound not benefit from hydrotherapy or debridement.  Does not appear infected.  Recommend dressing changes and WOC follow up. Does not need antibiotics from a surgical standpoint. Surgery signing off.  Please call with questions or concerns.   Erby Pian, [facility] Pager [phone]) For consults and floor pages call [phone])  07/31/2015 3:46 PM

## 2015-07-31 NOTE — Consult Note (Signed)
WOC follow up, sacral wound evaluated by [facility] and they do not plan to perform any further bedside debridement. Just as I suspected patient is not a surgical candidate.  Hydrotherapy would not benefit this patient as the eschar is fairly adherent even with 5 days of enzymatic debridement ointment.  Heel ulcer is stable, best practice would be to leave this stable and offload unless patient demonstrates any s/s of infection at the site.  WOC team will follow along with you for weekly wound assessments.  Please notify me of any acute changes in the wounds or any new areas of concerns Armen Pickup RN,CWOCN [phone]

## 2015-08-01 DIAGNOSIS — Z515 Encounter for palliative care: Secondary | ICD-10-CM

## 2015-08-01 DIAGNOSIS — Z789 Other specified health status: Secondary | ICD-10-CM

## 2015-08-01 DIAGNOSIS — G894 Chronic pain syndrome: Secondary | ICD-10-CM

## 2015-08-01 DIAGNOSIS — E870 Hyperosmolality and hypernatremia: Secondary | ICD-10-CM

## 2015-08-01 DIAGNOSIS — Z7189 Other specified counseling: Secondary | ICD-10-CM

## 2015-08-01 LAB — CBC
HCT: 28.5 % — ABNORMAL LOW (ref 39.0–52.0)
Hemoglobin: 9.1 g/dL — ABNORMAL LOW (ref 13.0–17.0)
MCH: 26.5 pg (ref 26.0–34.0)
MCHC: 31.9 g/dL (ref 30.0–36.0)
MCV: 82.8 fL (ref 78.0–100.0)
Platelets: 289 10*3/uL (ref 150–400)
RBC: 3.44 MIL/uL — ABNORMAL LOW (ref 4.22–5.81)
RDW: 17.1 % — AB (ref 11.5–15.5)
WBC: 12.8 10*3/uL — ABNORMAL HIGH (ref 4.0–10.5)

## 2015-08-01 LAB — GLUCOSE, CAPILLARY
GLUCOSE-CAPILLARY: 100 mg/dL — AB (ref 65–99)
Glucose-Capillary: 100 mg/dL — ABNORMAL HIGH (ref 65–99)

## 2015-08-01 LAB — BASIC METABOLIC PANEL
Anion gap: 7 (ref 5–15)
BUN: 33 mg/dL — ABNORMAL HIGH (ref 6–20)
CALCIUM: 10 mg/dL (ref 8.9–10.3)
CO2: 25 mmol/L (ref 22–32)
CREATININE: 2.36 mg/dL — AB (ref 0.61–1.24)
Chloride: 115 mmol/L — ABNORMAL HIGH (ref 101–111)
GFR calc non Af Amer: 23 mL/min — ABNORMAL LOW (ref >60)
GFR, EST AFRICAN AMERICAN: 26 mL/min — AB (ref >60)
Glucose, Bld: 114 mg/dL — ABNORMAL HIGH (ref 65–99)
Potassium: 4.3 mmol/L (ref 3.5–5.1)
SODIUM: 147 mmol/L — AB (ref 135–145)

## 2015-08-01 MED ORDER — MORPHINE SULFATE (CONCENTRATE) 10 MG /0.5 ML PO SOLN: 5.0000 mg | ORAL | Status: AC | PRN

## 2015-08-01 MED ORDER — LORAZEPAM 2 MG/ML PO CONC: 0.5000 mg | Freq: Three times a day (TID) | ORAL | Status: AC | PRN

## 2015-08-01 NOTE — Progress Notes (Signed)
Pt prepared for d/c to SNF. IV d/c'd. Skin intact except as charted in most recent assessments. Vitals are stable. Report called to Elease Hashimoto at [facility] (eceiving facility) Pt to be transported by ambulance service.  Peri Maris, MBA, BS, RN

## 2015-08-01 NOTE — Progress Notes (Signed)
Plan is for patient to return to [facility] with full Hospice Care. Goals are comfort, no rehospitalization and preservation of QOL. Please call Palliative team for questions or changes in his symptom managment needs.  Anderson Malta, DO Palliative Medicine [phone]/ [phone]/ On Amion

## 2015-08-01 NOTE — Care Management Important Message (Signed)
Important Message  Patient Details  Name: Joseph Ponce MRN: [id number] Date of Birth: 1924/10/10   Medicare Important Message Given:  Yes-third notification given    RoyalAnnamarie Major, RN 08/01/2015, 12:59 PM

## 2015-08-01 NOTE — Discharge Summary (Signed)
Discharge Summary  Joseph Ponce RUE:[id number] DOB: 08-Dec-1923  PCP: [name], MD  Admit date: 07/25/2015 Discharge date: 08/01/2015  Time spent: >67mins  Recommendations for Outpatient Follow-up:  1. Patient is discharged to SNF with hospice  Discharge Diagnoses:  Active Hospital Problems   Diagnosis Date Noted  . Acute renal failure superimposed on stage 4 chronic kidney disease 07/26/2015  . Acute renal failure syndrome   . Palliative care encounter   . DNR (do not resuscitate) discussion   . Sacral ulcer   . FTT (failure to thrive) in adult   . Hypernatremia 07/26/2015  . Dehydration 07/26/2015  . Infection of right foot 07/26/2015  . Decubitus ulcer of buttock, stage 3 07/26/2015  . Protein-calorie malnutrition 07/26/2015  . Depression 02/24/2015  . Bradycardia, sinus 01/29/2014  . Osteoporosis 01/26/2013  . Chronic pain syndrome 01/26/2013  . Dementia without behavioral disturbance 06/25/2012  . Anemia 06/25/2012  . Renal artery stenosis   . Hypertension   . Hyperlipemia   . Glaucoma (increased eye pressure)   . COPD (chronic obstructive pulmonary disease)   . S/P BKA (below knee amputation) unilateral 01/21/2006    Resolved Hospital Problems   Diagnosis Date Noted Date Resolved  No resolved problems to display.    Discharge Condition: stable  Diet recommendation: dysphagia 1 diet and nectar thick liquid, pleasure feeds.  Filed Weights   07/26/15 2119 07/30/15 2107 07/31/15 2031  Weight: 160 lb (72.576 kg) 168 lb 3.2 oz (76.295 kg) 169 lb 1.6 oz (76.703 kg)    History of present illness:  Joseph Ponce is a 81 y.o. male with PMH of dementia, hypertension, hyperlipidemia, depression, osteoporosis, stroke, dysphasia, PVD, COPD, renal artery stenosis,CKD-IV, who presents abnormal lab.  Patient has dementia and is unable to provide accurate medical history, therefore, most of the history is obtained by discussing the case with ED and per EMS report.  It seems that patient was sent from [facility] for elevated creatinine and BUN. He does not have complaints. Of note, patient was started with IV linezolid and zosyn for right foot heal infection and decubitus infection via PICC line on 07/24/15 (supposed to take for two weeks). Patient has pain over foot on palpation.   In ED, patient was found to have creatinine increased from 1.77 on 05/04/14 to 3.50, BUN 60, hyponatremia with a sodium of 159, WBC 15.8, bradycardia, temperature normal. Patient is admitted to inpatient for further reevaluation and treatment.  Hospital Course:  Principal Problem:   Acute renal failure superimposed on stage 4 chronic kidney disease Active Problems:   Dementia without behavioral disturbance   Anemia   Renal artery stenosis   S/P BKA (below knee amputation) unilateral   Hypertension   Glaucoma (increased eye pressure)   COPD (chronic obstructive pulmonary disease)   Hyperlipemia   Osteoporosis   Chronic pain syndrome   Bradycardia, sinus   Depression   Hypernatremia   Dehydration   Infection of right foot   Decubitus ulcer of buttock, stage 3   Protein-calorie malnutrition   Acute renal failure syndrome   Palliative care encounter   DNR (do not resuscitate) discussion   Sacral ulcer   FTT (failure to thrive) in adult  Acute on chronic renal failure - likely multifactorial including dehydration, abx side effect, skin infection - received IV fluid, patient had indwelling Foley catheter recently inserted at the nursing home. - baseline creatinine 1.7-2, peaked at 3.4, continue to improved, good urine output,  Cr 2.36 at discharge -  patient is discharged with foley for comfort.  Hypernatremia, with h/o neurogenic diabetes insipitus (per SNF note, has been on DDVAP chronically for years) - DDAVP continued and patient also recieved D5W at 125 mL/h, later with D5 half-normal saline at 75 mL per hour , this was stopped on 9/19, na wnl since  9/20  Acute encephalopathy - Patient with baseline dementia, with worsening mental status secondary to hypernatremia, as well as metabolic secondary to infection -still refusing meds, but seems more alert and conversant.   Right foot and sacral decubitus ulcer infection ,  - per SNF notes patient has been bed bound for a few months, then developed ulcer, was treated conservatively with topical enzymatic debridement and oral abx, however, with worsening symptom, patient was started on IV linezolid and Zosyn at SNF mid september - Follow on blood cultures : No growth to date - - IV linezolid discontinued since admission, he is started on IV vancomycin from 9/18and zosyn from 9/17 - Right foot x-ray with no evidence of osteomyelitis, MRI of sacral spine no osteomyelitis, does has left gluteus muscle edema, surgery consulted, not a candidate for surgery, patient does not cooperate with bedside debridement, conservative management for now. Appreciate wound care input. -family wish patient to be comfort measures, hospice, no further surgery desired.  Dementia without behavioral disturbance: -continue Memantine -refuse take meds, now on hospice, comfort measures  Anemia: - hgb dropped from 11.8 (this is after PRBC transfusion) on 04/12/14 to 8.2 on admission, dropped today to 7.3, and this is most likely dilutional effect as an IV fluids, no evidence of GI bleed, FOBT negative, s/p 1 unit PRBC initial on admission - FOBT negative - Iron 28, ferritin 608, folate and B-12 within normal limit, workup significant for anemia of chronic illness, and renal insufficiency .  Hypertension: -Bp elevated due to refusing oral meds, received iv hydralazine,  -Now hospice, comfort measures  Bradycardia - Symptomatic, continue telemetry monitor, no evidence of AV block on EKG.  Depression: -Continue home medications: mirtazapine -now hospice comfort measures  COPD (chronic obstructive pulmonary  disease) - albuterol nbx prn  Hx of CVA: -ASA - Continue to hold Plavix,given his anemia, now refusing oral meds  protien-calorie malnutrition-moderate: -Continue nutrition supplement -now comfort measures and pleasure feeds  Hypokalemia - Repleted, magnesium wnl  FTT: appreciate palliative care input, patient now on comfort measures and hospice care. No rehospitalization.  Code Status: DO NOT RESUSCITATE, discussed with POA [name] 9/18,   Family Communication: spoke with [facility] 9/18, palliative care service has multiple discussions with POA.  Disposition Plan:  SNF with hospice, no rehospitalization.   Procedures  None    Consults  General surgery Palliative care   Discharge Exam: BP 169/70 mmHg  Pulse 92  Temp(Src) 98.2 F (36.8 C) (Oral)  Resp 18  Ht 5\' 7"  (1.702 m)  Wt 169 lb 1.6 oz (76.703 kg)  BMI 26.48 kg/m2  SpO2 100%  more awake and demented, conversant and calm this am, still does not want to take meds Supple Neck,No JVD,  Symmetrical Chest wall movement, Good air movement bilaterally,  RRR,No Gallops,Rubs or new Murmurs,  +ve B.Sounds, Abd Soft, No tenderness, No organomegaly appriciated, left BKA, unstageable right heel pressure ulcer, unstageable decubitus sacral ulcer with minimal discharge   Discharge Instructions You were cared for by a hospitalist during your hospital stay. If you have any questions about your discharge medications or the care you received while you were in the hospital after you  are discharged, you can call the unit and asked to speak with the hospitalist on call if the hospitalist that took care of you is not available. Once you are discharged, your primary care physician will handle any further medical issues. Please note that NO REFILLS for any discharge medications will be authorized once you are discharged, as it is imperative that you return to your primary care physician (or establish a relationship  with a primary care physician if you do not have one) for your aftercare needs so that they can reassess your need for medications and monitor your lab values.  Discharge Instructions    Diet - low sodium heart healthy    Complete by:  As directed      Increase activity slowly    Complete by:  As directed             Medication List    STOP taking these medications        acetaminophen 500 MG tablet  Commonly known as:  TYLENOL     amLODipine 10 MG tablet  Commonly known as:  NORVASC     aspirin 81 MG chewable tablet     brimonidine 0.1 % Soln  Commonly known as:  ALPHAGAN P     calcium-vitamin D 500-200 MG-UNIT per tablet  Commonly known as:  OSCAL WITH D     CERTA-VITE PO     denosumab 60 MG/ML Soln injection  Commonly known as:  PROLIA     desmopressin 0.01 % nasal solution  Commonly known as:  DDAVP     dorzolamide 2 % ophthalmic solution  Commonly known as:  TRUSOPT     feeding supplement (PRO-STAT SUGAR FREE 64) Liqd     feeding supplement Liqd     gabapentin 100 MG capsule  Commonly known as:  NEURONTIN     hydrALAZINE 50 MG tablet  Commonly known as:  APRESOLINE     HYDROcodone-acetaminophen 5-325 MG per tablet  Commonly known as:  NORCO/VICODIN     hydroxypropyl methylcellulose / hypromellose 2.5 % ophthalmic solution  Commonly known as:  ISOPTO TEARS / GONIOVISC     linezolid 600 MG tablet  Commonly known as:  ZYVOX     magnesium hydroxide 400 MG/5ML suspension  Commonly known as:  MILK OF MAGNESIA     memantine 28 MG Cp24 24 hr capsule  Commonly known as:  NAMENDA XR     metoCLOPramide 5 MG tablet  Commonly known as:  REGLAN     mirtazapine 15 MG tablet  Commonly known as:  REMERON     nitroGLYCERIN 0.2 mg/hr patch  Commonly known as:  NITRODUR - Dosed in mg/24 hr     piperacillin-tazobactam 2.25 (2-0.25) G injection  Commonly known as:  ZOSYN     polyethylene glycol packet  Commonly known as:  MIRALAX / GLYCOLAX      saccharomyces boulardii 250 MG capsule  Commonly known as:  FLORASTOR     senna 8.6 MG Tabs tablet  Commonly known as:  SENOKOT     Travoprost (BAK Free) 0.004 % Soln ophthalmic solution  Commonly known as:  TRAVATAN     vitamin C 500 MG tablet  Commonly known as:  ASCORBIC ACID     zinc sulfate 220 MG capsule      TAKE these medications        LORazepam 2 MG/ML concentrated solution  Commonly known as:  ATIVAN  Take 0.3 mLs (0.6 mg total) by  mouth every 8 (eight) hours as needed for anxiety.     morphine CONCENTRATE 10 mg / 0.5 ml concentrated solution  Place 0.25 mLs (5 mg total) under the tongue every 2 (two) hours as needed for severe pain, anxiety or shortness of breath.       No Known Allergies    The results of significant diagnostics from this hospitalization (including imaging, microbiology, ancillary and laboratory) are listed below for reference.    Significant Diagnostic Studies: Ct Lumbar Spine Wo Contrast  07/27/2015   CLINICAL DATA:  Extreme back pain, in addition to pressure ulcer. History of dementia, chronic kidney disease, below-knee amputation.  EXAM: CT LUMBAR SPINE WITHOUT CONTRAST  TECHNIQUE: Multidetector CT imaging of the lumbar spine was performed without intravenous contrast administration. Multiplanar CT image reconstructions were also generated.  COMPARISON:  None.  FINDINGS: Lumbar vertebral bodies and posterior elements are intact and aligned and maintenance of lumbar lordosis. Using the reference level of the last well-formed intervertebral disc as L5-S1, moderate L2-3 through L4-5 disc height loss, intradiscal calcification. Vacuum disc at L3-4. Probable auto body intern arthrodesis at L2-3. Mild endplate spurring Z6-1 through L4-5. Mild osteopenia without destructive bony lesions. Small probable enchondroma RIGHT iliac bone. Status post bilateral hip total arthroplasties.  Partially imaged small pleural effusions. 3.3 cm LEFT renal cyst. Vascular  calcifications versus nonobstructing RIGHT nephrolithiasis. Large amount of stool distending the sigmoid colon and rectum.  Small broad-based disc osteophyte complex at L2-3 results in mild canal stenosis. Mild to moderate RIGHT L4-5 and L5-S1 osseous neural foraminal narrowing.  IMPRESSION: No acute lumbar spine fracture, malalignment.  Mild canal stenosis at L2-3. Mild to moderate RIGHT L4-5 and L5-S1 neural foraminal narrowing.   Electronically Signed   By: Awilda Metro M.D.   On: 07/27/2015 23:00   Mr Pelvis Wo Contrast  07/30/2015   CLINICAL DATA:  Patient with baseline dementia, with worsening mental status secondary to hypernatremia, as well as metabolic secondary to infection. Acute renal failure superimposed on stage 4 chronic kidney disease. Decubitus ulcer of buttock.  EXAM: MRI PELVIS WITHOUT CONTRAST  TECHNIQUE: Multiplanar multisequence MR imaging of the pelvis was performed. No intravenous contrast was administered.  COMPARISON:  None.  FINDINGS: There are bilateral total hip arthroplasties with susceptibility artifact partially obscuring the adjacent soft tissue and osseous structures.  There is no focal marrow signal abnormality. There is no acute fracture or dislocation. The sacroiliac joints are normal. There is no sacroiliac joint effusion.  There is a sacral decubitus ulcer with mild subcutaneous edema. The underlying sacrum demonstrates no cortical destruction, periosteal reaction or marrow signal abnormality. There is chronic malalignment of the coccyx.  There is no focal fluid collection or hematoma.  There is mild generalized anasarca.  There is no pelvic free fluid. There is rectal fecal impaction with mild rectal wall thickening. There is no inguinal hernia. There is no inguinal lymphadenopathy.  There is mild edema within the left gluteus maximus muscle particularly along the medial aspect.  IMPRESSION: 1. No evidence of sacral osteomyelitis. 2. Mild edema within the left gluteus  maximus muscle particularly along the medial aspect which may reflect mild myositis secondary to an infectious or inflammatory etiology. There is no drainable fluid collection or hematoma.   Electronically Signed   By: Elige Ko   On: 07/30/2015 08:01   US Renal  07/26/2015   CLINICAL DATA:  81 year old male with acute renal insufficiency.  EXAM: RENAL / URINARY TRACT ULTRASOUND COMPLETE  COMPARISON:  None.  FINDINGS: Right Kidney:  Length: 11 cm. Diffuse increased renal echogenicity compatible with underlying medical renal disease. Clinical correlation is recommended. A 2.2 x 1.5 x 1.9 cm upper pole cyst is seen. There is no hydronephrosis. Evaluation for stone or underlying solid lesion is limited due to increased echogenicity.  Left Kidney:  Length: 11 cm. There are multiple cysts in the left kidney measuring up to 4.1 x 4.4 x 3.9 cm in the upper pole. There is diffuse increased renal parenchymal echogenicity. No hydronephrosis.  Bladder:  A Foley catheter is noted within the partially distended urinary bladder.  IMPRESSION: Increased bilateral renal echogenicity likely related side and aligned medical renal disease. Clinical correlation is recommended.  No hydronephrosis.  Bilateral renal cysts.   Electronically Signed   By: Elgie Collard M.D.   On: 07/26/2015 04:27   Ir Fluoro Guide Cv Line Right  07/24/2015   CLINICAL DATA:  Urinary tract infection, needs venous access for antibiotics  EXAM: PICC PLACEMENT WITH ULTRASOUND AND FLUOROSCOPY  FLUOROSCOPY TIME:  12 seconds  TECHNIQUE: After informed consent via telephone from family was obtained, patient was placed in the supine position on angiographic table. Patency of the right brachial vein was confirmed with ultrasound with image documentation. An appropriate skin site was determined. Skin site was marked. Region was prepped using maximum barrier technique including cap and mask, sterile gown, sterile gloves, large sterile sheet, and Chlorhexidine  as cutaneous antisepsis. The region was infiltrated locally with 1% lidocaine. Under real-time ultrasound guidance, the right brachial vein was accessed with a 21 gauge micropuncture needle; the needle tip within the vein was confirmed with ultrasound image documentation. Needle exchanged over a 018 guidewire for a peel-away sheath, through which a 5-French single-lumen power injectable PICC trimmed to 38cm was advanced, positioned with its tip near the cavoatrial junction. Spot chest radiograph confirms appropriate catheter position. Catheter was flushed per protocol and secured externally with 0 Prolene suture. The patient tolerated procedure well.  COMPLICATIONS: COMPLICATIONS none  IMPRESSION: 1. Technically successful five [place] single lumen power injectable PICC placement   Electronically Signed   By: Corlis Leak M.D.   On: 07/24/2015 15:34   Ir US Guide Vasc Access Right  07/24/2015   CLINICAL DATA:  Urinary tract infection, needs venous access for antibiotics  EXAM: PICC PLACEMENT WITH ULTRASOUND AND FLUOROSCOPY  FLUOROSCOPY TIME:  12 seconds  TECHNIQUE: After informed consent via telephone from family was obtained, patient was placed in the supine position on angiographic table. Patency of the right brachial vein was confirmed with ultrasound with image documentation. An appropriate skin site was determined. Skin site was marked. Region was prepped using maximum barrier technique including cap and mask, sterile gown, sterile gloves, large sterile sheet, and Chlorhexidine as cutaneous antisepsis. The region was infiltrated locally with 1% lidocaine. Under real-time ultrasound guidance, the right brachial vein was accessed with a 21 gauge micropuncture needle; the needle tip within the vein was confirmed with ultrasound image documentation. Needle exchanged over a 018 guidewire for a peel-away sheath, through which a 5-French single-lumen power injectable PICC trimmed to 38cm was advanced, positioned with  its tip near the cavoatrial junction. Spot chest radiograph confirms appropriate catheter position. Catheter was flushed per protocol and secured externally with 0 Prolene suture. The patient tolerated procedure well.  COMPLICATIONS: COMPLICATIONS none  IMPRESSION: 1. Technically successful five [place] single lumen power injectable PICC placement   Electronically Signed   By: Corlis Leak M.D.   On:  07/24/2015 15:34   Dg Foot 2 Views Right  07/26/2015   CLINICAL DATA:  Right lateral heel region ulcer  EXAM: RIGHT FOOT - 2 VIEW  COMPARISON:  None.  FINDINGS: Two views of the right foot submitted. No acute fracture or subluxation. Extensive osteopenia. Atherosclerotic vascular calcifications are noted. No bone destruction to suggest osteomyelitis.  IMPRESSION: Diffuse osteopenia. No acute fracture or subluxation. No definite evidence of osteomyelitis.   Electronically Signed   By: Natasha Mead M.D.   On: 07/26/2015 16:19    Microbiology: Recent Results (from the past 240 hour(s))  Culture, blood (x 2)     Status: None   Collection Time: 07/26/15  1:59 AM  Result Value Ref Range Status   Specimen Description BLOOD RIGHT HAND  Final   Special Requests IN PEDIATRIC BOTTLE 3CC  Final   Culture NO GROWTH 5 DAYS  Final   Report Status 07/31/2015 FINAL  Final  MRSA PCR Screening     Status: Abnormal   Collection Time: 07/26/15  2:02 AM  Result Value Ref Range Status   MRSA by PCR POSITIVE (A) NEGATIVE Final    Comment:        The GeneXpert MRSA Assay (FDA approved for NASAL specimens only), is one component of a comprehensive MRSA colonization surveillance program. It is not intended to diagnose MRSA infection nor to guide or monitor treatment for MRSA infections. RESULT CALLED TO, READ BACK BY AND VERIFIED WITH: ZEE MAHMOUD@[id number]  07/26/15 MKELLY   Culture, blood (x 2)     Status: None   Collection Time: 07/26/15  2:06 AM  Result Value Ref Range Status   Specimen Description BLOOD LEFT HAND   Final   Special Requests IN PEDIATRIC BOTTLE 3CC  Final   Culture NO GROWTH 5 DAYS  Final   Report Status 07/31/2015 FINAL  Final     Labs: Basic Metabolic Panel:  Recent Labs Lab 07/28/15 0700 07/28/15 1424 07/29/15 0541 07/30/15 0630 07/31/15 0610 08/01/15 0550  NA 150* 147* 145 142 143 147*  K 3.0*  --  3.3* 3.3* 3.4* 4.3  CL 120*  --  115* 114* 114* 115*  CO2 23  --  GLUCOSE 124*  --  119* 81 75 114*  BUN 48*  --  44* 42* 36* 33*  CREATININE 2.87*  --  2.80* 2.71* 2.66* 2.36*  CALCIUM 9.8  --  9.3 9.2 9.3 10.0  MG  --   --  1.9  --   --   --   PHOS  --   --  3.7  --   --   --    Liver Function Tests:  Recent Labs Lab 07/26/15 0443  AST 20  ALT 11*  ALKPHOS 71  BILITOT 0.2*  PROT 6.1*  ALBUMIN 1.7*   No results for input(s): LIPASE, AMYLASE in the last 168 hours. No results for input(s): AMMONIA in the last 168 hours. CBC:  Recent Labs Lab 07/25/15 2227  07/28/15 0700 07/29/15 0541 07/30/15 0630 07/31/15 0610 08/01/15 0550  WBC 15.8*  < > 10.1 10.0 10.8* 11.8* 12.8*  NEUTROABS 11.9*  --   --   --   --  8.7*  --   HGB 8.2*  < > 8.0* 7.3* 8.5* 9.4* 9.1*  HCT 28.1*  < > 26.6* 24.4* 27.7* 28.9* 28.5*  MCV 87.0  < > 84.7 84.4 83.4 81.6 82.8  PLT 344  < > 273 270 270 257 289  < > =  values in this interval not displayed. Cardiac Enzymes: No results for input(s): CKTOTAL, CKMB, CKMBINDEX, TROPONINI in the last 168 hours. BNP: BNP (last 3 results) No results for input(s): BNP in the last 8760 hours.  ProBNP (last 3 results) No results for input(s): PROBNP in the last 8760 hours.  CBG:  Recent Labs Lab 07/27/15 0753 07/27/15 2107 07/29/15 0821 07/30/15 0736 08/01/15 0747  GLUCAP 133* 155* 105* 98 100*       Signed:  Xu,Fang MD, PhD  [facility] 08/01/2015, 1:01 PM

## 2015-08-01 NOTE — Clinical Social Work Note (Signed)
Patient medically stable for discharge back to [facility] today. Discharge information transmitted to facility and patient will be transported by ambulance back to [facility].  CSW attempted to reach Ms. Chester Holstein [phone]) and message left regarding discharge.   Genelle Bal, MSW, LCSW Licensed Clinical Social Worker Clinical Social Work Department [employer] [phone]

## 2015-08-01 NOTE — Progress Notes (Signed)
Speech Language Pathology Treatment: Dysphagia  Patient Details Name: Joseph Ponce MRN: [id number] DOB: 09/12/1924 Today's Date: 08/01/2015 Time: 1030-1050 SLP Time Calculation (min) (ACUTE ONLY): 20 min  Assessment / Plan / Recommendation Clinical Impression  SLP assessed patient's toleration with puree solids, thin liquids and nectar thick liquids. Patient tolerated nectar thick liquids with no overt s/s of aspiration. He exhibited mild delay in oral manipulation of puree bolus, but fully cleared oral cavity with no residuals post-swallow. Patient exhibited one cough response after sip of thin liquids from cup (controlled sips with SLP providing assistance), but did not exhibit any other overt s/s of aspiration for the other 7 trial sips. Still do not know patient's baseline with liquid consistencies from SNF where he was a resident.   HPI Other Pertinent Information: 81 y.o. male with PMH of dementia, hypertension, hyperlipidemia, depression, osteoporosis, stroke, dysphasia, PVD, COPD, BKA, renal artery stenosis,CKD-IV, who presents abnormal lab from SNF. No recent CXR. Found to have acute renal failure superimposed on stage 4 chronic kidney disease. MBS x 2 in 2007, results unavailable (MBS in imaging for 06/2015- no results found).   Pertinent Vitals Pain Assessment: No/denies pain  SLP Plan  Continue with current plan of care    Recommendations Diet recommendations: Dysphagia 1 (puree);Nectar-thick liquid Liquids provided via: Cup Medication Administration: Crushed with puree Supervision: Full supervision/cueing for compensatory strategies;Staff to assist with self feeding Compensations: Slow rate;Small sips/bites;Check for pocketing Postural Changes and/or Swallow Maneuvers: Seated upright 90 degrees;Upright 30-60 min after meal              Oral Care Recommendations: Oral care BID Follow up Recommendations: Skilled Nursing facility Plan: Continue with current plan of care    GO     Quame, [place] 08/01/2015, 3:35 PM

## 2015-08-04 ENCOUNTER — Non-Acute Institutional Stay (SKILLED_NURSING_FACILITY): Payer: Medicare Other | Admitting: Internal Medicine

## 2015-08-04 DIAGNOSIS — L89150 Pressure ulcer of sacral region, unstageable: Secondary | ICD-10-CM | POA: Diagnosis not present

## 2015-08-04 DIAGNOSIS — N189 Chronic kidney disease, unspecified: Secondary | ICD-10-CM | POA: Diagnosis not present

## 2015-08-04 DIAGNOSIS — N183 Chronic kidney disease, stage 3 (moderate): Secondary | ICD-10-CM

## 2015-08-04 DIAGNOSIS — E870 Hyperosmolality and hypernatremia: Secondary | ICD-10-CM | POA: Diagnosis not present

## 2015-08-04 DIAGNOSIS — J449 Chronic obstructive pulmonary disease, unspecified: Secondary | ICD-10-CM | POA: Diagnosis not present

## 2015-08-04 DIAGNOSIS — N179 Acute kidney failure, unspecified: Secondary | ICD-10-CM

## 2015-08-04 NOTE — Progress Notes (Signed)
Patient ID: Joseph Ponce, male   DOB: 10/15/1924, 81 y.o.   MRN: [id number] Facility; [name] Chief complaint; readmission to the facility post stay at [facility] 9/16 through 08/01/15 History; Joseph Ponce is an 81 year old man who is a long-standing resident of this facility. He has dementia chronic renal failure as his main ongoing medical issues. He has been on DDAVP for perhaps as long as a known him and has a tendency towards hypertension natremia that is managed with this. 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 He declined markedly over the course of 2-3 weeks prior to his admission to hospital. He developed acute on chronic renal failure with hypernatremia. As well as he developed an unstageable wound over his coccyx area with surrounding cellulitis. We are making an attempt to manage his in the facility with the IV fluid through a PICC line as well as IV linezolid and Zosyn however some lab work was called to one of our providers he did know the case he was sent to the hospital.  He was admitted with acute on chronic renal failure. He received IV fluid his creatinine peaked at 3.4 and was 2.37 at discharge. His admission sodium level was 159, got as high as 161. This was 147 at discharge A Foley catheter that we inserted to monitor urine output and also to protect the wound over his coccyx was left in place. Also he was noted to have significant hypernatremia which I believe was stopped on 9/19 according to the notes. I believe I did this a year or so ago and he developed hypernatremia again. He will need to be followed for this. With regards to his coccyx wound antibiotics were continued including vancomycin and Zosyn. An MRI of the coccyx showed been no evidence of osteomyelitis. He did have left gluteus muscle edema. He was seen by general surgery and not felt to be a surgical candidate. He was transfused. Fecal occult blood was negative for. Arm iron was 28 ferritin  608 B12 and folate were within normal limits. This was felt to be anemia of chronic disease.  Past Medical History  Diagnosis Date  . Dementia   . Muscle weakness   . Osteoporosis   . Alzheimer disease   . Failure to thrive in adult   . Dementia   . CVA (cerebral vascular accident)     speech and language d/o deficits  . Weakness   . Hyperlipemia   . Dysphagia   . Anemia   . PVD (peripheral vascular disease)   . COPD (chronic obstructive pulmonary disease)   . Glaucoma (increased eye pressure)   . Hypertension   . S/P BKA (below knee amputation) unilateral 01/21/2006    left  . Renal artery stenosis   . CKD (chronic kidney disease), stage IV    Past Surgical History  Procedure Laterality Date  . Leg amputation below knee      left    BMP Latest Ref Rng 08/01/2015 07/31/2015 07/30/2015  Glucose 65 - 99 mg/dL 119(J) 75 81  BUN 6 - 20 mg/dL 47(W) 29(F) 62(Z)  Creatinine 0.61 - 1.24 mg/dL 3.08(M) 5.78(I) 6.96(E)  Sodium 135 - 145 mmol/L 147(H) 143 142  Potassium 3.5 - 5.1 mmol/L 4.3 3.4(L) 3.3(L)  Chloride 101 - 111 mmol/L 115(H) 114(H) 114(H)  CO2 22 - 32 mmol/L Calcium 8.9 - 10.3 mg/dL 95.2 9.3 9.Joseph ShadowensLatest Ref Rng 08/01/2015 07/31/2015 07/30/2015  WBC 4.0 -  10.5 K/uL 12.8(H) 11.8(H) 10.8(H)  Hemoglobin 13.0 - 17.0 g/dL 4.5(W) 0.9(W) 1.1(B)  Hematocrit 39.0 - 52.0 % 28.5(L) 28.9(L) 27.7(L)  Platelets 150 - 400 K/uL 289 257 270   Social history; we had talked to the family before he went out and they wanted aggressive care [this is a niece who lives in [place]. I see he comes back from the hospital with a signed DO NOT RESUSCITATE  reports that he quit smoking about 3 months ago. He does not have any smokeless tobacco history on file. He reports that he does not drink alcohol or use illicit drugs.  indicated that his mother is deceased. He indicated that his father is deceased. He indicated that his sister is deceased. He indicated that his brother is  deceased.   Current medications All of his medications were discontinued he is only on lorazepam 2 mg per mL  solution orally 0.6 mg every 8 hours as needed and morphine concentrate 10 mg per 0.5 ML 5 mg every 2 when necessary  Review of systems; not possible from this man secondary to advanced dementia.  Physical examination Gen. patient is awake conversational but restless and combative. HEENT he will not let me look in his mouth. Respiratory shallow air entry bilaterally compatible with known COPD Cardiac heart sounds are irregular but not tachycardic. He already appears somewhat volume contracted Abdomen; virtually impossible to examine as he was too combative however there was no obvious masses or tenderness GU Foley catheter in place Extremities; he has several pressure areas on the lateral aspect of his remaining right foot. There is a small open area on the lateral aspect of his heel. This was there before he went to the hospital. Skin; the advertised stage III pressure ulcer on his coccyx from the discharge summary is an unstageable area. The erythema and tenderness around this before his antibiotics were started is actually quite a bit better Neurologic; no lateralizing neurologic signs  Impression/plan #1 acute on chronic renal failure. That he is advertised as having renal vascular disease. His baseline creatinine is around 2. He was improving towards this value when he left the hospital #2 hypernatremia; history of diabetes insipidus his DDAVP has been stopped. His sodium was already going up before he left the hospital. I actually had trialed this I think 2 years ago and the sodium went up into the 150s before restarting the DDAVP. #3 unstageable area over his coccyx which is quite large. This is covered with an eschar. The infection that was present before he went out is improved. As he is now comfort care I don't think debridement is indicated #4 several pressure areas over the  lateral aspect of his foot he has a bunny boot in place #5 COPD; this does not appear to be unstable. #6 severe dementia he is restless and combative at the moment.  Patient is now off all his medications only on when necessary Ativan orally and him morphine concentrate. This would make him comfort care. All have the staff contact his responsible party and make sure all of this was properly discussed in the hospital. It is true he certainly would qualify for hospice

## 2015-08-12 ENCOUNTER — Non-Acute Institutional Stay (SKILLED_NURSING_FACILITY): Payer: Medicare Other | Admitting: Internal Medicine

## 2015-08-12 DIAGNOSIS — E870 Hyperosmolality and hypernatremia: Secondary | ICD-10-CM | POA: Diagnosis not present

## 2015-08-12 DIAGNOSIS — N179 Acute kidney failure, unspecified: Secondary | ICD-10-CM | POA: Diagnosis not present

## 2015-08-12 DIAGNOSIS — N189 Chronic kidney disease, unspecified: Secondary | ICD-10-CM | POA: Diagnosis not present

## 2015-08-12 NOTE — Progress Notes (Signed)
Patient ID: Joseph Ponce, male   DOB: 02/15/1924, 81 y.o.   MRN: [id number] Facility; [name] Chief complaint; end-of-life issues. History; this is a patient who I readmitted to the building last week after a stay at [facility] with acute on chronic renal failure and infected decubitus ulcer over his sacrum. When I saw him in follow-up here he had already been picked up with from hospice. All of his medications at been discontinued and he had been put on morphine and Ativan. I was really not involved in this discussion today he is virtually unresponsive. According to staff he has eaten virtually nothing although his family said they're able to get applesauce into him the last time they visited.   Physical examination Gen. patient has his eyes open however he is unresponsive. Vitals respiratory rate 8/m pulse 64 Respiratory shallow air entry bilaterally Cardiac marked dehydration  Impression/plan #1 acute on chronic renal failure this patient is clearly at an end-of-life stage. #2 chronic hypernatremia presumably central diabetes insipidus. The hypernatremia could be contributing to his altered state however that he does not appear to become uncomfortable. #3 infected ulcer over his sacrum. The last I saw this this actually look quite a bit better after he came back from the hospital. He was not deemed to be a surgical candidate. It is clearly a moot point at this point

## 2015-09-09 DEATH — deceased

## 2015-09-23 IMAGING — US US RENAL
1 series · 14 of 25 positions shown · non-contrast
Comparison: None.

CLINICAL DATA: 89-year-old male with acute renal insufficiency.

EXAM:
RENAL / URINARY TRACT ULTRASOUND COMPLETE

[Series 1: us renal · 0.25mm/px · 14 of 38 slices shown]
[im 1/38]
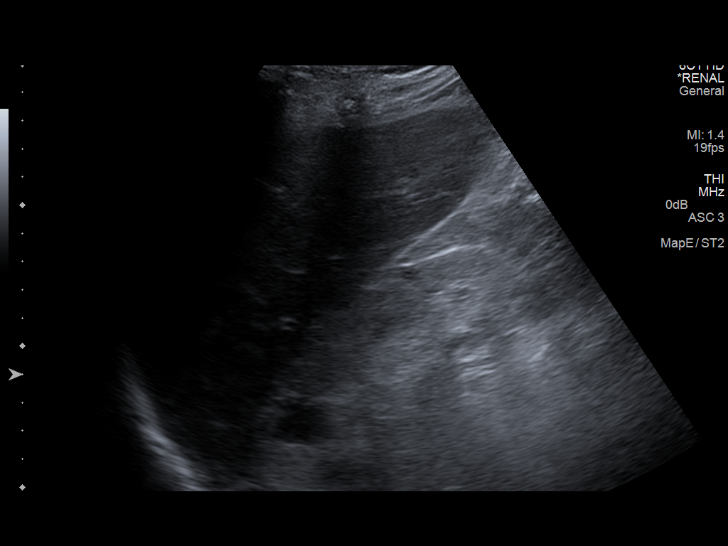
[im 4/38]
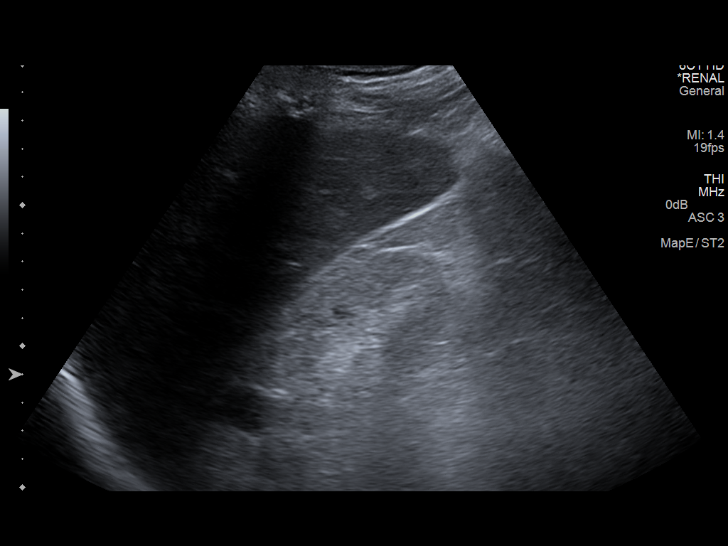
[im 7/38]
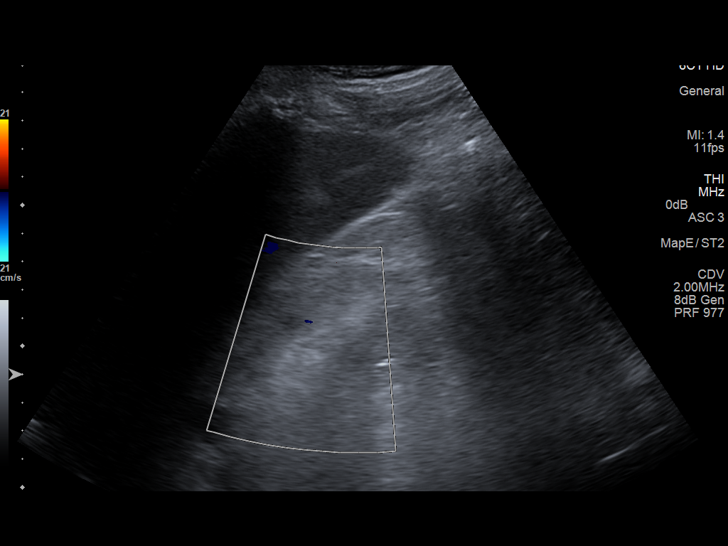
[im 10/38]
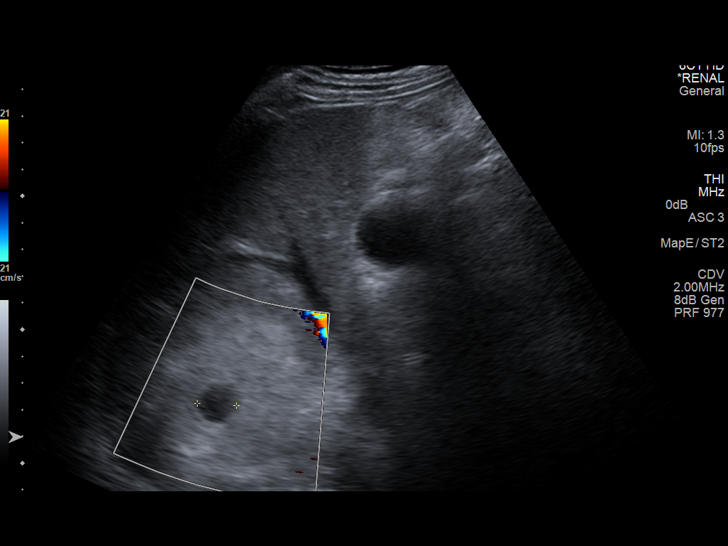
[im 13/38]
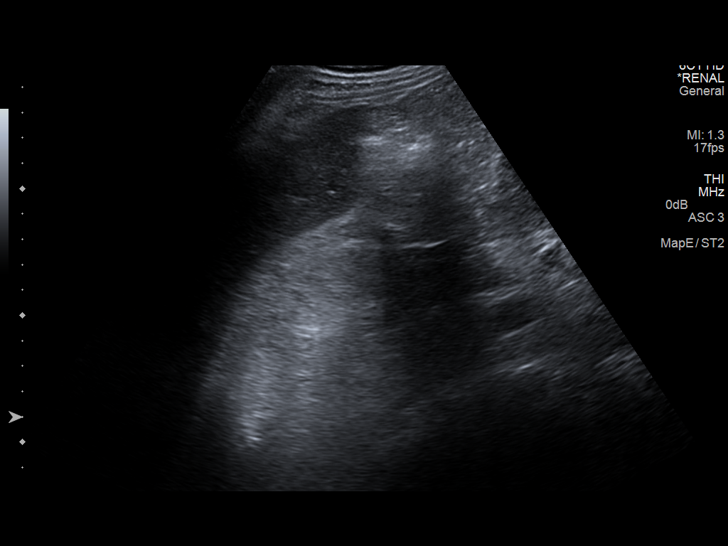
[im 14/38]
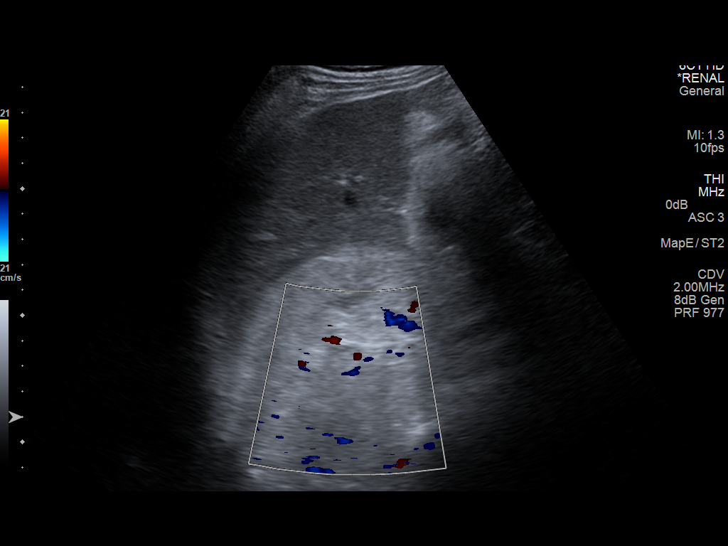
[im 17/38]
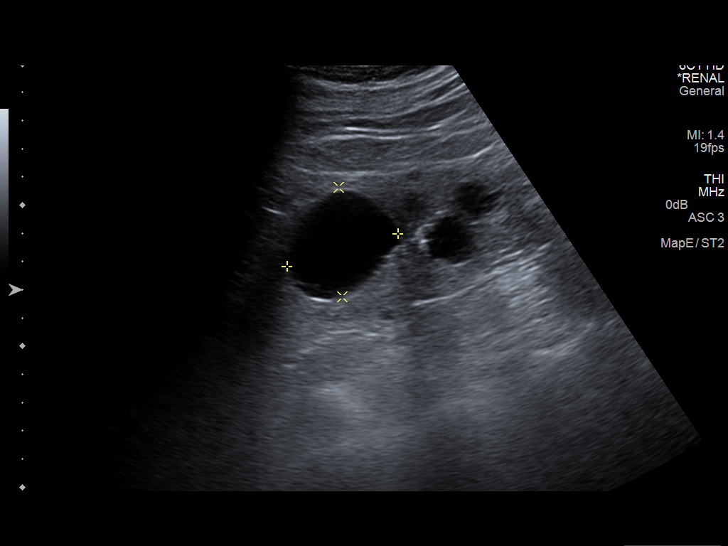
[im 21/38]
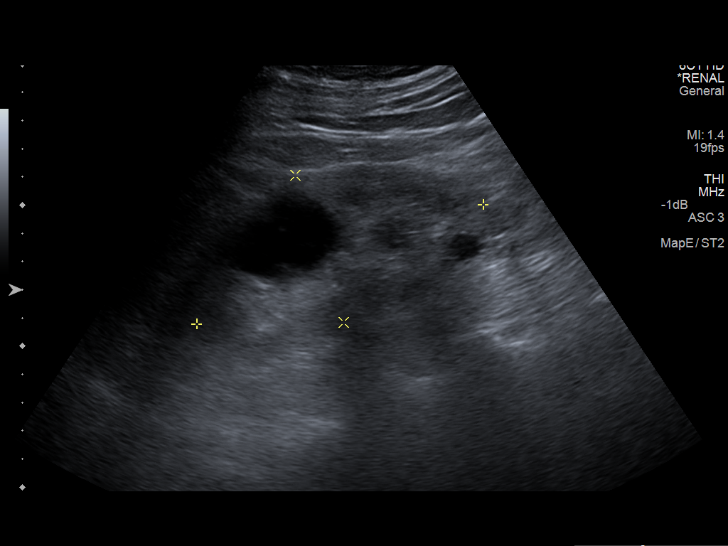
[im 24/38]
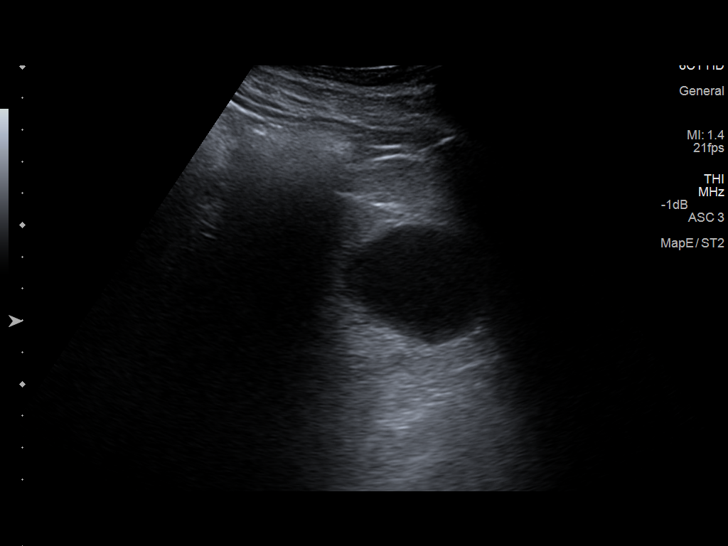
[im 25/38]
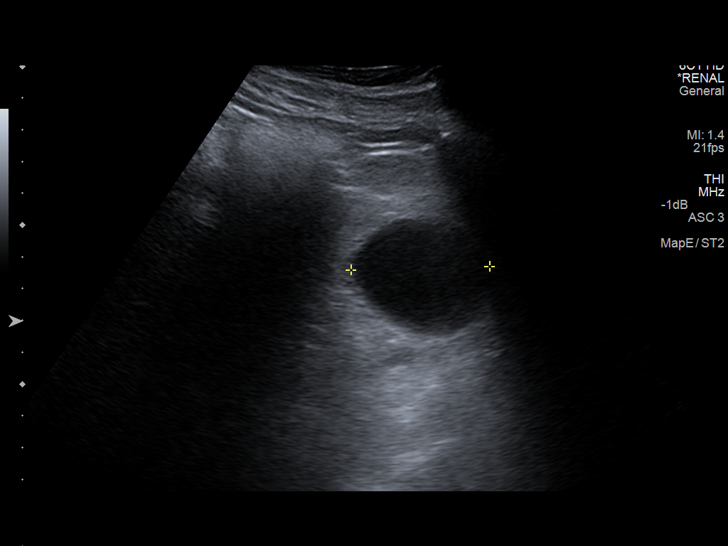
[im 28/38]
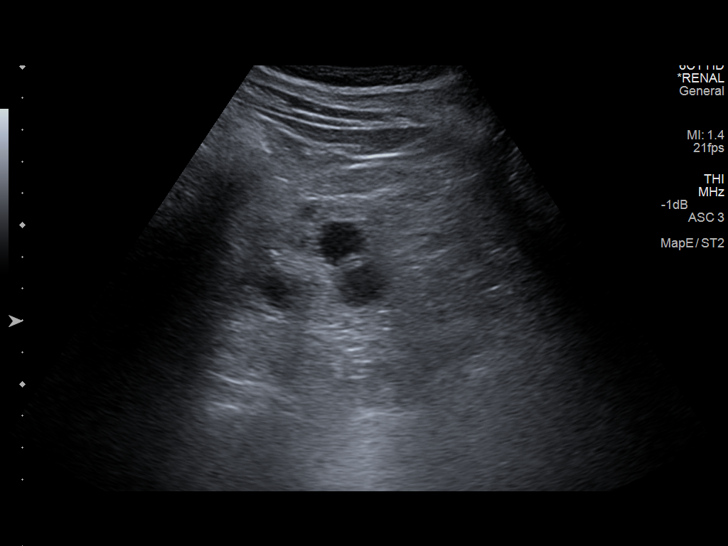
[im 31/38]
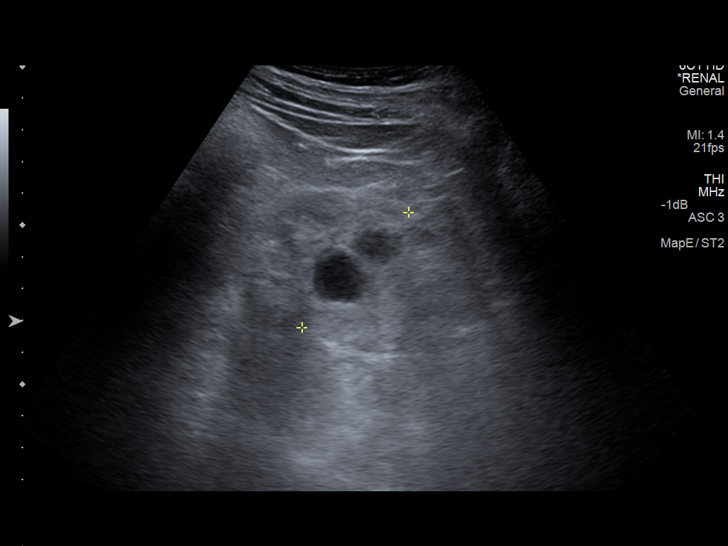
[im 34/38]
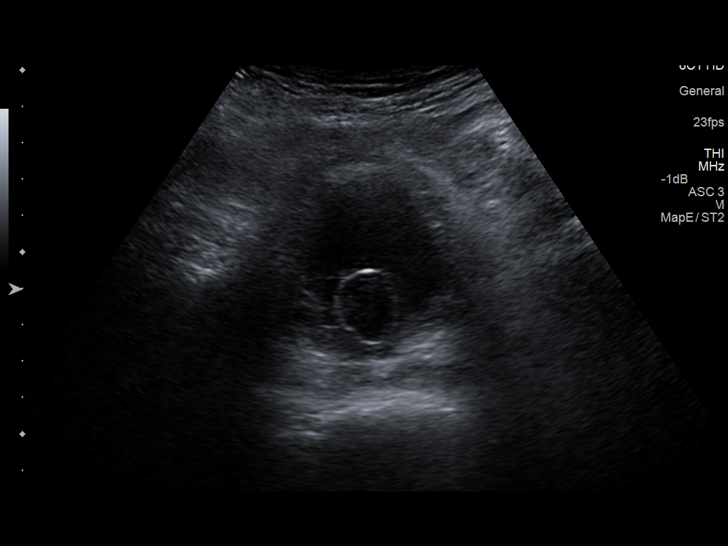
[im 38/38]
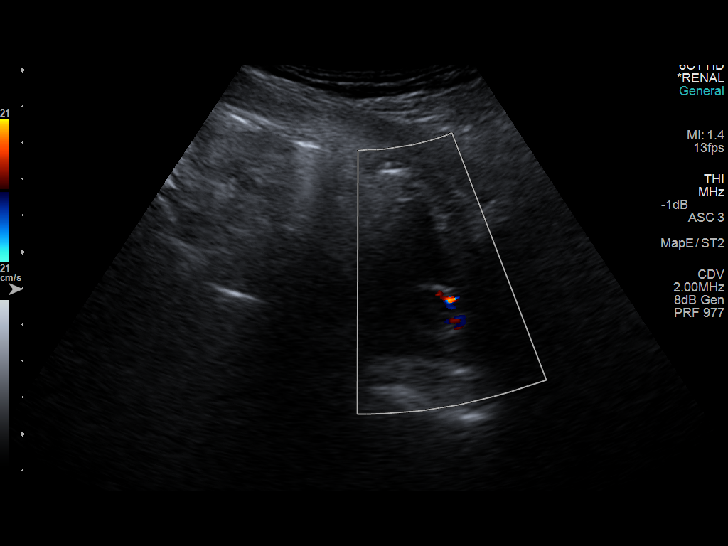

[14 of 25 positions shown; findings below may reference images not displayed]

FINDINGS: Right Kidney:

Length: 11 cm. Diffuse increased renal echogenicity compatible with
underlying medical renal disease. Clinical correlation is
recommended. A 2.2 x 1.5 x 1.9 cm upper pole cyst is seen. There is
no hydronephrosis. Evaluation for stone or underlying solid lesion
is limited due to increased echogenicity.

Left Kidney:

Length: 11 cm. There are multiple cysts in the left kidney measuring
up to 4.1 x 4.4 x 3.9 cm in the upper pole. There is diffuse
increased renal parenchymal echogenicity. No hydronephrosis.

Bladder:

A Foley catheter is noted within the partially distended urinary
bladder.
IMPRESSION: Increased bilateral renal echogenicity likely related side and
aligned medical renal disease. Clinical correlation is recommended.

No hydronephrosis.

Bilateral renal cysts.

## 2016-12-14 IMAGING — CT CT L SPINE W/O CM
3 of 7 series · 12 of 33 positions shown, 14 images · non-contrast
Comparison: None.

CLINICAL DATA: Extreme back pain, in addition to pressure ulcer.
History of dementia, chronic kidney disease, below-knee amputation.

EXAM:
CT LUMBAR SPINE WITHOUT CONTRAST
TECHNIQUE: Multidetector CT imaging of the lumbar spine was performed without
intravenous contrast administration. Multiplanar CT image
reconstructions were also generated.

[Series 4: l spine 2.0 i30s 3 · axial · 0.30mm/px · z∈[+1161,+1377]mm · 6 of 140 slices shown, 8 images]
[im 16/140  soft-tissue]
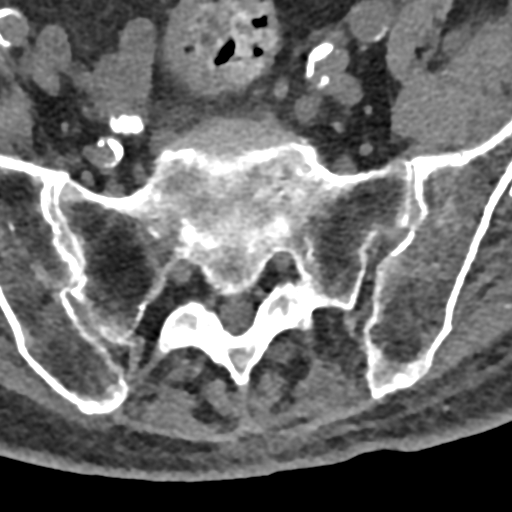
[im 16/140  bone]
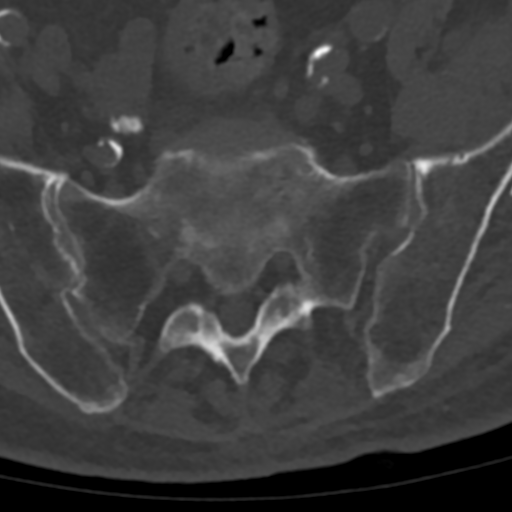
[im 47/140  bone]
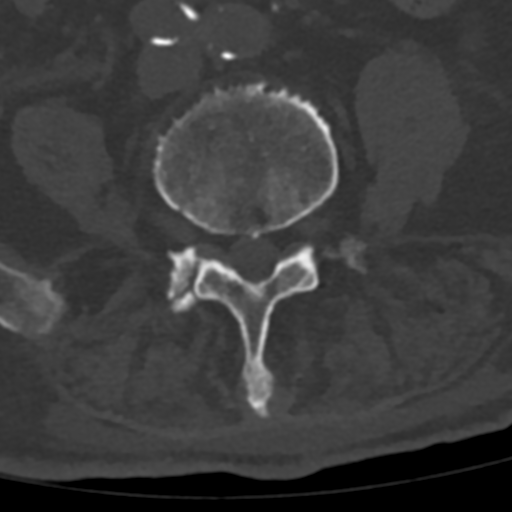
[im 62/140  bone]
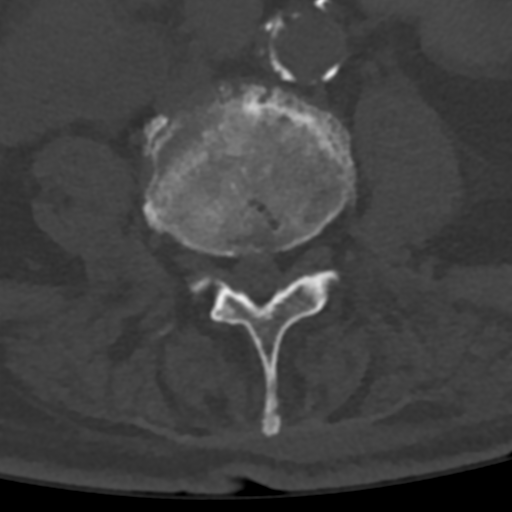
[im 78/140  bone]
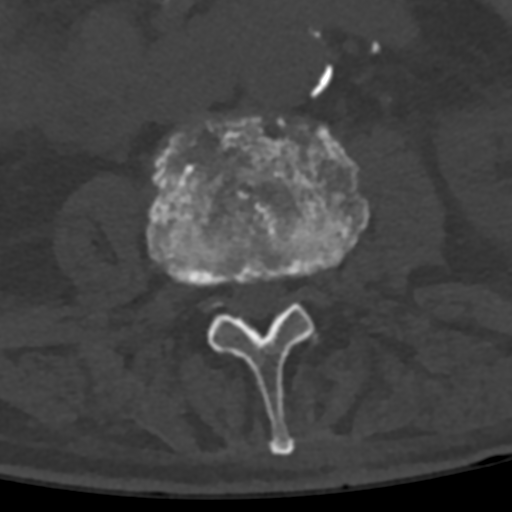
[im 109/140  soft-tissue]
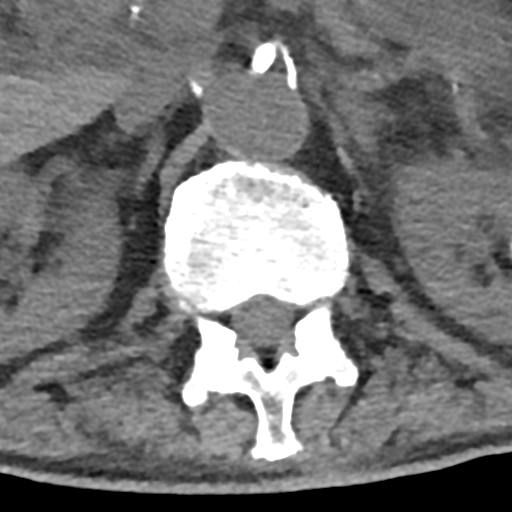
[im 109/140  bone]
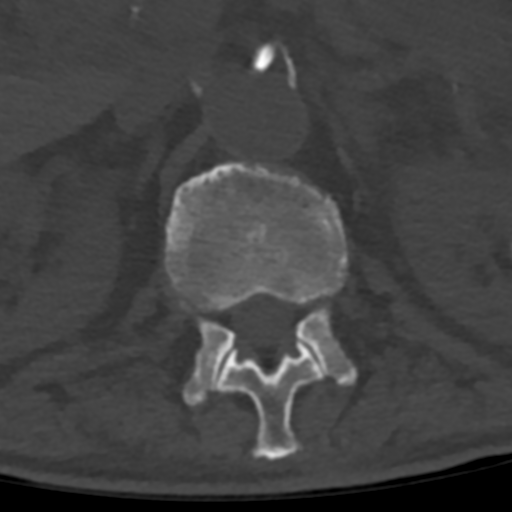
[im 124/140  bone]
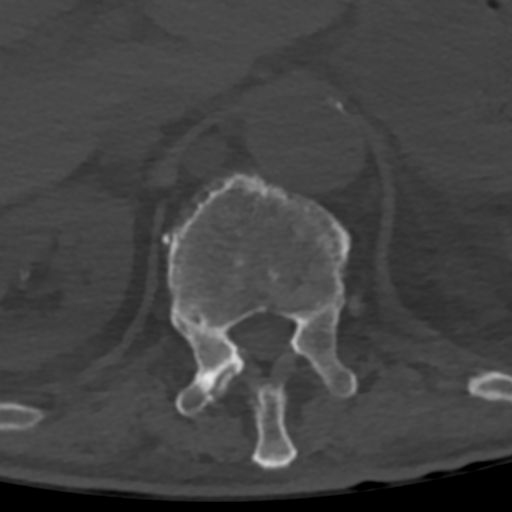

[Series 7: coronal st · coronal · 0.41mm/px · 1 of 66 slices shown]
[im 33/66  bone]
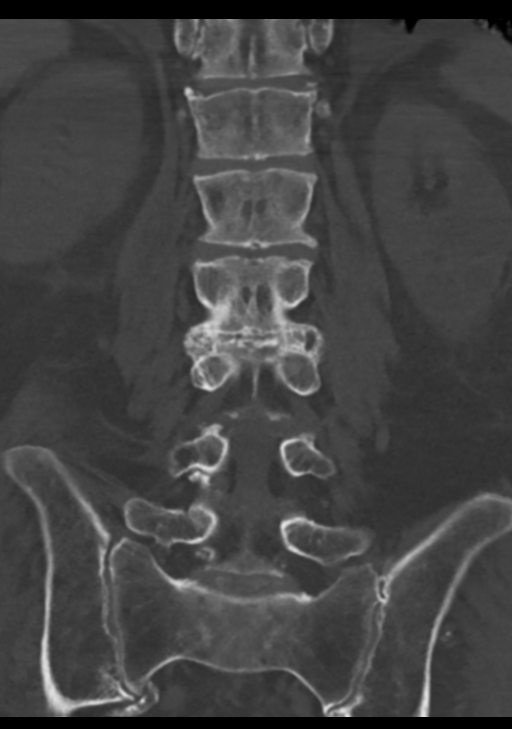

[Series 11: l spine 2.0 mpr sag · sagittal · 0.18mm/px · 5 of 78 slices shown]
[im 12/78  bone]
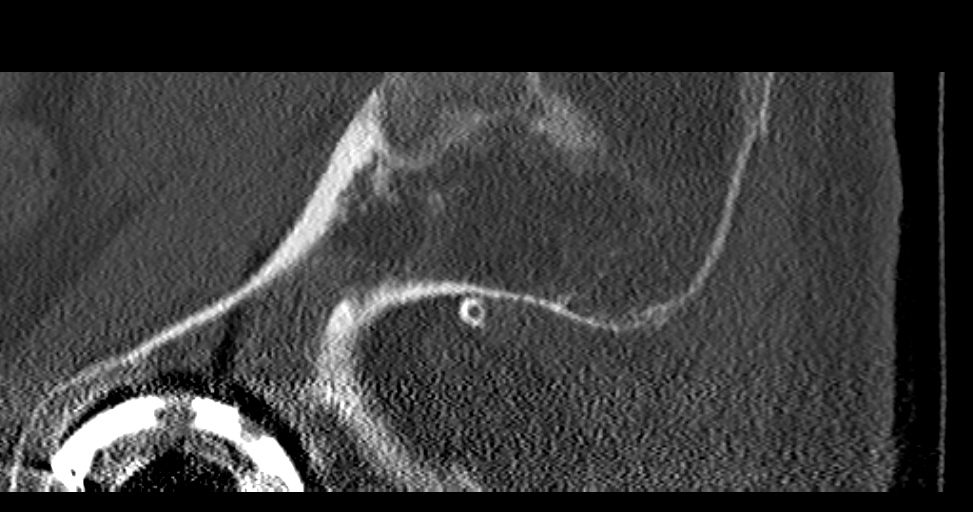
[im 23/78  bone]
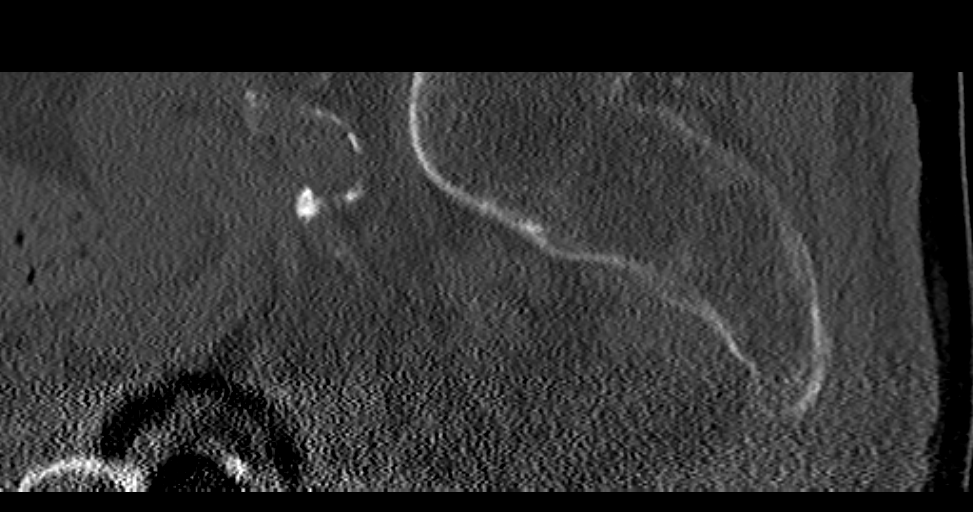
[im 34/78  bone]
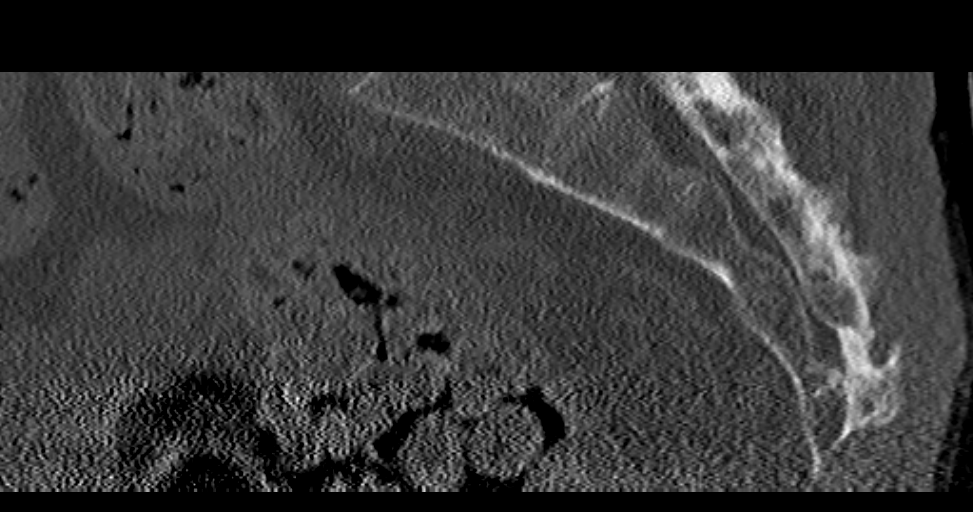
[im 45/78  bone]
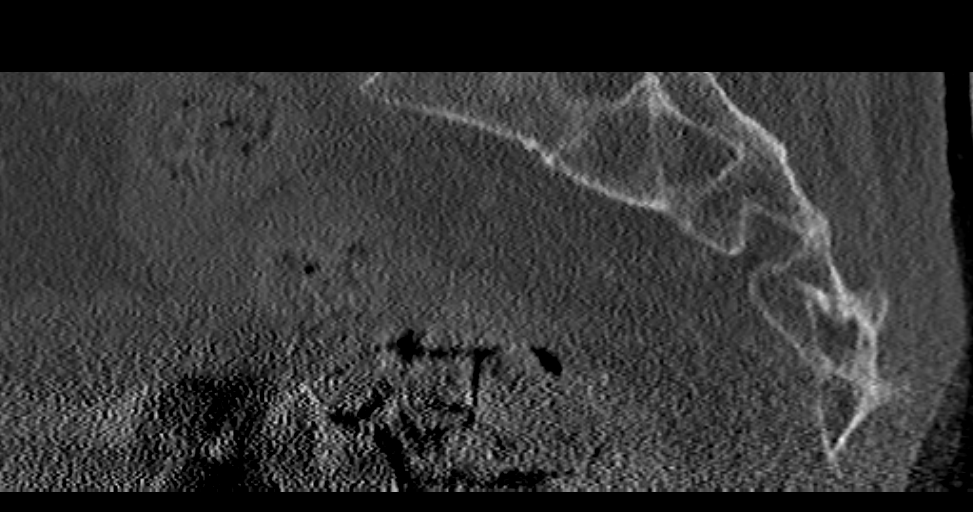
[im 56/78  bone]
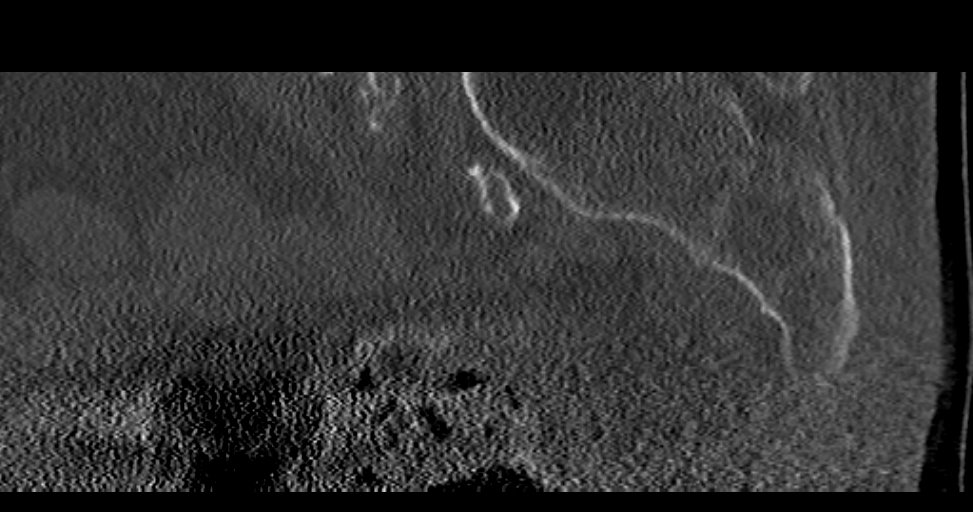

[12 of 33 positions shown; findings below may reference images not displayed]

FINDINGS: Lumbar vertebral bodies and posterior elements are intact and
aligned and maintenance of lumbar lordosis. Using the reference
level of the last well-formed intervertebral disc as L5-S1, moderate
L2-3 through L4-5 disc height loss, intradiscal calcification.
Vacuum disc at L3-4. Probable auto body intern arthrodesis at L2-3.
Mild endplate spurring L1-2 through L4-5. Mild osteopenia without
destructive bony lesions. Small probable enchondroma RIGHT iliac
bone. Status post bilateral hip total arthroplasties.

Partially imaged small pleural effusions. 3.3 cm LEFT renal cyst.
Vascular calcifications versus nonobstructing RIGHT nephrolithiasis.
Large amount of stool distending the sigmoid colon and rectum.

Small broad-based disc osteophyte complex at L2-3 results in mild
canal stenosis. Mild to moderate RIGHT L4-5 and L5-S1 osseous neural
foraminal narrowing.
IMPRESSION: No acute lumbar spine fracture, malalignment.

Mild canal stenosis at L2-3. Mild to moderate RIGHT L4-5 and L5-S1
neural foraminal narrowing.
